# Patient Record
Sex: Female | Born: 1945 | Race: White | Hispanic: No | Smoking: Never smoker
Health system: Southern US, Community
[De-identification: ages and names within clinical notes are randomized; demographics above are authoritative.]

## PROBLEM LIST (undated history)

## (undated) DIAGNOSIS — E119 Type 2 diabetes mellitus without complications: Secondary | ICD-10-CM

## (undated) DIAGNOSIS — K8689 Other specified diseases of pancreas: Secondary | ICD-10-CM

## (undated) DIAGNOSIS — D649 Anemia, unspecified: Secondary | ICD-10-CM

## (undated) DIAGNOSIS — T783XXA Angioneurotic edema, initial encounter: Secondary | ICD-10-CM

## (undated) DIAGNOSIS — M1711 Unilateral primary osteoarthritis, right knee: Secondary | ICD-10-CM

## (undated) DIAGNOSIS — Z9889 Other specified postprocedural states: Secondary | ICD-10-CM

## (undated) DIAGNOSIS — R350 Frequency of micturition: Secondary | ICD-10-CM

## (undated) DIAGNOSIS — N393 Stress incontinence (female) (male): Secondary | ICD-10-CM

## (undated) DIAGNOSIS — K219 Gastro-esophageal reflux disease without esophagitis: Secondary | ICD-10-CM

## (undated) DIAGNOSIS — M1990 Unspecified osteoarthritis, unspecified site: Secondary | ICD-10-CM

## (undated) DIAGNOSIS — T7840XA Allergy, unspecified, initial encounter: Secondary | ICD-10-CM

## (undated) DIAGNOSIS — Z973 Presence of spectacles and contact lenses: Secondary | ICD-10-CM

## (undated) DIAGNOSIS — I499 Cardiac arrhythmia, unspecified: Secondary | ICD-10-CM

## (undated) DIAGNOSIS — R112 Nausea with vomiting, unspecified: Secondary | ICD-10-CM

## (undated) DIAGNOSIS — R3915 Urgency of urination: Secondary | ICD-10-CM

## (undated) DIAGNOSIS — I10 Essential (primary) hypertension: Secondary | ICD-10-CM

## (undated) HISTORY — PX: JOINT REPLACEMENT: SHX530

## (undated) HISTORY — DX: Allergy, unspecified, initial encounter: T78.40XA

## (undated) HISTORY — PX: COLONOSCOPY W/ BIOPSIES AND POLYPECTOMY: SHX1376

## (undated) HISTORY — DX: Angioneurotic edema, initial encounter: T78.3XXA

## (undated) HISTORY — PX: DILATION AND CURETTAGE OF UTERUS: SHX78

## (undated) HISTORY — DX: Gastro-esophageal reflux disease without esophagitis: K21.9

## (undated) HISTORY — DX: Essential (primary) hypertension: I10

## (undated) HISTORY — DX: Unspecified osteoarthritis, unspecified site: M19.90

## (undated) HISTORY — DX: Unilateral primary osteoarthritis, right knee: M17.11

---

## 2001-09-05 ENCOUNTER — Encounter: Payer: Self-pay | Admitting: Obstetrics and Gynecology

## 2001-09-05 ENCOUNTER — Encounter: Admission: RE | Admit: 2001-09-05 | Discharge: 2001-09-05 | Payer: Self-pay | Admitting: Obstetrics and Gynecology

## 2001-09-07 ENCOUNTER — Other Ambulatory Visit: Admission: RE | Admit: 2001-09-07 | Discharge: 2001-09-07 | Payer: Self-pay | Admitting: Obstetrics and Gynecology

## 2001-09-11 ENCOUNTER — Encounter: Admission: RE | Admit: 2001-09-11 | Discharge: 2001-09-11 | Payer: Self-pay | Admitting: Obstetrics and Gynecology

## 2001-09-11 ENCOUNTER — Encounter: Payer: Self-pay | Admitting: Obstetrics and Gynecology

## 2002-10-11 ENCOUNTER — Encounter: Payer: Self-pay | Admitting: Obstetrics and Gynecology

## 2002-10-11 ENCOUNTER — Encounter: Admission: RE | Admit: 2002-10-11 | Discharge: 2002-10-11 | Payer: Self-pay | Admitting: Obstetrics and Gynecology

## 2002-10-24 ENCOUNTER — Other Ambulatory Visit: Admission: RE | Admit: 2002-10-24 | Discharge: 2002-10-24 | Payer: Self-pay | Admitting: Obstetrics and Gynecology

## 2004-11-04 ENCOUNTER — Other Ambulatory Visit: Admission: RE | Admit: 2004-11-04 | Discharge: 2004-11-04 | Payer: Self-pay | Admitting: Obstetrics and Gynecology

## 2005-02-01 ENCOUNTER — Encounter: Admission: RE | Admit: 2005-02-01 | Discharge: 2005-02-01 | Payer: Self-pay | Admitting: Obstetrics and Gynecology

## 2005-10-05 ENCOUNTER — Encounter: Admission: RE | Admit: 2005-10-05 | Discharge: 2005-10-05 | Payer: Self-pay | Admitting: Emergency Medicine

## 2007-10-02 ENCOUNTER — Encounter: Admission: RE | Admit: 2007-10-02 | Discharge: 2007-10-02 | Payer: Self-pay | Admitting: Obstetrics and Gynecology

## 2008-03-22 HISTORY — PX: TOTAL KNEE ARTHROPLASTY: SHX125

## 2008-08-14 ENCOUNTER — Emergency Department (HOSPITAL_COMMUNITY): Admission: EM | Admit: 2008-08-14 | Discharge: 2008-08-15 | Payer: Self-pay | Admitting: Emergency Medicine

## 2008-10-07 ENCOUNTER — Inpatient Hospital Stay (HOSPITAL_COMMUNITY): Admission: RE | Admit: 2008-10-07 | Discharge: 2008-10-09 | Payer: Self-pay | Admitting: Orthopedic Surgery

## 2010-04-11 ENCOUNTER — Encounter: Payer: Self-pay | Admitting: Obstetrics and Gynecology

## 2010-06-28 LAB — ABO/RH: ABO/RH(D): A NEG

## 2010-06-28 LAB — URINE CULTURE
Colony Count: NO GROWTH
Culture: NO GROWTH

## 2010-06-28 LAB — TYPE AND SCREEN

## 2010-06-28 LAB — URINALYSIS, ROUTINE W REFLEX MICROSCOPIC
Glucose, UA: NEGATIVE mg/dL
Hgb urine dipstick: NEGATIVE
Ketones, ur: NEGATIVE mg/dL
Protein, ur: NEGATIVE mg/dL
pH: 7.5 (ref 5.0–8.0)

## 2010-06-28 LAB — CBC
HCT: 30.4 % — ABNORMAL LOW (ref 36.0–46.0)
HCT: 40.7 % (ref 36.0–46.0)
Hemoglobin: 10.4 g/dL — ABNORMAL LOW (ref 12.0–15.0)
Hemoglobin: 10.5 g/dL — ABNORMAL LOW (ref 12.0–15.0)
Hemoglobin: 14 g/dL (ref 12.0–15.0)
MCHC: 35.3 g/dL (ref 30.0–36.0)
MCV: 85.8 fL (ref 78.0–100.0)
RBC: 3.45 MIL/uL — ABNORMAL LOW (ref 3.87–5.11)
RBC: 4.69 MIL/uL (ref 3.87–5.11)
RDW: 13.5 % (ref 11.5–15.5)
WBC: 10 10*3/uL (ref 4.0–10.5)
WBC: 12.2 10*3/uL — ABNORMAL HIGH (ref 4.0–10.5)

## 2010-06-28 LAB — BASIC METABOLIC PANEL
CO2: 30 mEq/L (ref 19–32)
Calcium: 8.3 mg/dL — ABNORMAL LOW (ref 8.4–10.5)
Chloride: 103 mEq/L (ref 96–112)
GFR calc Af Amer: >60 mL/min (ref >60)
GFR calc Af Amer: >60 mL/min (ref >60)
GFR calc non Af Amer: >60 mL/min (ref >60)
Potassium: 4.7 mEq/L (ref 3.5–5.1)
Sodium: 139 mEq/L (ref 135–145)
Sodium: 139 mEq/L (ref 135–145)

## 2010-06-28 LAB — DIFFERENTIAL
Basophils Absolute: 0.1 10*3/uL (ref 0.0–0.1)
Basophils Relative: 1 % (ref 0–1)
Eosinophils Absolute: 0.1 10*3/uL (ref 0.0–0.7)
Monocytes Relative: 6 % (ref 3–12)
Neutro Abs: 6.5 10*3/uL (ref 1.7–7.7)
Neutrophils Relative %: 74 % (ref 43–77)

## 2010-06-28 LAB — COMPREHENSIVE METABOLIC PANEL
ALT: 17 U/L (ref 0–35)
Alkaline Phosphatase: 88 U/L (ref 39–117)
BUN: 10 mg/dL (ref 6–23)
CO2: 29 mEq/L (ref 19–32)
GFR calc non Af Amer: >60 mL/min (ref >60)
Glucose, Bld: 102 mg/dL — ABNORMAL HIGH (ref 70–99)
Potassium: 4.4 mEq/L (ref 3.5–5.1)
Total Bilirubin: 0.6 mg/dL (ref 0.3–1.2)
Total Protein: 6.6 g/dL (ref 6.0–8.3)

## 2010-06-28 LAB — PROTIME-INR
INR: 2 — ABNORMAL HIGH (ref 0.00–1.49)
Prothrombin Time: 13.1 seconds (ref 11.6–15.2)

## 2010-06-28 LAB — HEMOGLOBIN A1C
Hgb A1c MFr Bld: 5.9 % (ref 4.6–6.1)
Mean Plasma Glucose: 123 mg/dL

## 2010-06-30 LAB — DIFFERENTIAL
Basophils Absolute: 0 10*3/uL (ref 0.0–0.1)
Basophils Relative: 0 % (ref 0–1)
Lymphocytes Relative: 14 % (ref 12–46)
Monocytes Absolute: 0.8 10*3/uL (ref 0.1–1.0)
Neutro Abs: 8.5 10*3/uL — ABNORMAL HIGH (ref 1.7–7.7)
Neutrophils Relative %: 78 % — ABNORMAL HIGH (ref 43–77)

## 2010-06-30 LAB — BASIC METABOLIC PANEL
CO2: 27 mEq/L (ref 19–32)
Calcium: 9.2 mg/dL (ref 8.4–10.5)
Creatinine, Ser: 0.83 mg/dL (ref 0.4–1.2)
GFR calc Af Amer: >60 mL/min (ref >60)
GFR calc non Af Amer: >60 mL/min (ref >60)
Glucose, Bld: 109 mg/dL — ABNORMAL HIGH (ref 70–99)
Sodium: 138 mEq/L (ref 135–145)

## 2010-06-30 LAB — POCT CARDIAC MARKERS
CKMB, poc: <1 ng/mL — ABNORMAL LOW (ref 1.0–8.0)
Myoglobin, poc: 39.2 ng/mL (ref 12–200)

## 2010-06-30 LAB — CBC
Hemoglobin: 12.8 g/dL (ref 12.0–15.0)
MCHC: 35.3 g/dL (ref 30.0–36.0)
RBC: 4.23 MIL/uL (ref 3.87–5.11)
RDW: 13.1 % (ref 11.5–15.5)

## 2010-08-04 NOTE — Op Note (Signed)
NAMEHASANA, ALCORTA            ACCOUNT NO.:  [id number]   MEDICAL RECORD NO.:  [id number]          PATIENT TYPE:  INP   LOCATION:  5041                         FACILITY:  [facility]   PHYSICIAN:  Elana Alm. Thurston Hole, M.D. DATE OF BIRTH:  11-09-1945   DATE OF PROCEDURE:  10/07/2008  DATE OF DISCHARGE:                               OPERATIVE REPORT   PREOPERATIVE DIAGNOSIS:  Left knee degenerative joint disease.   POSTOPERATIVE DIAGNOSIS:  Left knee degenerative joint disease.   PROCEDURE:  Left total knee replacement using DePuy cemented total knee  system with #2.5 cemented femur, #2.5 cemented tibia with 12.5-mm  polyethylene RP tibial spacer, and 35-mm polyethylene cemented patella.   SURGEON:  Elana Alm. Thurston Hole, MD   ASSISTANT:  Julien Girt, PA   ANESTHESIA:  General.   OPERATIVE TIME:  One hour and 30 minutes.   COMPLICATIONS:  None.   DESCRIPTION OF PROCEDURE:  Ms. Deblasi is brought to the operating room  on October 07, 2008, after a femoral nerve block was placed in the holding  by Anesthesia.  She was placed on the operative table in supine  position.  She received Ancef 2 g IV preoperatively for prophylaxis.  After being placed under general anesthesia, she had a Foley catheter  placed under sterile conditions.  Her left knee was examined.  Range of  motion from -5 to 125 degrees.  Moderate varus deformity in knee.  Stable ligamentous exam with normal patellar tracking.  Left leg was  prepped using sterile DuraPrep and draped using sterile technique.  Time-  out technique was called and utilized judiciously.  At this point then,  the leg was exsanguinated and tourniquet elevated to 365 mm.  Initially,  through a 15-cm longitudinal incision based over the patella, initial  exposure was made.  The underlying subcutaneous tissues were incised  along with skin incision.  A median arthrotomy was performed revealing  excessive amount of normal-appearing joint fluid.  The  articular  surfaces were inspected.  She was found to have grade 4 changes  medially, grade 3 and 4 changes laterally, and grade 3 and 4 changes in  the patellofemoral joint.  Osteophytes were removed from the femoral  condyles and tibial plateau.  The medial and lateral meniscal remnants  were removed as well as the anterior cruciate ligament.  Intramedullary  drill was then drilled up the femoral canal for placement of distal  femoral cutting jig which was placed in the appropriate manner of  rotation.  A distal 11-mm cut was made.  The distal femur was incised.  The #2.5 was found be the appropriate size.  The #2.5 cutting jig was  placed on the distal femur in the appropriate manner of external  rotation and then these cuts were made.  The proximal tibia was then  exposed.  The tibial spines were removed with an oscillating saw.  Intramedullary drill was drilled down the tibial canal for placement of  the proximal tibial cutting jig which was placed in the appropriate  manner of rotation, and a proximal 6-mm cut was made based off the  medial  or lower side.  Spacer blocks were then placed in flexion and  extension.  A 2.5-mm blocks gave excellent stability, excellent  balancing, and excellent correction of her flexion and varus  deformities.  At this point, the #2.5 tibial base plate trial was placed  on the cut tibial surface with an excellent fit and a keel cut was made.  The PCL box cutter was then placed on the distal femur and these cuts  were made.  At this point, the #2.5 femoral trial was placed, and with  the #2.5 tibial base plate trial and a 12.5-mm polyethylene RP tibial  spacer, knee was reduced and taken through range of motion from 0-125  degrees with excellent stability and excellent correction of her flexion  and varus deformities and normal patellar tracking.  An 8-mm resurfacing  cut was then made on the patella and 3 locking holes placed for a 35-mm  patellar  trial.  The patellar trial was placed, and again patellofemoral  tracking was evaluated and found to be normal.  At this point, it was  felt that all the trial components were excellent size, fit, and  stability.  They were then removed.  The knee was then jet lavaged and  irrigated with 3 L of saline.  The proximal tibia was then exposed.  The  #2.5 tibial baseplate with cemented back and was hammered into position  with an excellent fit with excess cement being removed from around the  edges.  The #2.5 femoral component was cemented back and was hammered  into position also with an excellent fit with excess cement being  removed from around the edges.  The 12.5-mm polyethylene RP tibial  spacer was placed on the tibial baseplate and the knee reduced, taken  through a range of motion from 0-125 degrees with excellent stability  and excellent correction of her flexion and varus deformities.  The 35-  mm polyethylene cemented back to the patella, it was then placed in its  position and held there with a clamp.  After the cement hardened again,  patellofemoral tracking was evaluated and found to be normal.  At this  point, it was felt that all the components were of excellent size, fit,  and stability.  The wound was further irrigated with saline.  The  tourniquet was then released.  Hemostasis obtained with cautery.  The  arthrotomy was then closed with #1 Ethibond suture over 2 medium Hemovac  drains.  Subcutaneous tissues closed with 0 and 2-0 Vicryl.  Subcuticular layer closed with 4-0 Monocryl.  Sterile dressings and long-  leg splint applied.  The patient then awakened, extubated, and taken to  recovery room in stable condition.  Needle and sponge count was correct  x2 at the end of the case.  Neurovascular status normal postoperatively  as well.      Robert A. Thurston Hole, M.D.  Electronically Signed     RAW/MEDQ  D:  10/07/2008  T:  10/08/2008  Job:  [id number]

## 2010-09-28 ENCOUNTER — Other Ambulatory Visit: Payer: Self-pay | Admitting: Family Medicine

## 2010-09-28 DIAGNOSIS — Z1231 Encounter for screening mammogram for malignant neoplasm of breast: Secondary | ICD-10-CM

## 2010-10-06 ENCOUNTER — Ambulatory Visit
Admission: RE | Admit: 2010-10-06 | Discharge: 2010-10-06 | Disposition: A | Discharge status: Home / Self Care | Payer: BC Managed Care – PPO | Source: Ambulatory Visit | Attending: Family Medicine | Admitting: Family Medicine

## 2010-10-06 DIAGNOSIS — Z1231 Encounter for screening mammogram for malignant neoplasm of breast: Secondary | ICD-10-CM

## 2010-11-26 ENCOUNTER — Ambulatory Visit (INDEPENDENT_AMBULATORY_CARE_PROVIDER_SITE_OTHER): Payer: Medicare Other | Admitting: Family Medicine

## 2010-11-26 ENCOUNTER — Encounter: Payer: Self-pay | Admitting: Family Medicine

## 2010-11-26 DIAGNOSIS — Z13220 Encounter for screening for lipoid disorders: Secondary | ICD-10-CM

## 2010-11-26 DIAGNOSIS — Z0000 Encounter for general adult medical examination without abnormal findings: Secondary | ICD-10-CM | POA: Insufficient documentation

## 2010-11-26 DIAGNOSIS — E785 Hyperlipidemia, unspecified: Secondary | ICD-10-CM | POA: Insufficient documentation

## 2010-11-26 DIAGNOSIS — R030 Elevated blood-pressure reading, without diagnosis of hypertension: Secondary | ICD-10-CM

## 2010-11-26 DIAGNOSIS — Z1211 Encounter for screening for malignant neoplasm of colon: Secondary | ICD-10-CM | POA: Insufficient documentation

## 2010-11-26 DIAGNOSIS — I10 Essential (primary) hypertension: Secondary | ICD-10-CM | POA: Insufficient documentation

## 2010-11-26 DIAGNOSIS — F329 Major depressive disorder, single episode, unspecified: Secondary | ICD-10-CM | POA: Insufficient documentation

## 2010-11-26 LAB — BASIC METABOLIC PANEL
CO2: 28 mEq/L (ref 19–32)
Chloride: 104 mEq/L (ref 96–112)
Glucose, Bld: 148 mg/dL — ABNORMAL HIGH (ref 70–99)
Potassium: 4.6 mEq/L (ref 3.5–5.1)
Sodium: 141 mEq/L (ref 135–145)

## 2010-11-26 LAB — CBC WITH DIFFERENTIAL/PLATELET
Basophils Absolute: 0 10*3/uL (ref 0.0–0.1)
Eosinophils Relative: 2 % (ref 0.0–5.0)
HCT: 38.9 % (ref 36.0–46.0)
Hemoglobin: 13.1 g/dL (ref 12.0–15.0)
Lymphocytes Relative: 16.8 % (ref 12.0–46.0)
Monocytes Relative: 6.1 % (ref 3.0–12.0)
Platelets: 261 10*3/uL (ref 150.0–400.0)
RDW: 13.6 % (ref 11.5–14.6)
WBC: 7.8 10*3/uL (ref 4.5–10.5)

## 2010-11-26 LAB — HEPATIC FUNCTION PANEL
ALT: 14 U/L (ref 0–35)
AST: 18 U/L (ref 0–37)
Albumin: 4.1 g/dL (ref 3.5–5.2)
Alkaline Phosphatase: 85 U/L (ref 39–117)
Bilirubin, Direct: 0.1 mg/dL (ref 0.0–0.3)
Total Protein: 7.5 g/dL (ref 6.0–8.3)

## 2010-11-26 LAB — LIPID PANEL
Total CHOL/HDL Ratio: 7
Triglycerides: 290 mg/dL — ABNORMAL HIGH (ref 0.0–149.0)

## 2010-11-26 LAB — LDL CHOLESTEROL, DIRECT: Direct LDL: 182.4 mg/dL

## 2010-11-26 NOTE — Patient Instructions (Signed)
We'll let you know about your lab results Someone will call you with your GI appt Your exam looks great!  Keep up the good work! Call Dr Pennie Rushing and touch base w/ the GYN screening Call with any questions or concerns Keep an eye on the depression- if you need me, call me! HAPPY EARLY BIRTHDAY!!!!

## 2010-11-26 NOTE — Progress Notes (Signed)
  Subjective:    Patient ID: Tracy Williams, female    DOB: 04-23-1945, 65 y.o.   MRN: [id number]  HPI New to establish- no previous PCP.  Has GYN- [facility]  Here today for Welcome to Medicare CPE.  Risk Factors: Elevated BP- pt reports this has been an issue since knee surgery in 2010.  Was told this was due to pain.  At the time was started on 12.5 mg HCTZ and was unable to tolerate it due to vomiting. Physical Activity: difficulty exercising due to knee problems, recently completed paperwork for [employer]- has pool now available Fall risk: low risk Depression: admits to current sxs due to husband's illnesses and her need to provide care. poor sleep, tearful.  Having financial difficulties.  Would prefer not to start meds. Hearing: normal to whispered voice at 6 ft ADL's: independent Cognitive: normal linear thought process, memory and attention intact Home Safety: safe at home, lives w/ husband.  Son lives in [place] Height, Weight, BMI, Visual Acuity: see vitals, vision corrected to 20/20 w/ glasses Counseling: has never had colonoscopy, UTD on mammogram, overdue for pap.  Due for DEXA.  Has healthcare POA and living will. Labs Ordered: See A&P Care Plan: See A&P    Review of Systems Patient reports no vision/ hearing changes, adenopathy,fever, weight change,  persistant/recurrent hoarseness , swallowing issues, chest pain, palpitations, edema, persistant/recurrent cough, hemoptysis, dyspnea (rest/exertional/paroxysmal nocturnal), gastrointestinal bleeding (melena, rectal bleeding), abdominal pain, significant heartburn, bowel changes, GU symptoms (dysuria, hematuria, incontinence), Gyn symptoms (abnormal  bleeding, pain),  syncope, focal weakness, memory loss, numbness & tingling, skin/hair/nail changes, abnormal bruising or bleeding.     Objective:   Physical Exam  General Appearance:    Alert, cooperative, no distress, appears stated age  Head:    Normocephalic,  without obvious abnormality, atraumatic  Eyes:    PERRL, conjunctiva/corneas clear, EOM's intact, fundi    benign, both eyes  Ears:    Normal TM's and external ear canals, both ears  Nose:   Nares normal, septum midline, mucosa normal, no drainage    or sinus tenderness  Throat:   Lips, mucosa, and tongue normal; teeth and gums normal  Neck:   Supple, symmetrical, trachea midline, no adenopathy;    Thyroid: no enlargement/tenderness/nodules  Back:     Symmetric, no curvature, ROM normal, no CVA tenderness  Lungs:     Clear to auscultation bilaterally, respirations unlabored  Chest Wall:    No tenderness or deformity   Heart:    Regular rate and rhythm, S1 and S2 normal, no murmur, rub   or gallop  Breast Exam:    Deferred to GYN  Abdomen:     Soft, non-tender, bowel sounds active all four quadrants,    no masses, no organomegaly  Genitalia:    Deferred to gyn  Rectal:    Extremities:   Extremities normal, atraumatic, no cyanosis or edema  Pulses:   2+ and symmetric all extremities  Skin:   Skin color, texture, turgor normal, no rashes or lesions  Lymph nodes:   Cervical, supraclavicular, and axillary nodes normal  Neurologic:   CNII-XII intact, normal strength, sensation and reflexes    throughout          Assessment & Plan:

## 2010-11-27 ENCOUNTER — Telehealth: Payer: Self-pay

## 2010-11-27 LAB — HEMOGLOBIN A1C: Hgb A1c MFr Bld: 6.9 % — ABNORMAL HIGH (ref 4.6–6.5)

## 2010-11-27 MED ORDER — ATORVASTATIN CALCIUM 40 MG PO TABS: 40.0000 mg | ORAL_TABLET | Freq: Every day | ORAL | Status: DC

## 2010-11-27 MED ORDER — METFORMIN HCL 500 MG PO TABS: 500.0000 mg | ORAL_TABLET | Freq: Two times a day (BID) | ORAL | Status: DC

## 2010-11-27 NOTE — Telephone Encounter (Signed)
Pt notified and verbalized understanding. Scripts sent to pharmacy

## 2010-11-27 NOTE — Telephone Encounter (Signed)
Message copied by Beverely Low on Fri Nov 27, 2010 11:15 AM ------      Message from: Sheliah Hatch      Created: Fri Nov 27, 2010 11:06 AM       Pt is officially diabetic based on these labs.  She needs to start Metformin 500mg  bid and pay close attention to her carb intake (sugars, starches).      Her total cholesterol, LDL, and triglycerides are all elevated, too.  Based on this she needs to start Lipitor 40mg  nightly and recheck LFTs 6-8 weeks after starting meds (should start the cholesterol meds 2 weeks after starting Metformin so she is not starting 2 meds at the same time)

## 2010-11-29 LAB — VITAMIN D 1,25 DIHYDROXY
Vitamin D 1, 25 (OH)2 Total: 38 pg/mL (ref 18–72)
Vitamin D2 1, 25 (OH)2: <8 pg/mL
Vitamin D3 1, 25 (OH)2: 38 pg/mL

## 2010-11-30 ENCOUNTER — Telehealth: Payer: Self-pay

## 2010-11-30 NOTE — Telephone Encounter (Signed)
Message copied by Beverely Low on Mon Nov 30, 2010  9:40 AM ------      Message from: Sheliah Hatch      Created: Sun Nov 29, 2010  8:06 PM       Normal Vit D level

## 2010-11-30 NOTE — Telephone Encounter (Signed)
Pt aware.

## 2010-12-06 NOTE — Assessment & Plan Note (Signed)
Refer to GI for routine colonoscopy

## 2010-12-06 NOTE — Assessment & Plan Note (Signed)
Check labs.  Start meds prn. 

## 2010-12-06 NOTE — Assessment & Plan Note (Addendum)
Pt's PE WNL.  EKG performed as baseline- WNL.  Will defer to GYN on pap, mammo, and DEXA.  Referred to GI for colonoscopy.  Pt has POA and living will.  Check labs.  Anticipatory guidance provided.

## 2010-12-06 NOTE — Assessment & Plan Note (Signed)
Pt admits to depressive sxs due to her husband's illness.  Not interested in starting meds at this time.  Recommended counseling as an outlet for her emotional stress.  Pt to consider this.  Will follow closely.

## 2010-12-06 NOTE — Assessment & Plan Note (Signed)
Based on hx it sounds as if pt was hypertensive in the past and started on meds.  Has been off meds for 'years' after not tolerating HCTZ.  BP is mildly elevated at this time- will need close f/u to determine if meds are again required.  Pt expressed understanding and is in agreement w/ plan.

## 2010-12-09 ENCOUNTER — Telehealth: Payer: Self-pay | Admitting: Family Medicine

## 2010-12-09 NOTE — Telephone Encounter (Signed)
Message copied by Duaine Dredge on Wed Dec 09, 2010 10:17 AM ------      Message from: Sheliah Hatch      Created: Fri Nov 27, 2010  8:01 AM      Regarding: driving evaluation       Please provide pt and wife w/ # of driving evaluation center- DMV is requiring pt to have this            Thanks!      KT      ----- Message -----         From: Candie Echevaria, CMA         Sent: 11/26/2010   4:25 PM           To: Neena Rhymes, MD            Pt states that she needs number for driving evaluation.

## 2010-12-09 NOTE — Telephone Encounter (Signed)
Per flag sent, Tracy Williams, is the patient.  I have spoken with her, and provided her with contact information to [facility], ran by [employer], phone [phone].  Patient info was also faxed to attn Harriett Sine at [facility], they will be contacting patient.

## 2010-12-15 ENCOUNTER — Telehealth: Payer: Self-pay | Admitting: Family Medicine

## 2010-12-15 MED ORDER — METFORMIN HCL ER 500 MG PO TB24: 500.0000 mg | ORAL_TABLET | Freq: Every day | ORAL | Status: DC

## 2010-12-15 NOTE — Telephone Encounter (Signed)
Patient started taking metformin recently - she said it makes her nauseous - [facility]

## 2010-12-15 NOTE — Telephone Encounter (Signed)
We could switch her to extended release Metformin which would last 24 hrs and she could take this at night but this is more expensive than the twice daily med.  This typically improves after 2 weeks of taking med.  If she can give it 2 weeks we can discuss alternatives if it doesn't improve.

## 2010-12-15 NOTE — Telephone Encounter (Signed)
Discussed with patient and I advised that nausea can be a reaction to medications she stated that she does not like feeling sick during the day and wanted to know if she can she either take them both at nigh or f she just not take the medication and just watch diet and exercise. I advised he she will to get her diabetes under control and then she can discuss with Dr.abori about coming of if meds but right now she needs the Metformin. Patient voiced understanding and wanted to know  If she can either get something for the nausea or take to pills at bedtime. Please advise     KP

## 2010-12-15 NOTE — Telephone Encounter (Signed)
Discussed with patient and she agreed to try the 500 er-- Rx faxed to [facility]    KP

## 2011-03-19 ENCOUNTER — Encounter: Payer: Self-pay | Admitting: Family Medicine

## 2011-03-19 ENCOUNTER — Ambulatory Visit (INDEPENDENT_AMBULATORY_CARE_PROVIDER_SITE_OTHER): Payer: Medicare Other | Admitting: Family Medicine

## 2011-03-19 VITALS — BP 118/70 | HR 90 | Temp 99.3°F | Ht 64.5 in | Wt 172.4 lb

## 2011-03-19 DIAGNOSIS — J40 Bronchitis, not specified as acute or chronic: Secondary | ICD-10-CM | POA: Insufficient documentation

## 2011-03-19 MED ORDER — BENZONATATE 200 MG PO CAPS: 200.0000 mg | ORAL_CAPSULE | Freq: Three times a day (TID) | ORAL | Status: AC | PRN

## 2011-03-19 MED ORDER — GUAIFENESIN-CODEINE 100-10 MG/5ML PO SYRP: 5.0000 mL | ORAL_SOLUTION | Freq: Three times a day (TID) | ORAL | Status: AC | PRN

## 2011-03-19 MED ORDER — AZITHROMYCIN 250 MG PO TABS: 250.0000 mg | ORAL_TABLET | Freq: Every day | ORAL | Status: AC

## 2011-03-19 NOTE — Patient Instructions (Signed)
This is a bronchitis Start the Azithromycin as directed Use the cough meds as needed Add Mucinex to thin your congestion Drink plenty of fluids! REST!!! Hang in there!! Happy New Year!!!

## 2011-03-19 NOTE — Progress Notes (Signed)
  Subjective:    Patient ID: Stephens Shire, female    DOB: [birth year], 65 y.o.   MRN: [id number]  HPI URI- sxs started Sunday, 'Wednesday I wanted to die'.  Started w/ bad HA, cough is productive of yellow sputum.  + fevers, chills, body aches.  Denies facial pain, ear pain.  + nasal congestion.  + sick contacts- family all w/ similar URI, they all tested (-) for flu.  + laryngitis.   Review of Systems For ROS see HPI     Objective:   Physical Exam  Vitals reviewed. Constitutional: She appears well-developed and well-nourished. No distress.  HENT:  Head: Normocephalic and atraumatic.       TMs normal bilaterally Mild nasal congestion Throat w/out erythema, edema, or exudate  Eyes: Conjunctivae and EOM are normal. Pupils are equal, round, and reactive to light.  Neck: Normal range of motion. Neck supple.  Cardiovascular: Normal rate, regular rhythm, normal heart sounds and intact distal pulses.   No murmur heard. Pulmonary/Chest: Effort normal and breath sounds normal. No respiratory distress. She has no wheezes.       + hacking cough  Lymphadenopathy:    She has no cervical adenopathy.          Assessment & Plan:

## 2011-03-21 NOTE — Assessment & Plan Note (Signed)
Given severity of sxs will start abx.  Cough meds prn.  Since family members w/ similar illness tested negative for flu, will not start Tamiflu.  Reviewed supportive care and red flags that should prompt return.  Pt expressed understanding and is in agreement w/ plan.

## 2011-06-28 LAB — HM COLONOSCOPY: HM Colonoscopy: NORMAL

## 2011-10-06 ENCOUNTER — Other Ambulatory Visit: Payer: Self-pay | Admitting: Family Medicine

## 2011-10-06 ENCOUNTER — Telehealth: Payer: Self-pay | Admitting: Family Medicine

## 2011-10-06 DIAGNOSIS — Z1231 Encounter for screening mammogram for malignant neoplasm of breast: Secondary | ICD-10-CM

## 2011-10-06 DIAGNOSIS — Z780 Asymptomatic menopausal state: Secondary | ICD-10-CM

## 2011-10-06 NOTE — Telephone Encounter (Signed)
Patient is requesting a bine density test, can you review and advise Pt. Cb# [phone]

## 2011-10-07 NOTE — Telephone Encounter (Signed)
Please advise 

## 2011-10-07 NOTE — Telephone Encounter (Signed)
Typically GYN refers for this.  If GYN has not done this w/in last 2 yrs, agree that pt needs one ordered and will be happy to do so.  Often these are done at same time as mammo.

## 2011-10-08 NOTE — Telephone Encounter (Signed)
Called pt to advise, pt notes that she set up her own mammogram and that [facility] (breast center) advised per her apt is next week for the mammo that she will have to have the bone density on a separate day, pt understood and an order was placed in pt chart per verbal order from MD Tabori to place in chart, pt understood that someone will call from this office with apt for bone density

## 2011-10-14 ENCOUNTER — Ambulatory Visit: Payer: Medicare Other

## 2011-10-15 ENCOUNTER — Other Ambulatory Visit: Payer: Medicare Other

## 2011-10-26 ENCOUNTER — Ambulatory Visit
Admission: RE | Admit: 2011-10-26 | Discharge: 2011-10-26 | Disposition: A | Discharge status: Home / Self Care | Payer: Medicare Other | Source: Ambulatory Visit | Attending: Family Medicine | Admitting: Family Medicine

## 2011-10-26 DIAGNOSIS — Z780 Asymptomatic menopausal state: Secondary | ICD-10-CM

## 2011-10-26 DIAGNOSIS — Z13820 Encounter for screening for osteoporosis: Secondary | ICD-10-CM | POA: Diagnosis not present

## 2011-10-26 DIAGNOSIS — Z1231 Encounter for screening mammogram for malignant neoplasm of breast: Secondary | ICD-10-CM

## 2011-11-02 ENCOUNTER — Telehealth: Payer: Self-pay | Admitting: *Deleted

## 2011-11-02 MED ORDER — ALENDRONATE SODIUM 70 MG PO TABS: 70.0000 mg | ORAL_TABLET | ORAL | Status: DC

## 2011-11-02 NOTE — Telephone Encounter (Signed)
Spoke to pt to advise results/instructions. Pt understood for dexa scan notations as follows:  Pt w/ osteoporosis on recent DEXA. Should start Fosamax 70mg  weekly in addition to 1200 Ca + 800 Vit D daily. If she has questions or concerns about this, please schedule appt.      Sent new RX to [facility] per pt request, pt offered apt to discuss, pt declined, will also start OTC.

## 2011-11-09 ENCOUNTER — Telehealth: Payer: Self-pay | Admitting: *Deleted

## 2011-11-09 NOTE — Telephone Encounter (Signed)
Called Tracy Williams to advise recent results for bone density scan noting that she has Osteoporosis, MD Beverely Low noted Tracy Williams already taking Fosamax and needs to add Calcium 1200u and VitD 800u daily, which can be accomplished by taking 2 OTC Caltrate daily, left vm for Tracy Williams to call office

## 2011-11-16 NOTE — Telephone Encounter (Signed)
Pt aware and will start fosamax soon with the caltrate

## 2011-11-19 ENCOUNTER — Encounter: Payer: Self-pay | Admitting: Family Medicine

## 2011-12-30 ENCOUNTER — Telehealth: Payer: Self-pay | Admitting: Family Medicine

## 2011-12-30 MED ORDER — ATORVASTATIN CALCIUM 40 MG PO TABS: 40.0000 mg | ORAL_TABLET | Freq: Every day | ORAL | Status: DC

## 2011-12-30 NOTE — Telephone Encounter (Signed)
Refill: Atorvastatin calcium 40mg  tab #90. Take 1 tablet by mouth once daily. Last fill 8.16.13

## 2011-12-30 NOTE — Telephone Encounter (Signed)
rx sent to pharmacy by e-script Letter has been mailed to pt address noted in the chart to advise they are overdue for cpe/ov/labs and the pt needs to contact office to set up appt  Pt needs CPE with fasting labs

## 2012-05-01 DIAGNOSIS — M1710 Unilateral primary osteoarthritis, unspecified knee: Secondary | ICD-10-CM | POA: Diagnosis not present

## 2012-05-04 ENCOUNTER — Other Ambulatory Visit: Payer: Medicare Other

## 2012-05-26 ENCOUNTER — Encounter (HOSPITAL_COMMUNITY)
Admission: RE | Disposition: A | Discharge status: Home Health | Payer: Self-pay | Source: Ambulatory Visit | Attending: Orthopedic Surgery

## 2012-05-26 SURGERY — ARTHROPLASTY, KNEE, TOTAL
Anesthesia: General | Laterality: Right

## 2012-06-09 ENCOUNTER — Encounter: Payer: Self-pay | Admitting: Family Medicine

## 2012-06-09 ENCOUNTER — Ambulatory Visit (INDEPENDENT_AMBULATORY_CARE_PROVIDER_SITE_OTHER): Payer: Medicare Other | Admitting: Family Medicine

## 2012-06-09 VITALS — BP 142/80 | HR 90 | Temp 98.4°F | Ht 65.25 in | Wt 177.4 lb

## 2012-06-09 DIAGNOSIS — Z13220 Encounter for screening for lipoid disorders: Secondary | ICD-10-CM

## 2012-06-09 DIAGNOSIS — R030 Elevated blood-pressure reading, without diagnosis of hypertension: Secondary | ICD-10-CM | POA: Diagnosis not present

## 2012-06-09 DIAGNOSIS — M810 Age-related osteoporosis without current pathological fracture: Secondary | ICD-10-CM | POA: Insufficient documentation

## 2012-06-09 DIAGNOSIS — Z794 Long term (current) use of insulin: Secondary | ICD-10-CM | POA: Insufficient documentation

## 2012-06-09 DIAGNOSIS — E118 Type 2 diabetes mellitus with unspecified complications: Secondary | ICD-10-CM | POA: Insufficient documentation

## 2012-06-09 DIAGNOSIS — IMO0001 Reserved for inherently not codable concepts without codable children: Secondary | ICD-10-CM

## 2012-06-09 DIAGNOSIS — Z0000 Encounter for general adult medical examination without abnormal findings: Secondary | ICD-10-CM | POA: Diagnosis not present

## 2012-06-09 LAB — LIPID PANEL
Cholesterol: 254 mg/dL — ABNORMAL HIGH (ref 0–200)
HDL: 35.6 mg/dL — ABNORMAL LOW (ref >39.00)
Total CHOL/HDL Ratio: 7
VLDL: 32.4 mg/dL (ref 0.0–40.0)

## 2012-06-09 LAB — CBC WITH DIFFERENTIAL/PLATELET
Basophils Absolute: 0 10*3/uL (ref 0.0–0.1)
Eosinophils Absolute: 0.1 10*3/uL (ref 0.0–0.7)
Lymphocytes Relative: 20.2 % (ref 12.0–46.0)
MCHC: 34 g/dL (ref 30.0–36.0)
MCV: 85.4 fl (ref 78.0–100.0)
Monocytes Absolute: 0.5 10*3/uL (ref 0.1–1.0)
Neutrophils Relative %: 71.2 % (ref 43.0–77.0)
Platelets: 262 10*3/uL (ref 150.0–400.0)
RDW: 13.8 % (ref 11.5–14.6)

## 2012-06-09 LAB — BASIC METABOLIC PANEL
BUN: 22 mg/dL (ref 6–23)
Chloride: 101 mEq/L (ref 96–112)
Creatinine, Ser: 0.9 mg/dL (ref 0.4–1.2)
GFR: 69.13 mL/min (ref >60.00)
Potassium: 4 mEq/L (ref 3.5–5.1)

## 2012-06-09 LAB — HEPATIC FUNCTION PANEL
ALT: 13 U/L (ref 0–35)
AST: 13 U/L (ref 0–37)
Bilirubin, Direct: 0.1 mg/dL (ref 0.0–0.3)
Total Bilirubin: 0.7 mg/dL (ref 0.3–1.2)

## 2012-06-09 LAB — TSH: TSH: 0.79 u[IU]/mL (ref 0.35–5.50)

## 2012-06-09 NOTE — Patient Instructions (Addendum)
We'll notify you of your lab results and come up w/ a medication plan to start after surgery You look great!  Keep up the good work! We'll call you with your eye exam Call with any questions or concerns GOOD LUCK!!!

## 2012-06-09 NOTE — Progress Notes (Signed)
  Subjective:    Patient ID: Tracy Williams, female    DOB: 1945/11/12, 67 y.o.   MRN: [id number]  HPI Here today for CPE.  Risk Factors: DM- chronic problem, stopped metformin due to med side effects.  Overdue on eye exam.  Will occasionally have 'floaters'.  No CP, SOB, HAs, N/V/D, denies symptomatic lows. Hyperlipidemia- chronic problem, stopped taking Lipitor b/c she 'always forgot to take it'. Osteoporosis- dx'd on DEXA in 2013, was prescribed Fosamax but never started this. Physical Activity: no current exercise, has upcoming knee surgery Fall Risk: low Depression: chronic problem but pt feels sxs are well controlled Hearing: normal to conversational tones and whispered voice at 6 ft ADL's: independent Cognitive: normal linear thought process, memory and attention intact Home Safety: safe at home Height, Weight, BMI, Visual Acuity: see vitals, vision corrected to 20/20 w/ glasses Counseling: UTD on mammo, DEXA, UTD on colonoscopy (Dr Ewing Schlein) Labs Ordered: See A&P Care Plan: See A&P    Review of Systems Patient reports no hearing changes, adenopathy, fever, weight change,  persistant/recurrent hoarseness, swallowing issues, chest pain, palpitations, edema, persistant/recurrent cough, hemoptysis, dyspnea (rest/exertional/paroxysmal nocturnal), gastrointestinal bleeding (melena, rectal bleeding), abdominal pain, significant heartburn, bowel changes, GU symptoms (dysuria, hematuria, incontinence), Gyn symptoms (abnormal  bleeding, pain),  syncope, focal weakness, memory loss, numbness & tingling, skin/hair/nail changes, abnormal bruising or bleeding, anxiety, or depression.     Objective:   Physical Exam General Appearance:    Alert, cooperative, no distress, appears stated age  Head:    Normocephalic, without obvious abnormality, atraumatic  Eyes:    PERRL, conjunctiva/corneas clear, EOM's intact, fundi    benign, both eyes  Ears:    Normal TM's and external ear canals, both ears   Nose:   Nares normal, septum midline, mucosa normal, no drainage    or sinus tenderness  Throat:   Lips, mucosa, and tongue normal; teeth and gums normal  Neck:   Supple, symmetrical, trachea midline, no adenopathy;    Thyroid: no enlargement/tenderness/nodules  Back:     Symmetric, no curvature, ROM normal, no CVA tenderness  Lungs:     Clear to auscultation bilaterally, respirations unlabored  Chest Wall:    No tenderness or deformity   Heart:    Regular rate and rhythm, S1 and S2 normal, no murmur, rub   or gallop  Breast Exam:    Deferred to mammo  Abdomen:     Soft, non-tender, bowel sounds active all four quadrants,    no masses, no organomegaly  Genitalia:    Deferred   Rectal:    Extremities:   Extremities normal, atraumatic, no cyanosis or edema  Pulses:   2+ and symmetric all extremities  Skin:   Skin color, texture, turgor normal, no rashes or lesions  Lymph nodes:   Cervical, supraclavicular, and axillary nodes normal  Neurologic:   CNII-XII intact, normal strength, sensation and reflexes    throughout          Assessment & Plan:

## 2012-06-12 ENCOUNTER — Encounter (HOSPITAL_COMMUNITY): Payer: Self-pay | Admitting: Respiratory Therapy

## 2012-06-13 ENCOUNTER — Telehealth: Payer: Self-pay | Admitting: *Deleted

## 2012-06-13 LAB — VITAMIN D 1,25 DIHYDROXY: Vitamin D2 1, 25 (OH)2: <8 pg/mL

## 2012-06-13 MED ORDER — ATORVASTATIN CALCIUM 20 MG PO TABS: 20.0000 mg | ORAL_TABLET | Freq: Every day | ORAL | Status: DC

## 2012-06-13 NOTE — Telephone Encounter (Signed)
i'm assuming she means she had side effects or didn't tolerate this medication.  We can give her samples of Januvia to take daily to see if this works for her.  Can provide 3 months of samples if available (if not, what we have and will give more as we get it)

## 2012-06-13 NOTE — Telephone Encounter (Signed)
Message copied by Verdie Shire on Tue Jun 13, 2012  4:31 PM ------      Message from: Sheliah Hatch      Created: Sat Jun 10, 2012  5:26 PM       A1C is 7- needs to restart Metformin 500mg  BID after her surgery      Total cholesterol and LDL are also higher than they should be.  Based on this, needs to start Lipitor 20mg  nightly      Remainder of labs look good ------

## 2012-06-13 NOTE — Telephone Encounter (Signed)
Pt stated that the Metformin did not work for her.  Please advise.//AB/CMA    Rx for the Lipitor 20mg  was sent to the pharmacy by e-script.//AB/CMA

## 2012-06-14 ENCOUNTER — Encounter: Payer: Self-pay | Admitting: *Deleted

## 2012-06-14 ENCOUNTER — Encounter: Payer: Self-pay | Admitting: Physician Assistant

## 2012-06-14 ENCOUNTER — Other Ambulatory Visit: Payer: Self-pay | Admitting: Physician Assistant

## 2012-06-14 DIAGNOSIS — M1710 Unilateral primary osteoarthritis, unspecified knee: Secondary | ICD-10-CM | POA: Diagnosis not present

## 2012-06-14 NOTE — Telephone Encounter (Signed)
Spoke with the pt and she stated that she was not able to tolerate the Metformin.  Informed her that Dr. Beverely Low would like for her to try Januvia which we can give her samples of to see if this works for her.  Pt stated that she is getting ready to have surgery and she would like to wait until she get that behind her before starting a new med.  She said she mentioned this to Dr. Beverely Low and she said it was okay.  Informed the pt that when she's ready to get started on the med to please give me a call and I will get the samples ready for her.  Pt agreed.//AB/CMA

## 2012-06-14 NOTE — H&P (Signed)
TOTAL KNEE ADMISSION H&P  Patient is being admitted for right total knee arthroplasty.  Subjective:  Chief Complaint:right knee pain.  HPI: Tracy Williams, 66 y.o. female, has a history of pain and functional disability in the right knee due to arthritis and has failed non-surgical conservative treatments for greater than 12 weeks to includeNSAID's and/or analgesics, corticosteriod injections, flexibility and strengthening excercises, supervised PT with diminished ADL's post treatment, use of assistive devices, weight reduction as appropriate and activity modification.  Onset of symptoms was gradual, starting 10 years ago with gradually worsening course since that time. The patient noted no past surgery on the right knee(s).  Patient currently rates pain in the right knee(s) at 10 out of 10 with activity. Patient has night pain, worsening of pain with activity and weight bearing, pain that interferes with activities of daily living, crepitus and joint swelling.  Patient has evidence of subchondral sclerosis, periarticular osteophytes and joint space narrowing by imaging studies.  There is no active infection.  Patient Active Problem List   Diagnosis Date Noted  . 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 Type II or unspecified type diabetes mellitus without mention of complication, uncontrolled 06/09/2012  . Bronchitis 03/19/2011  . General medical examination 11/26/2010  . Elevated BP 11/26/2010  . Depression 11/26/2010  . Colon cancer screening 11/26/2010  . Other and unspecified hyperlipidemia 11/26/2010   Past Medical History  Diagnosis Date  . Arthritis   . GERD (gastroesophageal reflux disease)   . Allergy   . Hypertension   . Right knee DJD     Past Surgical History  Procedure Laterality Date  . Total knee arthroplasty  2010  . Cesarean section  1975     (Not in a hospital admission) No Known Allergies  History  Substance Use Topics  . Smoking status: Never Smoker    . Smokeless tobacco: Not on file  . Alcohol Use: No    Family History  Problem Relation Age of Onset  . Diabetes Mellitus II Father   . Hypertension Father   . Cancer - Other Mother     Breast     Review of Systems  Constitutional: Negative.   HENT: Negative.   Eyes: Negative.   Respiratory: Negative.   Cardiovascular: Negative.   Gastrointestinal: Negative.   Genitourinary: Negative.   Musculoskeletal: Positive for joint pain.  Skin: Negative.   Neurological: Negative.   Endo/Heme/Allergies: Negative.   Psychiatric/Behavioral: Negative.     Objective:  Physical Exam  Constitutional: She is oriented to person, place, and time. She appears well-developed and well-nourished.  HENT:  Head: Normocephalic and atraumatic.  Eyes: Conjunctivae and EOM are normal. Pupils are equal, round, and reactive to light.  Having floaters....seeing eye doctor on the 4th of April  Neck: Normal range of motion. Neck supple.  Cardiovascular: Normal rate and regular rhythm.   Respiratory: Effort normal and breath sounds normal.  GI: Soft. Bowel sounds are normal.  Genitourinary:  Not pertinent to current symptomatology therefore not examined.  Musculoskeletal:  Examination of her right knee reveals 1+ synovitis 1+ crepitation, pain medially mild varus deformity range of motion from -5 to 125 degrees knee is stable with normal patella tracking. Exam of her left knee reveals well healed total knee incision minimal swelling minimal pain range of motion 0-110 degrees knee is stable with normal patella tracking. Vascular exam: pulses 2+ and symmetric.   Neurological: She is alert and oriented to person, place, and time.  Skin: Skin is warm and   dry.  Psychiatric: She has a normal mood and affect. Her behavior is normal.    Vital signs in last 24 hours: Last recorded: 03/26 1500   BP: 166/74 Pulse: 80  Temp: 98.4 F (36.9 C)    Height: 5' 5" (1.651 m) SpO2: 96  Weight: 80.74 kg (178 lb)      Labs:   Estimated body mass index is 29.62 kg/(m^2) as calculated from the following:   Height as of this encounter: 5' 5" (1.651 m).   Weight as of this encounter: 80.74 kg (178 lb).   Imaging Review Plain radiographs demonstrate severe degenerative joint disease of the right knee(s). The overall alignment ismild valgus. The bone quality appears to be good for age and reported activity level.  Assessment/Plan:  End stage arthritis, right knee   The patient history, physical examination, clinical judgment of the provider and imaging studies are consistent with end stage degenerative joint disease of the right knee(s) and total knee arthroplasty is deemed medically necessary. The treatment options including medical management, injection therapy arthroscopy and arthroplasty were discussed at length. The risks and benefits of total knee arthroplasty were presented and reviewed. The risks due to aseptic loosening, infection, stiffness, patella tracking problems, thromboembolic complications and other imponderables were discussed. The patient acknowledged the explanation, agreed to proceed with the plan and consent was signed. Patient is being admitted for inpatient treatment for surgery, pain control, PT, OT, prophylactic antibiotics, VTE prophylaxis, progressive ambulation and ADL's and discharge planning. The patient is planning to be discharged home with home health services  Tracy Huisman A. Zakara Parkey, PA-C Physician Assistant [facility] [phone]  06/14/2012, 3:30 PM   

## 2012-06-15 ENCOUNTER — Telehealth: Payer: Self-pay | Admitting: *Deleted

## 2012-06-15 NOTE — Telephone Encounter (Signed)
Pt came in today and she requested to pick-up samples of the Januvia 100mg  1 daily that Dr. Beverely Low requested she start on.  Pt was given 2 mos of samples.//AB/CMA

## 2012-06-18 NOTE — Assessment & Plan Note (Signed)
Chronic problem.  Pt has not followed up as directed nor taken appropriate meds to control disease.  Overdue on eye exam.  Encouraged pt to schedule.  Pt denies symptomatic lows.  Check labs- start meds prn.

## 2012-06-18 NOTE — Assessment & Plan Note (Signed)
New.  Encouraged her to start Fosamax.  Check Vit D level

## 2012-06-18 NOTE — Assessment & Plan Note (Signed)
Chronic problem.  Stopped taking Lipitor b/c 'i forgot to take it'.  Stressed importance of lipid control- particularly in setting of diabetes.  Check labs.  Restart appropriate meds based on level.  Pt expressed understanding and is in agreement w/ plan.

## 2012-06-18 NOTE — Assessment & Plan Note (Signed)
BP again elevated but not high enough to start meds.  Will consider adding low dose ACE/ARB after surgery for added renal protection in the setting of DM

## 2012-06-18 NOTE — Assessment & Plan Note (Signed)
Pt's PE WNL.  UTD on health maintenance.  Check labs.  Anticipatory guidance provided.  

## 2012-06-22 ENCOUNTER — Encounter (HOSPITAL_COMMUNITY)
Admission: RE | Admit: 2012-06-22 | Discharge: 2012-06-22 | Disposition: A | Discharge status: Home / Self Care | Payer: Medicare Other | Source: Ambulatory Visit | Attending: Orthopedic Surgery | Admitting: Orthopedic Surgery

## 2012-06-22 ENCOUNTER — Ambulatory Visit (HOSPITAL_COMMUNITY)
Admission: RE | Admit: 2012-06-22 | Discharge: 2012-06-22 | Disposition: A | Discharge status: Home / Self Care | Payer: Medicare Other | Source: Ambulatory Visit | Attending: Physician Assistant | Admitting: Physician Assistant

## 2012-06-22 ENCOUNTER — Encounter (HOSPITAL_COMMUNITY): Payer: Self-pay

## 2012-06-22 ENCOUNTER — Encounter (HOSPITAL_COMMUNITY): Payer: Self-pay | Admitting: Pharmacy Technician

## 2012-06-22 DIAGNOSIS — F329 Major depressive disorder, single episode, unspecified: Secondary | ICD-10-CM | POA: Diagnosis not present

## 2012-06-22 DIAGNOSIS — M810 Age-related osteoporosis without current pathological fracture: Secondary | ICD-10-CM | POA: Diagnosis not present

## 2012-06-22 DIAGNOSIS — Z01810 Encounter for preprocedural cardiovascular examination: Secondary | ICD-10-CM | POA: Diagnosis not present

## 2012-06-22 DIAGNOSIS — E119 Type 2 diabetes mellitus without complications: Secondary | ICD-10-CM | POA: Diagnosis not present

## 2012-06-22 DIAGNOSIS — IMO0002 Reserved for concepts with insufficient information to code with codable children: Secondary | ICD-10-CM | POA: Diagnosis not present

## 2012-06-22 DIAGNOSIS — M1710 Unilateral primary osteoarthritis, unspecified knee: Secondary | ICD-10-CM | POA: Diagnosis not present

## 2012-06-22 DIAGNOSIS — Z01818 Encounter for other preprocedural examination: Secondary | ICD-10-CM | POA: Diagnosis not present

## 2012-06-22 DIAGNOSIS — Z01812 Encounter for preprocedural laboratory examination: Secondary | ICD-10-CM | POA: Diagnosis not present

## 2012-06-22 DIAGNOSIS — I10 Essential (primary) hypertension: Secondary | ICD-10-CM | POA: Diagnosis present

## 2012-06-22 DIAGNOSIS — K219 Gastro-esophageal reflux disease without esophagitis: Secondary | ICD-10-CM | POA: Diagnosis present

## 2012-06-22 DIAGNOSIS — E785 Hyperlipidemia, unspecified: Secondary | ICD-10-CM | POA: Diagnosis not present

## 2012-06-22 DIAGNOSIS — H02839 Dermatochalasis of unspecified eye, unspecified eyelid: Secondary | ICD-10-CM | POA: Diagnosis not present

## 2012-06-22 DIAGNOSIS — F3289 Other specified depressive episodes: Secondary | ICD-10-CM | POA: Diagnosis not present

## 2012-06-22 DIAGNOSIS — G8918 Other acute postprocedural pain: Secondary | ICD-10-CM | POA: Diagnosis not present

## 2012-06-22 DIAGNOSIS — H2510 Age-related nuclear cataract, unspecified eye: Secondary | ICD-10-CM | POA: Diagnosis not present

## 2012-06-22 DIAGNOSIS — H43819 Vitreous degeneration, unspecified eye: Secondary | ICD-10-CM | POA: Diagnosis not present

## 2012-06-22 HISTORY — DX: Cardiac arrhythmia, unspecified: I49.9

## 2012-06-22 HISTORY — DX: Type 2 diabetes mellitus without complications: E11.9

## 2012-06-22 HISTORY — DX: Nausea with vomiting, unspecified: R11.2

## 2012-06-22 HISTORY — DX: Nausea with vomiting, unspecified: Z98.890

## 2012-06-22 LAB — CBC WITH DIFFERENTIAL/PLATELET
Basophils Absolute: 0 10*3/uL (ref 0.0–0.1)
HCT: 38.9 % (ref 36.0–46.0)
Lymphocytes Relative: 22 % (ref 12–46)
Lymphs Abs: 1.9 10*3/uL (ref 0.7–4.0)
Neutro Abs: 5.9 10*3/uL (ref 1.7–7.7)
Platelets: 253 10*3/uL (ref 150–400)
RBC: 4.6 MIL/uL (ref 3.87–5.11)
RDW: 13.3 % (ref 11.5–15.5)
WBC: 8.6 10*3/uL (ref 4.0–10.5)

## 2012-06-22 LAB — COMPREHENSIVE METABOLIC PANEL
ALT: 15 U/L (ref 0–35)
AST: 15 U/L (ref 0–37)
Alkaline Phosphatase: 95 U/L (ref 39–117)
CO2: 28 mEq/L (ref 19–32)
Chloride: 100 mEq/L (ref 96–112)
GFR calc Af Amer: >90 mL/min (ref >90)
GFR calc non Af Amer: 86 mL/min — ABNORMAL LOW (ref >90)
Glucose, Bld: 122 mg/dL — ABNORMAL HIGH (ref 70–99)
Sodium: 137 mEq/L (ref 135–145)
Total Bilirubin: 0.5 mg/dL (ref 0.3–1.2)

## 2012-06-22 LAB — SURGICAL PCR SCREEN
MRSA, PCR: NEGATIVE
Staphylococcus aureus: NEGATIVE

## 2012-06-22 LAB — APTT: aPTT: 29 seconds (ref 24–37)

## 2012-06-22 LAB — URINALYSIS, ROUTINE W REFLEX MICROSCOPIC
Glucose, UA: NEGATIVE mg/dL
Leukocytes, UA: NEGATIVE
Protein, ur: NEGATIVE mg/dL
Specific Gravity, Urine: 1.021 (ref 1.005–1.030)
pH: 7.5 (ref 5.0–8.0)

## 2012-06-22 LAB — ABO/RH: ABO/RH(D): A NEG

## 2012-06-22 LAB — TYPE AND SCREEN: ABO/RH(D): A NEG

## 2012-06-22 NOTE — Progress Notes (Signed)
Pt denies SOB and chest pain. Pt was seen by Dr. Katrinka Blazing at [facility] in 2010. Records were requested for echo.

## 2012-06-22 NOTE — Pre-Procedure Instructions (Signed)
Tracy Williams  06/22/2012   Your procedure is scheduled on: Monday, June 26, 2012  Report to [facility] at 5:30 AM.  Call this number if you have problems the morning of surgery: [phone]   Remember:   Do not eat food or drink liquids after midnight.   Take these medicines the morning of surgery with A SIP OF WATER: None scheduled   Do not wear jewelry, make-up or nail polish.  Do not wear lotions, powders, or perfumes. You may wear deodorant.  Do not shave 48 hours prior to surgery.  Do not bring valuables to the hospital.  Contacts, dentures or bridgework may not be worn into surgery.  Leave suitcase in the car. After surgery it may be brought to your room.  For patients admitted to the hospital, checkout time is 11:00 AM the day of  discharge.   Patients discharged the day of surgery will not be allowed to drive home.  Name and phone number of your driver:   Special Instructions: Shower using CHG 2 nights before surgery and the night before surgery.  If you shower the day of surgery use CHG.  Use special wash - you have one bottle of CHG for all showers.  You should use approximately 1/3 of the bottle for each shower.   Please read over the following fact sheets that you were given: Pain Booklet, Coughing and Deep Breathing and Surgical Site Infection Prevention

## 2012-06-23 DIAGNOSIS — H2510 Age-related nuclear cataract, unspecified eye: Secondary | ICD-10-CM | POA: Diagnosis not present

## 2012-06-23 DIAGNOSIS — H02839 Dermatochalasis of unspecified eye, unspecified eyelid: Secondary | ICD-10-CM | POA: Diagnosis not present

## 2012-06-23 DIAGNOSIS — E119 Type 2 diabetes mellitus without complications: Secondary | ICD-10-CM | POA: Diagnosis not present

## 2012-06-23 DIAGNOSIS — H43819 Vitreous degeneration, unspecified eye: Secondary | ICD-10-CM | POA: Diagnosis not present

## 2012-06-23 LAB — URINE CULTURE

## 2012-06-23 NOTE — Progress Notes (Signed)
Anesthesia chart review: Patient is a 67 year old female scheduled for right TKR by Dr. Thurston Hole on 06/26/12.  History includes DM2, nonsmoker, hypertension, GERD, postoperative nausea and vomiting, osteoarthritis, palpitations '10, left TKR '10.  PCP is Dr. Beverely Low.  EKG on 06/22/12 showed NSR, cannot rule out anterior infarct (age undetermined).  Overall, I think it is stable since 11/26/10.  She was evaluated by cardiologist Dr. Verdis Prime in 2010 for palpitations and had a relatively benign echo (see below).  Echo on 09/26/08 showed LVEF 60-65%. Aortic valve is sclerotic but opens well. Trace MR, trivial TR, trace PR.  Chest x-ray on 06/22/2012 showed no radiographic evidence of acute cardiopulmonary disease.  Preoperative labs noted. Shepperson, PA-C is aware of urine culture results and plans to have a UA resent when her foley is placed in the OR.  She will be evaluated by her assigned anesthesiologist on the day of surgery, but would anticipate she could proceed as planned.  Velna Ochs [facility]/Anesthesiology Phone [phone] 06/23/2012 4:48 PM

## 2012-06-25 MED ORDER — TRANEXAMIC ACID 100 MG/ML IV SOLN
1000.0000 mg | INTRAVENOUS | Status: AC
  Administered 2012-06-26: 1000 mg via INTRAVENOUS
  Filled 2012-06-25: qty 10

## 2012-06-25 MED ORDER — CEFAZOLIN SODIUM-DEXTROSE 2-3 GM-% IV SOLR
2.0000 g | INTRAVENOUS | Status: AC
  Administered 2012-06-26: 2 g via INTRAVENOUS

## 2012-06-26 ENCOUNTER — Encounter (HOSPITAL_COMMUNITY): Payer: Self-pay | Admitting: Anesthesiology

## 2012-06-26 ENCOUNTER — Inpatient Hospital Stay (HOSPITAL_COMMUNITY)
Admission: RE | Admit: 2012-06-26 | Discharge: 2012-06-27 | DRG: 470 | Disposition: A | Discharge status: Home Health | Payer: Medicare Other | Source: Ambulatory Visit | Attending: Orthopedic Surgery | Admitting: Orthopedic Surgery

## 2012-06-26 ENCOUNTER — Encounter (HOSPITAL_COMMUNITY)
Admission: RE | Disposition: A | Discharge status: Home Health | Payer: Self-pay | Source: Ambulatory Visit | Attending: Orthopedic Surgery

## 2012-06-26 ENCOUNTER — Encounter (HOSPITAL_COMMUNITY): Payer: Self-pay | Admitting: Vascular Surgery

## 2012-06-26 ENCOUNTER — Encounter (HOSPITAL_COMMUNITY): Payer: Self-pay | Admitting: *Deleted

## 2012-06-26 DIAGNOSIS — IMO0002 Reserved for concepts with insufficient information to code with codable children: Principal | ICD-10-CM | POA: Diagnosis present

## 2012-06-26 DIAGNOSIS — IMO0001 Reserved for inherently not codable concepts without codable children: Secondary | ICD-10-CM

## 2012-06-26 DIAGNOSIS — M810 Age-related osteoporosis without current pathological fracture: Secondary | ICD-10-CM | POA: Diagnosis not present

## 2012-06-26 DIAGNOSIS — F329 Major depressive disorder, single episode, unspecified: Secondary | ICD-10-CM | POA: Diagnosis present

## 2012-06-26 DIAGNOSIS — E118 Type 2 diabetes mellitus with unspecified complications: Secondary | ICD-10-CM | POA: Diagnosis present

## 2012-06-26 DIAGNOSIS — Z01810 Encounter for preprocedural cardiovascular examination: Secondary | ICD-10-CM

## 2012-06-26 DIAGNOSIS — G8918 Other acute postprocedural pain: Secondary | ICD-10-CM | POA: Diagnosis not present

## 2012-06-26 DIAGNOSIS — E119 Type 2 diabetes mellitus without complications: Secondary | ICD-10-CM | POA: Diagnosis present

## 2012-06-26 DIAGNOSIS — F32A Depression, unspecified: Secondary | ICD-10-CM | POA: Diagnosis present

## 2012-06-26 DIAGNOSIS — I10 Essential (primary) hypertension: Secondary | ICD-10-CM | POA: Diagnosis present

## 2012-06-26 DIAGNOSIS — E785 Hyperlipidemia, unspecified: Secondary | ICD-10-CM | POA: Diagnosis present

## 2012-06-26 DIAGNOSIS — Z01818 Encounter for other preprocedural examination: Secondary | ICD-10-CM

## 2012-06-26 DIAGNOSIS — K219 Gastro-esophageal reflux disease without esophagitis: Secondary | ICD-10-CM | POA: Diagnosis present

## 2012-06-26 DIAGNOSIS — Z01812 Encounter for preprocedural laboratory examination: Secondary | ICD-10-CM

## 2012-06-26 DIAGNOSIS — M1710 Unilateral primary osteoarthritis, unspecified knee: Secondary | ICD-10-CM | POA: Diagnosis not present

## 2012-06-26 DIAGNOSIS — M1711 Unilateral primary osteoarthritis, right knee: Secondary | ICD-10-CM

## 2012-06-26 DIAGNOSIS — F3289 Other specified depressive episodes: Secondary | ICD-10-CM | POA: Diagnosis present

## 2012-06-26 HISTORY — PX: TOTAL KNEE ARTHROPLASTY: SHX125

## 2012-06-26 LAB — CBC
Hemoglobin: 11.1 g/dL — ABNORMAL LOW (ref 12.0–15.0)
MCH: 29 pg (ref 26.0–34.0)
MCHC: 34.9 g/dL (ref 30.0–36.0)
MCV: 83 fL (ref 78.0–100.0)
RBC: 3.83 MIL/uL — ABNORMAL LOW (ref 3.87–5.11)

## 2012-06-26 LAB — URINALYSIS, ROUTINE W REFLEX MICROSCOPIC
Ketones, ur: NEGATIVE mg/dL
Leukocytes, UA: NEGATIVE
Nitrite: NEGATIVE
Urobilinogen, UA: 1 mg/dL (ref 0.0–1.0)
pH: 6 (ref 5.0–8.0)

## 2012-06-26 LAB — GLUCOSE, CAPILLARY: Glucose-Capillary: 188 mg/dL — ABNORMAL HIGH (ref 70–99)

## 2012-06-26 LAB — CREATININE, SERUM: Creatinine, Ser: 0.74 mg/dL (ref 0.50–1.10)

## 2012-06-26 SURGERY — ARTHROPLASTY, KNEE, TOTAL
Anesthesia: General | Site: Knee | Laterality: Right | Wound class: Clean

## 2012-06-26 MED ORDER — ACETAMINOPHEN 650 MG RE SUPP: 650.0000 mg | Freq: Four times a day (QID) | RECTAL | Status: DC | PRN

## 2012-06-26 MED ORDER — TRAMADOL HCL 50 MG PO TABS
50.0000 mg | ORAL_TABLET | Freq: Four times a day (QID) | ORAL | Status: DC
  Administered 2012-06-26 – 2012-06-27 (×5): 100 mg via ORAL
  Filled 2012-06-26 (×5): qty 2

## 2012-06-26 MED ORDER — METOCLOPRAMIDE HCL 5 MG/ML IJ SOLN
5.0000 mg | Freq: Three times a day (TID) | INTRAMUSCULAR | Status: DC | PRN
Start: 2012-06-26 — End: 2012-06-27

## 2012-06-26 MED ORDER — LACTATED RINGERS IV SOLN: INTRAVENOUS | Status: DC

## 2012-06-26 MED ORDER — CEFAZOLIN SODIUM-DEXTROSE 2-3 GM-% IV SOLR
INTRAVENOUS | Status: AC
  Filled 2012-06-26: qty 50

## 2012-06-26 MED ORDER — GLYCOPYRROLATE 0.2 MG/ML IJ SOLN
INTRAMUSCULAR | Status: DC | PRN
  Administered 2012-06-26: 0.4 mg via INTRAVENOUS

## 2012-06-26 MED ORDER — CEFUROXIME SODIUM 1.5 G IJ SOLR
INTRAMUSCULAR | Status: AC
  Filled 2012-06-26: qty 1.5

## 2012-06-26 MED ORDER — MIDAZOLAM HCL 5 MG/5ML IJ SOLN
INTRAMUSCULAR | Status: DC | PRN
  Administered 2012-06-26: 2 mg via INTRAVENOUS

## 2012-06-26 MED ORDER — DIPHENHYDRAMINE HCL 12.5 MG/5ML PO ELIX
12.5000 mg | ORAL_SOLUTION | ORAL | Status: DC | PRN
Start: 2012-06-26 — End: 2012-06-27

## 2012-06-26 MED ORDER — CHLORHEXIDINE GLUCONATE 4 % EX LIQD: 60.0000 mL | Freq: Once | CUTANEOUS | Status: DC

## 2012-06-26 MED ORDER — OXYCODONE HCL 5 MG PO TABS
ORAL_TABLET | ORAL | Status: AC
  Filled 2012-06-26: qty 1

## 2012-06-26 MED ORDER — MENTHOL 3 MG MT LOZG: 1.0000 | LOZENGE | OROMUCOSAL | Status: DC | PRN

## 2012-06-26 MED ORDER — ENOXAPARIN SODIUM 30 MG/0.3ML ~~LOC~~ SOLN
30.0000 mg | Freq: Two times a day (BID) | SUBCUTANEOUS | Status: DC
  Administered 2012-06-27: 30 mg via SUBCUTANEOUS
  Filled 2012-06-26 (×3): qty 0.3

## 2012-06-26 MED ORDER — PHENOL 1.4 % MT LIQD: 1.0000 | OROMUCOSAL | Status: DC | PRN

## 2012-06-26 MED ORDER — HYDROMORPHONE HCL PF 1 MG/ML IJ SOLN
INTRAMUSCULAR | Status: AC
  Filled 2012-06-26: qty 1

## 2012-06-26 MED ORDER — POTASSIUM CHLORIDE IN NACL 20-0.9 MEQ/L-% IV SOLN
INTRAVENOUS | Status: DC
  Administered 2012-06-26: 22:00:00 via INTRAVENOUS
  Filled 2012-06-26 (×4): qty 1000

## 2012-06-26 MED ORDER — ROCURONIUM BROMIDE 100 MG/10ML IV SOLN
INTRAVENOUS | Status: DC | PRN
  Administered 2012-06-26: 40 mg via INTRAVENOUS

## 2012-06-26 MED ORDER — CELECOXIB 200 MG PO CAPS
200.0000 mg | ORAL_CAPSULE | Freq: Two times a day (BID) | ORAL | Status: DC
  Administered 2012-06-26 – 2012-06-27 (×3): 200 mg via ORAL
  Filled 2012-06-26 (×4): qty 1

## 2012-06-26 MED ORDER — ONDANSETRON HCL 4 MG PO TABS: 4.0000 mg | ORAL_TABLET | Freq: Four times a day (QID) | ORAL | Status: DC | PRN

## 2012-06-26 MED ORDER — ACETAMINOPHEN 10 MG/ML IV SOLN
INTRAVENOUS | Status: DC | PRN
  Administered 2012-06-26: 1000 mg via INTRAVENOUS

## 2012-06-26 MED ORDER — PNEUMOCOCCAL VAC POLYVALENT 25 MCG/0.5ML IJ INJ
0.5000 mL | INJECTION | INTRAMUSCULAR | Status: AC
  Administered 2012-06-27: 0.5 mL via INTRAMUSCULAR
  Filled 2012-06-26: qty 0.5

## 2012-06-26 MED ORDER — BUPIVACAINE-EPINEPHRINE PF 0.5-1:200000 % IJ SOLN
INTRAMUSCULAR | Status: DC | PRN
  Administered 2012-06-26: 25 mL

## 2012-06-26 MED ORDER — DEXAMETHASONE SODIUM PHOSPHATE 4 MG/ML IJ SOLN
INTRAMUSCULAR | Status: DC | PRN
  Administered 2012-06-26: 4 mg via INTRAVENOUS

## 2012-06-26 MED ORDER — SCOPOLAMINE 1 MG/3DAYS TD PT72
1.0000 | MEDICATED_PATCH | TRANSDERMAL | Status: AC
  Filled 2012-06-26: qty 1

## 2012-06-26 MED ORDER — HYDROMORPHONE HCL PF 1 MG/ML IJ SOLN: 0.5000 mg | INTRAMUSCULAR | Status: DC | PRN

## 2012-06-26 MED ORDER — METOCLOPRAMIDE HCL 5 MG/ML IJ SOLN
INTRAMUSCULAR | Status: DC | PRN
  Administered 2012-06-26: 20 mg via INTRAVENOUS

## 2012-06-26 MED ORDER — OXYCODONE HCL 5 MG/5ML PO SOLN: 5.0000 mg | Freq: Once | ORAL | Status: AC | PRN

## 2012-06-26 MED ORDER — BUPIVACAINE-EPINEPHRINE 0.25% -1:200000 IJ SOLN
INTRAMUSCULAR | Status: AC
  Filled 2012-06-26: qty 1

## 2012-06-26 MED ORDER — ONDANSETRON HCL 4 MG/2ML IJ SOLN
INTRAMUSCULAR | Status: DC | PRN
  Administered 2012-06-26: 4 mg via INTRAVENOUS

## 2012-06-26 MED ORDER — HYDROMORPHONE HCL PF 1 MG/ML IJ SOLN
INTRAMUSCULAR | Status: AC
  Filled 2012-06-26: qty 2

## 2012-06-26 MED ORDER — PROPOFOL 10 MG/ML IV BOLUS
INTRAVENOUS | Status: DC | PRN
  Administered 2012-06-26: 200 mg via INTRAVENOUS

## 2012-06-26 MED ORDER — METOCLOPRAMIDE HCL 10 MG PO TABS: 5.0000 mg | ORAL_TABLET | Freq: Three times a day (TID) | ORAL | Status: DC | PRN

## 2012-06-26 MED ORDER — LIDOCAINE HCL (CARDIAC) 20 MG/ML IV SOLN
INTRAVENOUS | Status: DC | PRN
  Administered 2012-06-26: 40 mg via INTRAVENOUS

## 2012-06-26 MED ORDER — ATORVASTATIN CALCIUM 20 MG PO TABS
20.0000 mg | ORAL_TABLET | Freq: Every day | ORAL | Status: DC
  Administered 2012-06-26: 20 mg via ORAL
  Filled 2012-06-26 (×2): qty 1

## 2012-06-26 MED ORDER — SCOPOLAMINE 1 MG/3DAYS TD PT72
MEDICATED_PATCH | TRANSDERMAL | Status: DC | PRN
  Administered 2012-06-26: 1 via TRANSDERMAL

## 2012-06-26 MED ORDER — DOCUSATE SODIUM 100 MG PO CAPS
100.0000 mg | ORAL_CAPSULE | Freq: Two times a day (BID) | ORAL | Status: DC
  Administered 2012-06-26 – 2012-06-27 (×3): 100 mg via ORAL
  Filled 2012-06-26 (×3): qty 1

## 2012-06-26 MED ORDER — HYDROMORPHONE HCL PF 1 MG/ML IJ SOLN
0.2500 mg | INTRAMUSCULAR | Status: DC | PRN
  Administered 2012-06-26 (×5): 0.5 mg via INTRAVENOUS

## 2012-06-26 MED ORDER — NEOSTIGMINE METHYLSULFATE 1 MG/ML IJ SOLN
INTRAMUSCULAR | Status: DC | PRN
  Administered 2012-06-26: 3 mg via INTRAVENOUS

## 2012-06-26 MED ORDER — ZOLPIDEM TARTRATE 5 MG PO TABS: 5.0000 mg | ORAL_TABLET | Freq: Every evening | ORAL | Status: DC | PRN

## 2012-06-26 MED ORDER — ACETAMINOPHEN 10 MG/ML IV SOLN
INTRAVENOUS | Status: AC
  Filled 2012-06-26: qty 100

## 2012-06-26 MED ORDER — BUPIVACAINE-EPINEPHRINE 0.25% -1:200000 IJ SOLN
INTRAMUSCULAR | Status: DC | PRN
  Administered 2012-06-26: 30 mL

## 2012-06-26 MED ORDER — ALUM & MAG HYDROXIDE-SIMETH 200-200-20 MG/5ML PO SUSP: 30.0000 mL | ORAL | Status: DC | PRN

## 2012-06-26 MED ORDER — METOCLOPRAMIDE HCL 5 MG/ML IJ SOLN
10.0000 mg | INTRAMUSCULAR | Status: DC
  Filled 2012-06-26: qty 2

## 2012-06-26 MED ORDER — ACETAMINOPHEN 325 MG PO TABS
650.0000 mg | ORAL_TABLET | Freq: Four times a day (QID) | ORAL | Status: DC | PRN
  Administered 2012-06-27: 650 mg via ORAL
  Filled 2012-06-26: qty 2

## 2012-06-26 MED ORDER — ONDANSETRON HCL 4 MG/2ML IJ SOLN: 4.0000 mg | Freq: Four times a day (QID) | INTRAMUSCULAR | Status: DC | PRN

## 2012-06-26 MED ORDER — LABETALOL HCL 5 MG/ML IV SOLN
INTRAVENOUS | Status: DC | PRN
  Administered 2012-06-26: 5 mg via INTRAVENOUS

## 2012-06-26 MED ORDER — CEFUROXIME SODIUM 1.5 G IJ SOLR
INTRAMUSCULAR | Status: DC | PRN
  Administered 2012-06-26: 1.5 g

## 2012-06-26 MED ORDER — ACETAMINOPHEN 10 MG/ML IV SOLN
1000.0000 mg | Freq: Four times a day (QID) | INTRAVENOUS | Status: AC
  Administered 2012-06-26 – 2012-06-27 (×4): 1000 mg via INTRAVENOUS
  Filled 2012-06-26 (×4): qty 100

## 2012-06-26 MED ORDER — SODIUM CHLORIDE 0.9 % IR SOLN
Status: DC | PRN
  Administered 2012-06-26: 3000 mL
  Administered 2012-06-26: 1000 mL

## 2012-06-26 MED ORDER — DEXAMETHASONE 6 MG PO TABS
10.0000 mg | ORAL_TABLET | Freq: Three times a day (TID) | ORAL | Status: AC
  Administered 2012-06-26 – 2012-06-27 (×2): 10 mg via ORAL
  Filled 2012-06-26 (×3): qty 1

## 2012-06-26 MED ORDER — LACTATED RINGERS IV SOLN
INTRAVENOUS | Status: DC | PRN
  Administered 2012-06-26 (×2): via INTRAVENOUS

## 2012-06-26 MED ORDER — METOCLOPRAMIDE HCL 5 MG/ML IJ SOLN: 10.0000 mg | Freq: Once | INTRAMUSCULAR | Status: DC | PRN

## 2012-06-26 MED ORDER — DEXAMETHASONE SODIUM PHOSPHATE 10 MG/ML IJ SOLN
10.0000 mg | Freq: Three times a day (TID) | INTRAMUSCULAR | Status: AC
  Administered 2012-06-26: 10 mg via INTRAVENOUS
  Filled 2012-06-26 (×3): qty 1

## 2012-06-26 MED ORDER — OXYCODONE HCL 5 MG PO TABS
5.0000 mg | ORAL_TABLET | Freq: Once | ORAL | Status: AC | PRN
  Administered 2012-06-26: 5 mg via ORAL

## 2012-06-26 MED ORDER — POVIDONE-IODINE 7.5 % EX SOLN: Freq: Once | CUTANEOUS | Status: DC

## 2012-06-26 MED ORDER — BUPIVACAINE 0.25 % ON-Q PUMP SINGLE CATH 300ML
300.0000 mL | INJECTION | Status: DC
Start: 2012-06-26 — End: 2012-06-26
  Filled 2012-06-26: qty 300

## 2012-06-26 MED ORDER — FENTANYL CITRATE 0.05 MG/ML IJ SOLN
INTRAMUSCULAR | Status: DC | PRN
  Administered 2012-06-26 (×7): 50 ug via INTRAVENOUS

## 2012-06-26 SURGICAL SUPPLY — 67 items
BANDAGE ESMARK 6X9 LF (GAUZE/BANDAGES/DRESSINGS) ×1 IMPLANT
BLADE SAGITTAL 25.0X1.19X90 (BLADE) ×2 IMPLANT
BLADE SAW SGTL 11.0X1.19X90.0M (BLADE) IMPLANT
BLADE SAW SGTL 13.0X1.19X90.0M (BLADE) ×2 IMPLANT
BLADE SURG 10 STRL SS (BLADE) ×4 IMPLANT
BNDG CMPR 9X6 STRL LF SNTH (GAUZE/BANDAGES/DRESSINGS) ×1
BNDG CMPR MED 15X6 ELC VLCR LF (GAUZE/BANDAGES/DRESSINGS) ×1
BNDG ELASTIC 6X15 VLCR STRL LF (GAUZE/BANDAGES/DRESSINGS) ×2 IMPLANT
BNDG ESMARK 6X9 LF (GAUZE/BANDAGES/DRESSINGS) ×2
BOWL SMART MIX CTS (DISPOSABLE) ×2 IMPLANT
CEMENT HV SMART SET (Cement) ×4 IMPLANT
CLOTH BEACON ORANGE TIMEOUT ST (SAFETY) ×2 IMPLANT
CLSR STERI-STRIP ANTIMIC 1/2X4 (GAUZE/BANDAGES/DRESSINGS) ×1 IMPLANT
COVER SURGICAL LIGHT HANDLE (MISCELLANEOUS) ×2 IMPLANT
CUFF TOURNIQUET SINGLE 34IN LL (TOURNIQUET CUFF) ×2 IMPLANT
CUFF TOURNIQUET SINGLE 44IN (TOURNIQUET CUFF) IMPLANT
DRAPE EXTREMITY T 121X128X90 (DRAPE) ×2 IMPLANT
DRAPE INCISE IOBAN 66X45 STRL (DRAPES) ×2 IMPLANT
DRAPE PROXIMA HALF (DRAPES) ×2 IMPLANT
DRAPE U-SHAPE 47X51 STRL (DRAPES) ×2 IMPLANT
DRSG ADAPTIC 3X8 NADH LF (GAUZE/BANDAGES/DRESSINGS) ×2 IMPLANT
DRSG PAD ABDOMINAL 8X10 ST (GAUZE/BANDAGES/DRESSINGS) ×4 IMPLANT
DURAPREP 26ML APPLICATOR (WOUND CARE) ×2 IMPLANT
ELECT CAUTERY BLADE 6.4 (BLADE) ×2 IMPLANT
ELECT REM PT RETURN 9FT ADLT (ELECTROSURGICAL) ×2
ELECTRODE REM PT RTRN 9FT ADLT (ELECTROSURGICAL) ×1 IMPLANT
EVACUATOR 1/8 PVC DRAIN (DRAIN) ×1 IMPLANT
FACESHIELD LNG OPTICON STERILE (SAFETY) ×2 IMPLANT
GLOVE BIO SURGEON STRL SZ7 (GLOVE) ×4 IMPLANT
GLOVE BIOGEL PI IND STRL 7.0 (GLOVE) ×1 IMPLANT
GLOVE BIOGEL PI IND STRL 7.5 (GLOVE) ×1 IMPLANT
GLOVE BIOGEL PI INDICATOR 7.0 (GLOVE) ×1
GLOVE BIOGEL PI INDICATOR 7.5 (GLOVE) ×2
GLOVE SS BIOGEL STRL SZ 7.5 (GLOVE) ×1 IMPLANT
GLOVE SUPERSENSE BIOGEL SZ 7.5 (GLOVE) ×2
GOWN PREVENTION PLUS XLARGE (GOWN DISPOSABLE) ×4 IMPLANT
GOWN STRL NON-REIN LRG LVL3 (GOWN DISPOSABLE) ×4 IMPLANT
HANDPIECE INTERPULSE COAX TIP (DISPOSABLE) ×2
HOOD PEEL AWAY FACE SHEILD DIS (HOOD) ×4 IMPLANT
IMMOBILIZER KNEE 22 UNIV (SOFTGOODS) ×1 IMPLANT
INSERT CUSHION PRONEVIEW LG (MISCELLANEOUS) ×2 IMPLANT
KIT BASIN OR (CUSTOM PROCEDURE TRAY) ×2 IMPLANT
KIT ROOM TURNOVER OR (KITS) ×2 IMPLANT
MANIFOLD NEPTUNE II (INSTRUMENTS) ×2 IMPLANT
NS IRRIG 1000ML POUR BTL (IV SOLUTION) ×2 IMPLANT
PACK TOTAL JOINT (CUSTOM PROCEDURE TRAY) ×2 IMPLANT
PAD ARMBOARD 7.5X6 YLW CONV (MISCELLANEOUS) ×2 IMPLANT
PAD CAST 4YDX4 CTTN HI CHSV (CAST SUPPLIES) ×1 IMPLANT
PADDING CAST COTTON 4X4 STRL (CAST SUPPLIES) ×2
PADDING CAST COTTON 6X4 STRL (CAST SUPPLIES) ×2 IMPLANT
POSITIONER HEAD PRONE TRACH (MISCELLANEOUS) ×2 IMPLANT
RUBBERBAND STERILE (MISCELLANEOUS) ×2 IMPLANT
SET HNDPC FAN SPRY TIP SCT (DISPOSABLE) ×1 IMPLANT
SPONGE GAUZE 4X4 12PLY (GAUZE/BANDAGES/DRESSINGS) ×2 IMPLANT
STRIP CLOSURE SKIN 1/2X4 (GAUZE/BANDAGES/DRESSINGS) ×1 IMPLANT
SUCTION FRAZIER TIP 10 FR DISP (SUCTIONS) ×2 IMPLANT
SUT ETHIBOND NAB CT1 #1 30IN (SUTURE) ×4 IMPLANT
SUT MNCRL AB 3-0 PS2 18 (SUTURE) ×2 IMPLANT
SUT VIC AB 0 CT1 27 (SUTURE) ×4
SUT VIC AB 0 CT1 27XBRD ANBCTR (SUTURE) ×2 IMPLANT
SUT VIC AB 2-0 CT1 27 (SUTURE) ×4
SUT VIC AB 2-0 CT1 TAPERPNT 27 (SUTURE) ×2 IMPLANT
SYR 30ML SLIP (SYRINGE) ×2 IMPLANT
TOWEL OR 17X24 6PK STRL BLUE (TOWEL DISPOSABLE) ×2 IMPLANT
TOWEL OR 17X26 10 PK STRL BLUE (TOWEL DISPOSABLE) ×2 IMPLANT
TRAY FOLEY CATH 14FR (SET/KITS/TRAYS/PACK) ×2 IMPLANT
WATER STERILE IRR 1000ML POUR (IV SOLUTION) ×4 IMPLANT

## 2012-06-26 NOTE — Progress Notes (Signed)
Spoke with Dr. Justin Mend,  Mrs Partridge has no relief from pain after 2 mg dilaudid.  She remains weepy and rates pain at 10/10.  Dr. Justin Mend said to continue with dilaudid as long as the respirations are adequate.

## 2012-06-26 NOTE — Transfer of Care (Signed)
Immediate Anesthesia Transfer of Care Note  Patient: Tracy Williams  Procedure(s) Performed: Procedure(s): TOTAL KNEE ARTHROPLASTY- right (Right)  Patient Location: PACU  Anesthesia Type:GA combined with regional for post-op pain  Level of Consciousness: awake, alert , oriented and patient cooperative  Airway & Oxygen Therapy: Patient Spontanous Breathing and Patient connected to nasal cannula oxygen  Post-op Assessment: Report given to PACU RN, Post -op Vital signs reviewed and stable and Patient moving all extremities X 4  Post vital signs: Reviewed and stable  Complications: No apparent anesthesia complications

## 2012-06-26 NOTE — Anesthesia Postprocedure Evaluation (Signed)
Anesthesia Post Note  Patient: Tracy Williams  Procedure(s) Performed: Procedure(s) (LRB): TOTAL KNEE ARTHROPLASTY- right (Right)  Anesthesia type: general  Patient location: PACU  Post pain: Pain level controlled  Post assessment: Patient's Cardiovascular Status Stable  Last Vitals:  Filed Vitals:   06/26/12 2012  BP: 141/71  Pulse: 88  Temp: 36.5 C  Resp: 16    Post vital signs: Reviewed and stable  Level of consciousness: sedated  Complications: No apparent anesthesia complications

## 2012-06-26 NOTE — Anesthesia Postprocedure Evaluation (Signed)
Anesthesia Post Note  Patient: Tracy Williams  Procedure(s) Performed: Procedure(s) (LRB): TOTAL KNEE ARTHROPLASTY- right (Right)  Anesthesia type: general  Patient location: PACU  Post pain: Pain level controlled  Post assessment: Patient's Cardiovascular Status Stable  Last Vitals:  Filed Vitals:   06/26/12 1015  BP: 175/64  Pulse: 51  Temp:   Resp: 11    Post vital signs: Reviewed and stable  Level of consciousness: sedated  Complications: No apparent anesthesia complications

## 2012-06-26 NOTE — Progress Notes (Signed)
Orthopedic Tech Progress Note Patient Details:  Tracy Williams 11/30/1945 [id number] CPM applied to Right LE with appropriate settings. OHF applied to bed.  CPM Right Knee CPM Right Knee: On Right Knee Flexion (Degrees): 60 Right Knee Extension (Degrees): 0   Asia R Thompson 06/26/2012, 10:21 AM

## 2012-06-26 NOTE — Interval H&P Note (Signed)
History and Physical Interval Note:  06/26/2012 7:03 AM  Tracy Williams  has presented today for surgery, with the diagnosis of DJD RIGHT KNEE  The various methods of treatment have been discussed with the patient and family. After consideration of risks, benefits and other options for treatment, the patient has consented to  Procedure(s): TOTAL KNEE ARTHROPLASTY (Right) as a surgical intervention .  The patient's history has been reviewed, patient examined, no change in status, stable for surgery.  I have reviewed the patient's chart and labs.  Questions were answered to the patient's satisfaction.     Salvatore Marvel A

## 2012-06-26 NOTE — Anesthesia Procedure Notes (Addendum)
Anesthesia Regional Block:  Femoral nerve block  Pre-Anesthetic Checklist: ,, timeout performed, Correct Patient, Correct Site, Correct Laterality, Correct Procedure, Correct Position, site marked, Risks and benefits discussed,  Surgical consent,  Pre-op evaluation,  At surgeon's request and post-op pain management  Laterality: Right  Prep: chloraprep       Needles:   Needle Type: Other     Needle Length: 9cm  Needle Gauge: 21    Additional Needles:  Procedures: ultrasound guided (picture in chart) Femoral nerve block Narrative:  Start time: 06/26/2012 6:55 AM End time: 06/26/2012 7:01 AM Injection made incrementally with aspirations every 5 mL.  Performed by: Personally  Anesthesiologist: C. Frederick MD  Additional Notes: Ultrasound guidance used to: id relevant anatomy, confirm needle position, local anesthetic spread, avoidance of vascular puncture. Picture saved. No complications. Block performed personally by Janetta Hora. Gelene Mink, MD    Femoral nerve block Procedure Name: Intubation Date/Time: 06/26/2012 7:21 AM Performed by: Rogelia Boga Pre-anesthesia Checklist: Patient identified, Emergency Drugs available, Suction available, Patient being monitored and Timeout performed Patient Re-evaluated:Patient Re-evaluated prior to inductionOxygen Delivery Method: Circle system utilized Preoxygenation: Pre-oxygenation with 100% oxygen Intubation Type: IV induction Ventilation: Mask ventilation without difficulty Laryngoscope Size: Mac and 4 Grade View: Grade I Tube type: Oral Tube size: 7.5 mm Number of attempts: 1 Airway Equipment and Method: Stylet Placement Confirmation: ETT inserted through vocal cords under direct vision,  positive ETCO2 and breath sounds checked- equal and bilateral Secured at: 21 cm Tube secured with: Tape Dental Injury: Teeth and Oropharynx as per pre-operative assessment

## 2012-06-26 NOTE — Anesthesia Preprocedure Evaluation (Addendum)
Anesthesia Evaluation  Patient identified by MRN, date of birth, ID band Patient awake    Reviewed: Allergy & Precautions, H&P , NPO status , Patient's Chart, lab work & pertinent test results, reviewed documented beta blocker date and time   History of Anesthesia Complications (+) PONV  Airway Mallampati: II TM Distance: >3 FB Neck ROM: full    Dental  (+) Dental Advisory Given   Pulmonary neg pulmonary ROS,  breath sounds clear to auscultation        Cardiovascular hypertension, On Medications + dysrhythmias Rhythm:regular     Neuro/Psych PSYCHIATRIC DISORDERS Depression negative neurological ROS     GI/Hepatic Neg liver ROS, GERD-  Medicated and Controlled,  Endo/Other  diabetes, Oral Hypoglycemic Agents  Renal/GU negative Renal ROS  negative genitourinary   Musculoskeletal   Abdominal   Peds  Hematology negative hematology ROS (+)   Anesthesia Other Findings See surgeon's H&P   Reproductive/Obstetrics negative OB ROS                          Anesthesia Physical Anesthesia Plan  ASA: II  Anesthesia Plan: General   Post-op Pain Management:    Induction: Intravenous  Airway Management Planned: Oral ETT  Additional Equipment:   Intra-op Plan:   Post-operative Plan: Extubation in OR  Informed Consent: I have reviewed the patients History and Physical, chart, labs and discussed the procedure including the risks, benefits and alternatives for the proposed anesthesia with the patient or authorized representative who has indicated his/her understanding and acceptance.   Dental Advisory Given  Plan Discussed with: CRNA, Surgeon and Anesthesiologist  Anesthesia Plan Comments:        Anesthesia Quick Evaluation

## 2012-06-26 NOTE — H&P (View-Only) (Signed)
TOTAL KNEE ADMISSION H&P  Patient is being admitted for right total knee arthroplasty.  Subjective:  Chief Complaint:right knee pain.  HPI: Tracy Williams, 67 y.o. female, has a history of pain and functional disability in the right knee due to arthritis and has failed non-surgical conservative treatments for greater than 12 weeks to includeNSAID's and/or analgesics, corticosteriod injections, flexibility and strengthening excercises, supervised PT with diminished ADL's post treatment, use of assistive devices, weight reduction as appropriate and activity modification.  Onset of symptoms was gradual, starting 10 years ago with gradually worsening course since that time. The patient noted no past surgery on the right knee(s).  Patient currently rates pain in the right knee(s) at 10 out of 10 with activity. Patient has night pain, worsening of pain with activity and weight bearing, pain that interferes with activities of daily living, crepitus and joint swelling.  Patient has evidence of subchondral sclerosis, periarticular osteophytes and joint space narrowing by imaging studies.  There is no active infection.  Patient Active Problem List   Diagnosis Date Noted  . 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 Type II or unspecified type diabetes mellitus without mention of complication, uncontrolled 06/09/2012  . Bronchitis 03/19/2011  . General medical examination 11/26/2010  . Elevated BP 11/26/2010  . Depression 11/26/2010  . Colon cancer screening 11/26/2010  . Other and unspecified hyperlipidemia 11/26/2010   Past Medical History  Diagnosis Date  . Arthritis   . GERD (gastroesophageal reflux disease)   . Allergy   . Hypertension   . Right knee DJD     Past Surgical History  Procedure Laterality Date  . Total knee arthroplasty  2010  . Cesarean section  1975     (Not in a hospital admission) No Known Allergies  History  Substance Use Topics  . Smoking status: Never Smoker    . Smokeless tobacco: Not on file  . Alcohol Use: No    Family History  Problem Relation Age of Onset  . Diabetes Mellitus II Father   . Hypertension Father   . Cancer - Other Mother     Breast     Review of Systems  Constitutional: Negative.   HENT: Negative.   Eyes: Negative.   Respiratory: Negative.   Cardiovascular: Negative.   Gastrointestinal: Negative.   Genitourinary: Negative.   Musculoskeletal: Positive for joint pain.  Skin: Negative.   Neurological: Negative.   Endo/Heme/Allergies: Negative.   Psychiatric/Behavioral: Negative.     Objective:  Physical Exam  Constitutional: She is oriented to person, place, and time. She appears well-developed and well-nourished.  HENT:  Head: Normocephalic and atraumatic.  Eyes: Conjunctivae and EOM are normal. Pupils are equal, round, and reactive to light.  Having floaters....seeing eye doctor on the 4th of April  Neck: Normal range of motion. Neck supple.  Cardiovascular: Normal rate and regular rhythm.   Respiratory: Effort normal and breath sounds normal.  GI: Soft. Bowel sounds are normal.  Genitourinary:  Not pertinent to current symptomatology therefore not examined.  Musculoskeletal:  Examination of her right knee reveals 1+ synovitis 1+ crepitation, pain medially mild varus deformity range of motion from -5 to 125 degrees knee is stable with normal patella tracking. Exam of her left knee reveals well healed total knee incision minimal swelling minimal pain range of motion 0-110 degrees knee is stable with normal patella tracking. Vascular exam: pulses 2+ and symmetric.   Neurological: She is alert and oriented to person, place, and time.  Skin: Skin is warm and  dry.  Psychiatric: She has a normal mood and affect. Her behavior is normal.    Vital signs in last 24 hours: Last recorded: 03/26 1500   BP: 166/74 Pulse: 80  Temp: 98.4 F (36.9 C)    Height: 5\' 5"  (1.651 m) SpO2: 96  Weight: 80.74 kg (178 lb)      Labs:   Estimated body mass index is 29.62 kg/(m^2) as calculated from the following:   Height as of this encounter: 5\' 5"  (1.651 m).   Weight as of this encounter: 80.74 kg (178 lb).   Imaging Review Plain radiographs demonstrate severe degenerative joint disease of the right knee(s). The overall alignment ismild valgus. The bone quality appears to be good for age and reported activity level.  Assessment/Plan:  End stage arthritis, right knee   The patient history, physical examination, clinical judgment of the provider and imaging studies are consistent with end stage degenerative joint disease of the right knee(s) and total knee arthroplasty is deemed medically necessary. The treatment options including medical management, injection therapy arthroscopy and arthroplasty were discussed at length. The risks and benefits of total knee arthroplasty were presented and reviewed. The risks due to aseptic loosening, infection, stiffness, patella tracking problems, thromboembolic complications and other imponderables were discussed. The patient acknowledged the explanation, agreed to proceed with the plan and consent was signed. Patient is being admitted for inpatient treatment for surgery, pain control, PT, OT, prophylactic antibiotics, VTE prophylaxis, progressive ambulation and ADL's and discharge planning. The patient is planning to be discharged home with home health services  Reinhardt Licausi A. Gwinda Passe Physician Assistant [facility] [phone]  06/14/2012, 3:30 PM

## 2012-06-26 NOTE — Evaluation (Signed)
Physical Therapy Evaluation Patient Details Name: Tracy Williams MRN: [id number] DOB: 07-15-1945 Today's Date: 06/26/2012 Time: 4098-1191 PT Time Calculation (min): 20 min  PT Assessment / Plan / Recommendation Clinical Impression  Pt is a 67 y.o. female s/p R TKA POD#0. Pt limited in therapy today secondary to nausea/dizziness and pain. Pt tolerated sitting EOB ~4 min and required min A for bed mobility. Pt states she will have husband with her 24/7 upon acute D/C to assist her. Pt wishes to D/C home with HHPT. PT will recommend HHPT and 24/7 care upon acute D/C at this time and f/u pending rehab progress.    PT Assessment  Patient needs continued PT services    Follow Up Recommendations  Home health PT;Supervision/Assistance - 24 hour    Does the patient have the potential to tolerate intense rehabilitation      Barriers to Discharge        Equipment Recommendations  None recommended by PT    Recommendations for Other Services OT consult   Frequency 7X/week    Precautions / Restrictions Precautions Precautions: Fall;Knee Precaution Booklet Issued: Yes (comment) Required Braces or Orthoses: Knee Immobilizer - Right Knee Immobilizer - Right: Discontinue once straight leg raise with < 10 degree lag Restrictions Weight Bearing Restrictions: Yes RLE Weight Bearing: Weight bearing as tolerated   Pertinent Vitals/Pain 7/10 after treatment; pt repositioned, RN notified. C/o nausea and dizziness with movement.       Mobility  Bed Mobility Bed Mobility: Supine to Sit;Sitting - Scoot to [place] of Bed;Sit to Supine;Scooting to [facility] Supine to Sit: 4: Min assist;With rails;HOB elevated Sitting - Scoot to Edge of Bed: 4: Min assist;With rail Sit to Supine: 4: Min assist;With rail Scooting to [facility]: 5: Supervision Details for Bed Mobility Assistance: Pt required min A to advance R LE to EOB. Pt c/o dizziness at EOB, tolerated sitting ~4 min then wanted to return to supine postion. Pt  required cues for hand placement and encouragement to participate; pt limited by pain and dizziness. Transfers Transfers: Not assessed Ambulation/Gait Ambulation/Gait Assistance: Not tested (comment) Stairs: No Wheelchair Mobility Wheelchair Mobility: No    Exercises Total Joint Exercises Ankle Circles/Pumps: AROM;Both;10 reps;Supine   PT Diagnosis: Difficulty walking;Acute pain  PT Problem List: Decreased strength;Decreased range of motion;Decreased activity tolerance;Decreased balance;Decreased mobility;Decreased knowledge of use of DME;Decreased safety awareness;Decreased knowledge of precautions;Pain PT Treatment Interventions: Stair training;Gait training;DME instruction;Functional mobility training;Therapeutic activities;Therapeutic exercise;Balance training;Neuromuscular re-education;Patient/family education   PT Goals Acute Rehab PT Goals PT Goal Formulation: With patient Time For Goal Achievement: 07/03/12 Potential to Achieve Goals: Good Pt will go Supine/Side to Sit: with modified independence PT Goal: Supine/Side to Sit - Progress: Goal set today Pt will go Sit to Supine/Side: with modified independence PT Goal: Sit to Supine/Side - Progress: Goal set today Pt will go Sit to Stand: with modified independence PT Goal: Sit to Stand - Progress: Goal set today Pt will go Stand to Sit: with modified independence PT Goal: Stand to Sit - Progress: Goal set today Pt will Ambulate: >150 feet;with modified independence;with rolling walker PT Goal: Ambulate - Progress: Goal set today Pt will Go Up / Down Stairs: 3-5 stairs;with supervision;with rail(s) PT Goal: Up/Down Stairs - Progress: Goal set today Pt will Perform Home Exercise Program: Independently PT Goal: Perform Home Exercise Program - Progress: Goal set today  Visit Information  Last PT Received On: 06/26/12    Subjective Data  Subjective: nobody would want to be me today. i feel terrible  Patient Stated Goal: to go  home with husband   Prior Functioning  Home Living Lives With: Spouse Available Help at Discharge: Family;Available 24 hours/day Type of Home: House Home Access: Stairs to enter [employer] of Steps: 5 Entrance Stairs-Rails: Right Home Layout: One level Bathroom Shower/Tub: Engineer, manufacturing systems: Standard Bathroom Accessibility: Yes How Accessible: Accessible via walker Home Adaptive Equipment: Bedside commode/3-in-1;Shower chair with back;Walker - rolling Additional Comments: pt states she has all adaptive equipment she needs Prior Function Level of Independence: Independent Able to Take Stairs?: Yes Driving: Yes Vocation: Retired Musician: No difficulties Dominant Hand: Right    Cognition  Cognition Overall Cognitive Status: Appears within functional limits for tasks assessed/performed Arousal/Alertness: Awake/alert Orientation Level: Appears intact for tasks assessed Behavior During Session: [facility] for tasks performed    Extremity/Trunk Assessment Right Lower Extremity Assessment RLE ROM/Strength/Tone: Deficits;Unable to fully assess;Due to pain;Due to precautions RLE ROM/Strength/Tone Deficits: decreased secondary to surgery and pain RLE Sensation: WFL - Light Touch (on metatarsals ) Left Lower Extremity Assessment LLE ROM/Strength/Tone: Within functional levels LLE Sensation: WFL - Light Touch   Balance Balance Balance Assessed: Yes Static Sitting Balance Static Sitting - Balance Support: Left upper extremity supported;Feet unsupported Static Sitting - Level of Assistance: 5: Stand by assistance  End of Session PT - End of Session Equipment Utilized During Treatment: Right knee immobilizer Activity Tolerance: Patient limited by fatigue;Patient limited by pain;Other (comment) (limited by dizziness and nausea ) Patient left: in bed;with call bell/phone within reach Nurse Communication: Mobility status CPM Right Knee CPM Right  Knee: Off Right Knee Flexion (Degrees): 60 Right Knee Extension (Degrees): 0  GP     Tracy Williams [place], [place] [phone] 06/26/2012, 3:35 PM

## 2012-06-26 NOTE — Preoperative (Signed)
Beta Blockers   Reason not to administer Beta Blockers:Not Applicable 

## 2012-06-26 NOTE — Plan of Care (Signed)
Problem: Consults Goal: Diagnosis- Total Joint Replacement Primary Total Knee     

## 2012-06-26 NOTE — Progress Notes (Signed)
UR COMPLETED  

## 2012-06-27 ENCOUNTER — Encounter (HOSPITAL_COMMUNITY): Payer: Self-pay | Admitting: General Practice

## 2012-06-27 DIAGNOSIS — M1711 Unilateral primary osteoarthritis, right knee: Secondary | ICD-10-CM | POA: Diagnosis present

## 2012-06-27 LAB — CBC
HCT: 31 % — ABNORMAL LOW (ref 36.0–46.0)
MCH: 28.6 pg (ref 26.0–34.0)
MCV: 83.8 fL (ref 78.0–100.0)
Platelets: 213 10*3/uL (ref 150–400)
RBC: 3.7 MIL/uL — ABNORMAL LOW (ref 3.87–5.11)
WBC: 16.4 10*3/uL — ABNORMAL HIGH (ref 4.0–10.5)

## 2012-06-27 LAB — BASIC METABOLIC PANEL
BUN: 15 mg/dL (ref 6–23)
CO2: 27 mEq/L (ref 19–32)
Calcium: 8.4 mg/dL (ref 8.4–10.5)
Chloride: 104 mEq/L (ref 96–112)
Creatinine, Ser: 0.73 mg/dL (ref 0.50–1.10)
Glucose, Bld: 208 mg/dL — ABNORMAL HIGH (ref 70–99)

## 2012-06-27 LAB — URINE CULTURE

## 2012-06-27 MED ORDER — TRAMADOL HCL 50 MG PO TABS: 50.0000 mg | ORAL_TABLET | Freq: Four times a day (QID) | ORAL | Status: DC | PRN

## 2012-06-27 MED ORDER — ENOXAPARIN SODIUM 30 MG/0.3ML ~~LOC~~ SOLN: 30.0000 mg | Freq: Two times a day (BID) | SUBCUTANEOUS | Status: DC

## 2012-06-27 MED ORDER — DSS 100 MG PO CAPS: ORAL_CAPSULE | ORAL | Status: DC

## 2012-06-27 MED ORDER — CELECOXIB 200 MG PO CAPS: ORAL_CAPSULE | ORAL | Status: DC

## 2012-06-27 MED ORDER — BISACODYL 5 MG PO TBEC: DELAYED_RELEASE_TABLET | ORAL | Status: DC

## 2012-06-27 NOTE — Progress Notes (Signed)
Physical Therapy Treatment Patient Details Name: Tracy Williams MRN: [id number] DOB: [birth year] Today's Date: 06/27/2012 Time: 4098-1191 PT Time Calculation (min): 26 min  PT Assessment / Plan / Recommendation Comments on Treatment Session  Pt progressing towards therapy goals. Moving well today. Plans to D/C home this afternoon per PA. Recommend HHPT upon acute D/C. Pt denies any DME needs. Will cont to f/u with pt to improve independence with mobility and overall strength in acute stay.     Follow Up Recommendations  Home health PT;Supervision/Assistance - 24 hour     Does the patient have the potential to tolerate intense rehabilitation     Barriers to Discharge        Equipment Recommendations  None recommended by PT    Recommendations for Other Services    Frequency 7X/week   Plan Discharge plan remains appropriate;Frequency remains appropriate    Precautions / Restrictions Precautions Precautions: Fall;Knee Precaution Booklet Issued: Yes (comment) Required Braces or Orthoses: Knee Immobilizer - Right Knee Immobilizer - Right: Discontinue once straight leg raise with < 10 degree lag Restrictions Weight Bearing Restrictions: Yes RLE Weight Bearing: Weight bearing as tolerated   Pertinent Vitals/Pain 3/10 at rest; pt given ice pack to reduce pain and repositioned in chair.     Mobility  Bed Mobility Bed Mobility: Not assessed Transfers Transfers: Sit to Stand;Stand to Sit Sit to Stand: 5: Supervision;With armrests;From chair/3-in-1 Stand to Sit: 5: Supervision;To chair/3-in-1;With armrests Details for Transfer Assistance: pt demo safe technique with transfers; required min cues for hand placement  Ambulation/Gait Ambulation/Gait Assistance: 5: Supervision Ambulation Distance (Feet): 200 Feet Assistive device: Rolling walker Ambulation/Gait Assistance Details: cues for gt sequencing and upright posture; pt began with step to gt and progressed to step through gt  with cues; demo safe technique with RW  Gait Pattern: Step-through pattern;Wide base of support Gait velocity: decreased Stairs: Yes Stairs Assistance: 5: Supervision Stairs Assistance Details (indicate cue type and reason): cues for  sequencing and hand placement <25% of time; good recall of gt sequencing from precious TKA Stair Management Technique: One rail Right;Forwards;Step to pattern Number of Stairs: 5 Wheelchair Mobility Wheelchair Mobility: No    Exercises Total Joint Exercises Ankle Circles/Pumps: AROM;Both;10 reps;Seated Quad Sets: AROM;Right;10 reps;Seated Heel Slides: AROM;Right;10 reps;Seated Long Arc Quad: AROM;10 reps;Right;Seated   PT Diagnosis:    PT Problem List:   PT Treatment Interventions:     PT Goals Acute Rehab PT Goals PT Goal Formulation: With patient Time For Goal Achievement: 07/03/12 Potential to Achieve Goals: Good PT Goal: Sit to Stand - Progress: Progressing toward goal PT Goal: Stand to Sit - Progress: Progressing toward goal PT Goal: Ambulate - Progress: Progressing toward goal PT Goal: Up/Down Stairs - Progress: Progressing toward goal PT Goal: Perform Home Exercise Program - Progress: Progressing toward goal  Visit Information  Last PT Received On: 06/27/12 Assistance Needed: +1    Subjective Data  Subjective: I am doing so much better.  Patient Stated Goal: to go home with husband   Cognition  Cognition Overall Cognitive Status: Appears within functional limits for tasks assessed/performed Arousal/Alertness: Awake/alert Orientation Level: Appears intact for tasks assessed Behavior During Session: [facility] for tasks performed    Balance  Balance Balance Assessed: No  End of Session PT - End of Session Equipment Utilized During Treatment: Gait belt;Other (comment) (D/C knee immobilizer per PA; pt demo adequate quad control) Activity Tolerance: Patient tolerated treatment well Patient left: in chair;with call bell/phone within  reach Nurse Communication: Mobility status  GP     Shelva Majestic [place], [place] [phone] 06/27/2012, 8:50 AM

## 2012-06-27 NOTE — Discharge Summary (Signed)
Patient ID: Tracy Williams MRN: [id number] DOB/AGE: 1945/05/10 67 y.o.  Admit date: 06/26/2012 Discharge date: 06/27/2012  Admission Diagnoses:  Principal Problem:   Right knee DJD Active Problems:   Depression   Other and unspecified hyperlipidemia   Osteoporosis, post-menopausal   Type II or unspecified type diabetes mellitus without mention of complication, uncontrolled   Discharge Diagnoses:  Same  Past Medical History  Diagnosis Date  . Arthritis   . GERD (gastroesophageal reflux disease)   . Allergy   . Hypertension   . Right knee DJD   . PONV (postoperative nausea and vomiting)   . Dysrhythmia     Hx: of palpitations in 2010 only  . Diabetes mellitus without complication     Surgeries: Procedure(s): TOTAL KNEE ARTHROPLASTY- right on 06/26/2012   Consultants:    Discharged Condition: Improved  Hospital Course: SHARYL PANCHAL is an 67 y.o. female who was admitted 06/26/2012 for operative treatment ofRight knee DJD. Patient has severe unremitting pain that affects sleep, daily activities, and work/hobbies. After pre-op clearance the patient was taken to the operating room on 06/26/2012 and underwent  Procedure(s): TOTAL KNEE ARTHROPLASTY- right.    Patient was given perioperative antibiotics: Anti-infectives   Start     Dose/Rate Route Frequency Ordered Stop   06/26/12 0747  cefUROXime (ZINACEF) injection  Status:  Discontinued       As needed 06/26/12 0748 06/26/12 0905   06/26/12 0712  ceFAZolin (ANCEF) 2-3 GM-% IVPB SOLR    Comments:  MUELLER, TOM: cabinet override      06/26/12 0712 06/26/12 1914   06/26/12 0600  ceFAZolin (ANCEF) IVPB 2 g/50 mL premix     2 g 100 mL/hr over 30 Minutes Intravenous On call to O.R. 06/25/12 1315 06/26/12 0723       Patient was given sequential compression devices, early ambulation, and chemoprophylaxis to prevent DVT.  Patient benefited maximally from hospital stay and there were no complications.    Recent vital  signs: Patient Vitals for the past 24 hrs:  BP Temp Temp src Pulse Resp SpO2  06/27/12 0511 142/64 mmHg 98.6 F (37 C) Oral 83 16 98 %  06/26/12 2012 141/71 mmHg 97.7 F (36.5 C) Oral 88 16 99 %  06/26/12 1500 137/55 mmHg 97.4 F (36.3 C) - 68 16 98 %  06/26/12 1200 - - - - 13 100 %  06/26/12 1150 135/52 mmHg 96.8 F (36 C) - 67 10 99 %  06/26/12 1130 158/56 mmHg 96.7 F (35.9 C) - 47 9 100 %  06/26/12 1115 143/53 mmHg - - 52 11 100 %  06/26/12 1100 144/57 mmHg - - 56 11 100 %  06/26/12 1045 137/57 mmHg - - 44 9 100 %  06/26/12 1030 141/67 mmHg - - 49 10 100 %  06/26/12 1015 175/64 mmHg - - 51 11 92 %  06/26/12 1000 174/73 mmHg - - 49 11 100 %  06/26/12 0945 166/73 mmHg - - 70 17 100 %  06/26/12 0930 157/81 mmHg - - 81 16 100 %  06/26/12 0915 - - - 79 18 100 %  06/26/12 0913 157/83 mmHg 96.8 F (36 C) - 84 11 99 %     Recent laboratory studies:  Recent Labs  06/26/12 1538 06/27/12 0555  WBC 14.3* 16.4*  HGB 11.1* 10.6*  HCT 31.8* 31.0*  PLT 194 213  NA  --  137  K  --  4.9  CL  --  104  CO2  --  27  BUN  --  15  CREATININE 0.74 0.73  GLUCOSE  --  208*  CALCIUM  --  8.4     Discharge Medications:     Medication List    TAKE these medications       acetaminophen 500 MG tablet  Commonly known as:  TYLENOL  Take 1,500 mg by mouth daily as needed for pain.     atorvastatin 20 MG tablet  Commonly known as:  LIPITOR  Take 1 tablet (20 mg total) by mouth at bedtime.     bisacodyl 5 MG EC tablet  Commonly known as:  DULCOLAX  Take 2 tablets every night with dinner until bowel movement.  LAXITIVE.  Restart if two days since last bowel movement     celecoxib 200 MG capsule  Commonly known as:  CELEBREX  1 tablet daily with for for pain and swelling     DSS 100 MG Caps  1 tab 2 times a day while on narcotics.  STOOL SOFTENER     enoxaparin 30 MG/0.3ML injection  Commonly known as:  LOVENOX  Inject 0.3 mLs (30 mg total) into the skin every 12 (twelve)  hours.     traMADol 50 MG tablet  Commonly known as:  ULTRAM  Take 1-2 tablets (50-100 mg total) by mouth every 6 (six) hours as needed for pain.        Diagnostic Studies: Dg Chest 2 View  06/22/2012  *RADIOLOGY REPORT*  Clinical Data: Degenerative joint disease in the right knee. Preoperative study for right knee surgery.  CHEST - 2 VIEW  Comparison: Chest x-ray 08/14/2008.  Findings: Lung volumes are normal.  No consolidative airspace disease.  No pleural effusions.  No pneumothorax.  No pulmonary nodule or mass noted.  Pulmonary vasculature and the cardiomediastinal silhouette are within normal limits.  IMPRESSION: 1. No radiographic evidence of acute cardiopulmonary disease.   Original Report Authenticated By: Trudie Reed, M.D.     Disposition:       Discharge Orders   Future Appointments Provider Department Dept Phone   10/09/2012 9:15 AM Sheliah Hatch, MD [facility]  [place] [phone]   Future Orders Complete By Expires     CPM  As directed     Comments:      Continuous passive motion machine (CPM):      Use the CPM from 0 to 90 for 6 hours per day.       You may break it up into 2 or 3 sessions per day.      Use CPM for 2 weeks or until you are told to stop.    Call MD / Call 911  As directed     Comments:      If you experience chest pain or shortness of breath, CALL 911 and be transported to the hospital emergency room.  If you develope a fever above 101 F, pus (white drainage) or increased drainage or redness at the wound, or calf pain, call your surgeon's office.    Change dressing  As directed     Comments:      Change the dressing daily with sterile 4 x 4 inch gauze dressing and apply TED hose.  You may clean the incision with alcohol prior to redressing.    Constipation Prevention  As directed     Comments:      Drink plenty of fluids.  Prune juice may be helpful.  You may use a stool softener, such  as Colace (over the counter) 100 mg  twice a day.  Use MiraLax (over the counter) for constipation as needed.    Diet - low sodium heart healthy  As directed     Discharge instructions  As directed     Comments:      Total Knee Replacement Care After Refer to this sheet in the next few weeks. These discharge instructions provide you with general information on caring for yourself after you leave the hospital. Your caregiver may also give you specific instructions. Your treatment has been planned according to the most current medical practices available, but unavoidable complications sometimes occur. If you have any problems or questions after discharge, please call your caregiver. Regaining a near full range of motion of your knee within the first 3 to 6 weeks after surgery is critical. HOME CARE INSTRUCTIONS  You may resume a normal diet and activities as directed.  Perform exercises as directed.  Place yellow foam block, yellow side up under heel at all times except when in CPM or when walking.  DO NOT modify, tear, cut, or change in any way. You will receive physical therapy daily  Take showers instead of baths until informed otherwise.  Change bandages (dressings)daily Do not take over-the-counter or prescription medicines for pain, discomfort, or fever. Eat a well-balanced diet.  Avoid lifting or driving until you are instructed otherwise.  Make an appointment to see your caregiver for stitches (suture) or staple removal as directed.  If you have been sent home with a continuous passive motion machine (CPM machine), 0-90 degrees 6 hrs a day   2 hrs a shift SEEK MEDICAL CARE IF: You have swelling of your calf or leg.  You develop shortness of breath or chest pain.  You have redness, swelling, or increasing pain in the wound.  There is pus or any unusual drainage coming from the surgical site.  You notice a bad smell coming from the surgical site or dressing.  The surgical site breaks open after sutures or staples have been  removed.  There is persistent bleeding from the suture or staple line.  You are getting worse or are not improving.  You have any other questions or concerns.  SEEK IMMEDIATE MEDICAL CARE IF:  You have a fever.  You develop a rash.  You have difficulty breathing.  You develop any reaction or side effects to medicines given.  Your knee motion is decreasing rather than improving.  MAKE SURE YOU:  Understand these instructions.  Will watch your condition.  Will get help right away if you are not doing well or get worse.    Do not put a pillow under the knee. Place it under the heel.  As directed     Comments:      Place gray foam block, yellow side up under heel at all times except when in CPM or when walking.  DO NOT modify, tear, cut, or change in any way the gray foam block.    Increase activity slowly as tolerated  As directed     TED hose  As directed     Comments:      Use stockings (TED hose) for 2 weeks on both leg(s).  You may remove them at night for sleeping.       Follow-up Information   Follow up with Nilda Simmer, MD On 07/10/2012. (appt time 2 pm)    Contact information:   [address] [place] [address]  [phone]        Signed: Pascal Lux 06/27/2012, 8:42 AM

## 2012-06-27 NOTE — Op Note (Signed)
MRN:     [id number] DOB/AGE:    June 08, 1945 / 67 y.o.       OPERATIVE REPORT    DATE OF PROCEDURE:  06/27/2012       PREOPERATIVE DIAGNOSIS:   DJD RIGHT KNEE      Estimated body mass index is 28.69 kg/(m^2) as calculated from the following:   Height as of 06/22/12: 5\' 5"  (1.651 m).   Weight as of 03/19/11: 78.2 kg (172 lb 6.4 oz).                                                        POSTOPERATIVE DIAGNOSIS:   DJD RIGHT KNEE                                                                      PROCEDURE:  Procedure(s): TOTAL KNEE ARTHROPLASTY- right Using Depuy Sigma RP implants #2.5 Femur, #3Tibia, 12.7mm sigma RP bearing, 32 Patella     SURGEON: Ferlin Fairhurst A    ASSISTANT:  Kirstin Shepperson PA-C   (Present and scrubbed throughout the case, critical for assistance with exposure, retraction, instrumentation, and closure.)         ANESTHESIA: GET with Femoral Nerve Block  DRAINS: foley, 2 medium hemovac in knee   TOURNIQUET TIME:   COMPLICATIONS:  None     SPECIMENS: None   INDICATIONS FOR PROCEDURE: The patient has  DJD RIGHT KNEE, varus deformities, XR shows bone on bone arthritis. Patient has failed all conservative measures including anti-inflammatory medicines, narcotics, attempts at  exercise and weight loss, cortisone injections and viscosupplementation.  Risks and benefits of surgery have been discussed, questions answered.   DESCRIPTION OF PROCEDURE: The patient identified by armband, received  right femoral nerve block and IV antibiotics, in the holding area at [facility]. Patient taken to the operating room, appropriate anesthetic  monitors were attached General endotracheal anesthesia induced with  the patient in supine position, Foley catheter was inserted. Tourniquet  applied high to the operative thigh. Lateral post and foot positioner  applied to the table, the lower extremity was then prepped and draped  in usual sterile fashion from the ankle to the  tourniquet. Time-out procedure was performed. The limb was wrapped with an Esmarch bandage and the tourniquet inflated to 365 mmHg. We began the operation by making the anterior midline incision starting at handbreadth above the patella going over the patella 1 cm medial to and  4 cm distal to the tibial tubercle. Small bleeders in the skin and the  subcutaneous tissue identified and cauterized. Transverse retinaculum was incised and reflected medially and a medial parapatellar arthrotomy was accomplished. the patella was everted and theprepatellar fat pad resected. The superficial medial collateral  ligament was then elevated from anterior to posterior along the proximal  flare of the tibia and anterior half of the menisci resected. The knee was hyperflexed exposing bone on bone arthritis. Peripheral and notch osteophytes as well as the cruciate ligaments were then resected. We continued to  work our way around posteriorly along the proximal tibia, and  externally  rotated the tibia subluxing it out from underneath the femur. A McHale  retractor was placed through the notch and a lateral Hohmann retractor  placed, and we then drilled through the proximal tibia in line with the  axis of the tibia followed by an intramedullary guide rod and 2-degree  posterior slope cutting guide. The tibial cutting guide was pinned into place  allowing resection of 4 mm of bone medially and about 6 mm of bone  laterally because of her varus deformity. Satisfied with the tibial resection, we then  entered the distal femur 2 mm anterior to the PCL origin with the  intramedullary guide rod and applied the distal femoral cutting guide  set at 11mm, with 5 degrees of valgus. This was pinned along the  epicondylar axis. At this point, the distal femoral cut was accomplished without difficulty. We then sized for a #2.5 femoral component and pinned the guide in 3 degrees of external rotation.The chamfer cutting guide was  pinned into place. The anterior, posterior, and chamfer cuts were accomplished without difficulty followed by  the Sigma RP box cutting guide and the box cut. We also removed posterior osteophytes from the posterior femoral condyles. At this  time, the knee was brought into full extension. We checked our  extension and flexion gaps and found them symmetric at 12.22mm.  The patella thickness measured at 21 mm. We set the cutting guide at 13 and removed the posterior 8 mm  of the patella sized for 32 button and drilled the lollipop. The knee  was then once again hyperflexed exposing the proximal tibia. We sized for a #3 tibial base plate, applied the smokestack and the conical reamer followed by the the Delta fin keel punch. We then hammered into place the Sigma RP trial femoral component, inserted a 12.5-mm trial bearing, trial patellar button, and took the knee through range of motion from 0-130 degrees. No thumb pressure was required for patellar  tracking. At this point, all trial components were removed, a double batch of DePuy HV cement with 1500 mg of Zinacef was mixed and applied to all bony metallic mating surfaces except for the posterior condyles of the femur itself. In order, we  hammered into place the tibial tray and removed excess cement, the femoral component and removed excess cement, a 10-mm Sigma RP bearing  was inserted, and the knee brought to full extension with compression.  The patellar button was clamped into place, and excess cement  removed. While the cement cured the wound was irrigated out with normal saline solution pulse lavage, and medium Hemovac drains were placed.. Ligament stability and patellar tracking were checked and found to be excellent. The tourniquet was then released and hemostasis was obtained with cautery. The parapatellar arthrotomy was closed with  #1 ethibond suture. The subcutaneous tissue with 0 and 2-0 undyed  Vicryl suture, and 4-0 Monocryl.. A dressing  of Xeroform,  4 x 4, dressing sponges, Webril, and Ace wrap applied. Needle and sponge count were correct times 2.The patient awakened, extubated, and taken to recovery room without difficulty. Vascular status was normal, pulses 2+ and symmetric.   Tracy Williams A 06/27/2012, 1:39 PM

## 2012-06-27 NOTE — Progress Notes (Signed)
Physical Therapy Treatment Patient Details Name: Tracy Williams MRN: [id number] DOB: 12-12-1945 Today's Date: 06/27/2012 Time: 9629-5284 PT Time Calculation (min): 15 min  PT Assessment / Plan / Recommendation Comments on Treatment Session  Pt moving very well today. Antcipates D/C this afternoon. Pt is safe from PT standpoint to D/C with family care and HHPT.     Follow Up Recommendations  Home health PT;Supervision/Assistance - 24 hour     Does the patient have the potential to tolerate intense rehabilitation     Barriers to Discharge        Equipment Recommendations  None recommended by PT    Recommendations for Other Services    Frequency 7X/week   Plan Discharge plan remains appropriate;Frequency remains appropriate    Precautions / Restrictions Precautions Precautions: Knee Precaution Booklet Issued: Yes (comment) Required Braces or Orthoses: Knee Immobilizer - Right Knee Immobilizer - Right: Discontinue once straight leg raise with < 10 degree lag Restrictions Weight Bearing Restrictions: Yes RLE Weight Bearing: Weight bearing as tolerated   Pertinent Vitals/Pain 5/10 pt repositioned in CPM; reports CPM was tolerable.     Mobility  Bed Mobility Bed Mobility: Sit to Supine;Sitting - Scoot to Edge of Bed;Supine to Sit Supine to Sit: 6: Modified independent (Device/Increase time);With rails Sitting - Scoot to Edge of Bed: 6: Modified independent (Device/Increase time) Sit to Supine: 6: Modified independent (Device/Increase time) Details for Bed Mobility Assistance: pt demo good technique with bed mobility  Transfers Transfers: Sit to Stand;Stand to Sit Sit to Stand: 5: Supervision;From bed Stand to Sit: 6: Modified independent (Device/Increase time);To bed Details for Transfer Assistance: pt demo safe technique with transfers; required cues for hand placement <10% of time Ambulation/Gait Ambulation/Gait Assistance: 5: Supervision Ambulation Distance (Feet):  400 Feet Assistive device: Rolling walker Ambulation/Gait Assistance Details: min cues for upright posture; able to converse well during amb; demo safe technique with RW Gait Pattern: Step-through pattern;Decreased stride length Gait velocity: decreased Stairs: No Stairs Assistance: 5: Supervision Stairs Assistance Details (indicate cue type and reason): cues for  sequencing and hand placement <25% of time; good recall of gt sequencing from precious TKA Stair Management Technique: One rail Right;Forwards;Step to pattern Number of Stairs: 5 Wheelchair Mobility Wheelchair Mobility: No    Exercises Total Joint Exercises Ankle Circles/Pumps: AROM;Both;10 reps;Seated Quad Sets: AROM;Right;10 reps;Seated Heel Slides: AROM;Right;10 reps;Seated Long Arc Quad: AROM;10 reps;Right;Seated   PT Diagnosis:    PT Problem List:   PT Treatment Interventions:     PT Goals Acute Rehab PT Goals PT Goal Formulation: With patient Time For Goal Achievement: 07/03/12 Potential to Achieve Goals: Good PT Goal: Supine/Side to Sit - Progress: Met PT Goal: Sit to Supine/Side - Progress: Met PT Goal: Sit to Stand - Progress: Progressing toward goal PT Goal: Stand to Sit - Progress: Progressing toward goal PT Goal: Ambulate - Progress: Progressing toward goal PT Goal: Up/Down Stairs - Progress: Progressing toward goal Pt will Perform Home Exercise Program: Independently PT Goal: Perform Home Exercise Program - Progress: Progressing toward goal  Visit Information  Last PT Received On: 06/27/12 Assistance Needed: +1    Subjective Data  Subjective: I am going home around 2. Id like to walk again.  Patient Stated Goal: to go home with husband   Cognition  Cognition Overall Cognitive Status: Appears within functional limits for tasks assessed/performed Arousal/Alertness: Awake/alert Orientation Level: Appears intact for tasks assessed Behavior During Session: [facility] for tasks performed    Balance   Balance Balance Assessed: No  End of Session PT - End of Session Equipment Utilized During Treatment: Gait belt Activity Tolerance: Patient tolerated treatment well Patient left: in chair;with call bell/phone within reach;in CPM;Other (comment) (CPM on 0 to 65degrees; pt tolerating range ) Nurse Communication: Mobility status CPM Right Knee CPM Right Knee: Off Right Knee Flexion (Degrees): 60 Right Knee Extension (Degrees): 0   GP     Shelva Majestic [place], [place] [phone] 06/27/2012, 11:15 AM

## 2012-06-27 NOTE — Plan of Care (Signed)
Problem: Consults Goal: Diagnosis- Total Joint Replacement Primary Total Knee Right     

## 2012-06-27 NOTE — Care Management Note (Signed)
CARE MANAGEMENT NOTE 06/27/2012  Patient:  Tracy Williams, Tracy Williams   Account Number:  [id number]  Date Initiated:  06/27/2012  Documentation initiated by:  Vance Peper  Subjective/Objective Assessment:   67 yr old female s/p right total knee arthroplasty.     Action/Plan:   CM spoke with patient concerning home health and DME needs at discharge. patient was preoperatively setup with [facility], no changes. She has rooling walker, 3in1 and CPM has been delivered. Patient has family support at discharge.   Anticipated DC Date:  06/27/2012   Anticipated DC Plan:  HOME W HOME HEALTH SERVICES      DC Planning Services  CM consult      [facility] Choice  HOME HEALTH   Choice offered to / List presented to:  C-1 Patient        HH arranged  HH-2 PT      [facility] agency  [facility]   Status of service:  Completed, signed off Medicare Important Message given?   (If response is "NO", the following Medicare IM given date fields will be blank) Date Medicare IM given:   Date Additional Medicare IM given:    Discharge Disposition:  HOME W HOME HEALTH SERVICES  Per UR Regulation:    If discussed at Long Length of Stay Meetings, dates discussed:    Comments:

## 2012-06-27 NOTE — Progress Notes (Signed)
Pt provided with discharge instructions and follow up information. She has been educated on use of Lovenox injection to prevent blood clots. She is set up with [facility] home [facility].

## 2012-06-27 NOTE — Progress Notes (Signed)
OT Cancellation Note  Patient Details Name: DELSY ETZKORN MRN: [id number] DOB: 07-Apr-1945   Cancelled Treatment:    Reason Eval/Treat Not Completed: Other (comment) (screen) Order received, reviewed chart and spoke with pt.  No acute OT needs identified. Will sign off.     Earlie Raveling OTR/L [phone]  06/27/2012, 11:37 AM

## 2012-06-28 ENCOUNTER — Encounter (HOSPITAL_COMMUNITY): Payer: Self-pay | Admitting: Orthopedic Surgery

## 2012-06-28 DIAGNOSIS — Z96659 Presence of unspecified artificial knee joint: Secondary | ICD-10-CM | POA: Diagnosis not present

## 2012-06-28 DIAGNOSIS — Z471 Aftercare following joint replacement surgery: Secondary | ICD-10-CM | POA: Diagnosis not present

## 2012-06-28 DIAGNOSIS — F329 Major depressive disorder, single episode, unspecified: Secondary | ICD-10-CM | POA: Diagnosis not present

## 2012-06-28 DIAGNOSIS — IMO0001 Reserved for inherently not codable concepts without codable children: Secondary | ICD-10-CM | POA: Diagnosis not present

## 2012-06-28 DIAGNOSIS — E119 Type 2 diabetes mellitus without complications: Secondary | ICD-10-CM | POA: Diagnosis not present

## 2012-06-29 DIAGNOSIS — Z96659 Presence of unspecified artificial knee joint: Secondary | ICD-10-CM | POA: Diagnosis not present

## 2012-06-29 DIAGNOSIS — F329 Major depressive disorder, single episode, unspecified: Secondary | ICD-10-CM | POA: Diagnosis not present

## 2012-06-29 DIAGNOSIS — IMO0001 Reserved for inherently not codable concepts without codable children: Secondary | ICD-10-CM | POA: Diagnosis not present

## 2012-06-29 DIAGNOSIS — Z471 Aftercare following joint replacement surgery: Secondary | ICD-10-CM | POA: Diagnosis not present

## 2012-06-29 DIAGNOSIS — E119 Type 2 diabetes mellitus without complications: Secondary | ICD-10-CM | POA: Diagnosis not present

## 2012-06-30 DIAGNOSIS — F329 Major depressive disorder, single episode, unspecified: Secondary | ICD-10-CM | POA: Diagnosis not present

## 2012-06-30 DIAGNOSIS — E119 Type 2 diabetes mellitus without complications: Secondary | ICD-10-CM | POA: Diagnosis not present

## 2012-06-30 DIAGNOSIS — Z471 Aftercare following joint replacement surgery: Secondary | ICD-10-CM | POA: Diagnosis not present

## 2012-06-30 DIAGNOSIS — IMO0001 Reserved for inherently not codable concepts without codable children: Secondary | ICD-10-CM | POA: Diagnosis not present

## 2012-06-30 DIAGNOSIS — Z96659 Presence of unspecified artificial knee joint: Secondary | ICD-10-CM | POA: Diagnosis not present

## 2012-07-03 DIAGNOSIS — Z471 Aftercare following joint replacement surgery: Secondary | ICD-10-CM | POA: Diagnosis not present

## 2012-07-03 DIAGNOSIS — E119 Type 2 diabetes mellitus without complications: Secondary | ICD-10-CM | POA: Diagnosis not present

## 2012-07-03 DIAGNOSIS — F329 Major depressive disorder, single episode, unspecified: Secondary | ICD-10-CM | POA: Diagnosis not present

## 2012-07-03 DIAGNOSIS — IMO0001 Reserved for inherently not codable concepts without codable children: Secondary | ICD-10-CM | POA: Diagnosis not present

## 2012-07-03 DIAGNOSIS — Z96659 Presence of unspecified artificial knee joint: Secondary | ICD-10-CM | POA: Diagnosis not present

## 2012-07-04 DIAGNOSIS — Z96659 Presence of unspecified artificial knee joint: Secondary | ICD-10-CM | POA: Diagnosis not present

## 2012-07-04 DIAGNOSIS — E119 Type 2 diabetes mellitus without complications: Secondary | ICD-10-CM | POA: Diagnosis not present

## 2012-07-04 DIAGNOSIS — F329 Major depressive disorder, single episode, unspecified: Secondary | ICD-10-CM | POA: Diagnosis not present

## 2012-07-04 DIAGNOSIS — Z471 Aftercare following joint replacement surgery: Secondary | ICD-10-CM | POA: Diagnosis not present

## 2012-07-04 DIAGNOSIS — IMO0001 Reserved for inherently not codable concepts without codable children: Secondary | ICD-10-CM | POA: Diagnosis not present

## 2012-07-05 DIAGNOSIS — IMO0001 Reserved for inherently not codable concepts without codable children: Secondary | ICD-10-CM | POA: Diagnosis not present

## 2012-07-05 DIAGNOSIS — Z471 Aftercare following joint replacement surgery: Secondary | ICD-10-CM | POA: Diagnosis not present

## 2012-07-05 DIAGNOSIS — F329 Major depressive disorder, single episode, unspecified: Secondary | ICD-10-CM | POA: Diagnosis not present

## 2012-07-05 DIAGNOSIS — Z96659 Presence of unspecified artificial knee joint: Secondary | ICD-10-CM | POA: Diagnosis not present

## 2012-07-05 DIAGNOSIS — E119 Type 2 diabetes mellitus without complications: Secondary | ICD-10-CM | POA: Diagnosis not present

## 2012-07-06 DIAGNOSIS — E119 Type 2 diabetes mellitus without complications: Secondary | ICD-10-CM | POA: Diagnosis not present

## 2012-07-06 DIAGNOSIS — Z96659 Presence of unspecified artificial knee joint: Secondary | ICD-10-CM | POA: Diagnosis not present

## 2012-07-06 DIAGNOSIS — Z471 Aftercare following joint replacement surgery: Secondary | ICD-10-CM | POA: Diagnosis not present

## 2012-07-06 DIAGNOSIS — IMO0001 Reserved for inherently not codable concepts without codable children: Secondary | ICD-10-CM | POA: Diagnosis not present

## 2012-07-06 DIAGNOSIS — F329 Major depressive disorder, single episode, unspecified: Secondary | ICD-10-CM | POA: Diagnosis not present

## 2012-07-07 DIAGNOSIS — Z471 Aftercare following joint replacement surgery: Secondary | ICD-10-CM | POA: Diagnosis not present

## 2012-07-07 DIAGNOSIS — IMO0001 Reserved for inherently not codable concepts without codable children: Secondary | ICD-10-CM | POA: Diagnosis not present

## 2012-07-07 DIAGNOSIS — Z96659 Presence of unspecified artificial knee joint: Secondary | ICD-10-CM | POA: Diagnosis not present

## 2012-07-07 DIAGNOSIS — E119 Type 2 diabetes mellitus without complications: Secondary | ICD-10-CM | POA: Diagnosis not present

## 2012-07-07 DIAGNOSIS — F329 Major depressive disorder, single episode, unspecified: Secondary | ICD-10-CM | POA: Diagnosis not present

## 2012-07-10 DIAGNOSIS — IMO0001 Reserved for inherently not codable concepts without codable children: Secondary | ICD-10-CM | POA: Diagnosis not present

## 2012-07-10 DIAGNOSIS — Z96659 Presence of unspecified artificial knee joint: Secondary | ICD-10-CM | POA: Diagnosis not present

## 2012-07-10 DIAGNOSIS — F329 Major depressive disorder, single episode, unspecified: Secondary | ICD-10-CM | POA: Diagnosis not present

## 2012-07-10 DIAGNOSIS — E119 Type 2 diabetes mellitus without complications: Secondary | ICD-10-CM | POA: Diagnosis not present

## 2012-07-10 DIAGNOSIS — Z471 Aftercare following joint replacement surgery: Secondary | ICD-10-CM | POA: Diagnosis not present

## 2012-07-11 DIAGNOSIS — F329 Major depressive disorder, single episode, unspecified: Secondary | ICD-10-CM | POA: Diagnosis not present

## 2012-07-11 DIAGNOSIS — Z96659 Presence of unspecified artificial knee joint: Secondary | ICD-10-CM | POA: Diagnosis not present

## 2012-07-11 DIAGNOSIS — IMO0001 Reserved for inherently not codable concepts without codable children: Secondary | ICD-10-CM | POA: Diagnosis not present

## 2012-07-11 DIAGNOSIS — E119 Type 2 diabetes mellitus without complications: Secondary | ICD-10-CM | POA: Diagnosis not present

## 2012-07-11 DIAGNOSIS — Z471 Aftercare following joint replacement surgery: Secondary | ICD-10-CM | POA: Diagnosis not present

## 2012-07-12 DIAGNOSIS — IMO0001 Reserved for inherently not codable concepts without codable children: Secondary | ICD-10-CM | POA: Diagnosis not present

## 2012-07-12 DIAGNOSIS — Z471 Aftercare following joint replacement surgery: Secondary | ICD-10-CM | POA: Diagnosis not present

## 2012-07-12 DIAGNOSIS — Z96659 Presence of unspecified artificial knee joint: Secondary | ICD-10-CM | POA: Diagnosis not present

## 2012-07-12 DIAGNOSIS — E119 Type 2 diabetes mellitus without complications: Secondary | ICD-10-CM | POA: Diagnosis not present

## 2012-07-12 DIAGNOSIS — F329 Major depressive disorder, single episode, unspecified: Secondary | ICD-10-CM | POA: Diagnosis not present

## 2012-07-13 DIAGNOSIS — IMO0001 Reserved for inherently not codable concepts without codable children: Secondary | ICD-10-CM | POA: Diagnosis not present

## 2012-07-13 DIAGNOSIS — Z96659 Presence of unspecified artificial knee joint: Secondary | ICD-10-CM | POA: Diagnosis not present

## 2012-07-13 DIAGNOSIS — F329 Major depressive disorder, single episode, unspecified: Secondary | ICD-10-CM | POA: Diagnosis not present

## 2012-07-13 DIAGNOSIS — E119 Type 2 diabetes mellitus without complications: Secondary | ICD-10-CM | POA: Diagnosis not present

## 2012-07-13 DIAGNOSIS — Z471 Aftercare following joint replacement surgery: Secondary | ICD-10-CM | POA: Diagnosis not present

## 2012-07-19 DIAGNOSIS — Z96659 Presence of unspecified artificial knee joint: Secondary | ICD-10-CM | POA: Diagnosis not present

## 2012-07-19 DIAGNOSIS — M25569 Pain in unspecified knee: Secondary | ICD-10-CM | POA: Diagnosis not present

## 2012-07-19 DIAGNOSIS — M1710 Unilateral primary osteoarthritis, unspecified knee: Secondary | ICD-10-CM | POA: Diagnosis not present

## 2012-07-21 DIAGNOSIS — Z471 Aftercare following joint replacement surgery: Secondary | ICD-10-CM | POA: Diagnosis not present

## 2012-07-21 DIAGNOSIS — M1710 Unilateral primary osteoarthritis, unspecified knee: Secondary | ICD-10-CM | POA: Diagnosis not present

## 2012-07-21 DIAGNOSIS — M25569 Pain in unspecified knee: Secondary | ICD-10-CM | POA: Diagnosis not present

## 2012-07-26 DIAGNOSIS — M1710 Unilateral primary osteoarthritis, unspecified knee: Secondary | ICD-10-CM | POA: Diagnosis not present

## 2012-07-26 DIAGNOSIS — M25569 Pain in unspecified knee: Secondary | ICD-10-CM | POA: Diagnosis not present

## 2012-07-26 DIAGNOSIS — Z96659 Presence of unspecified artificial knee joint: Secondary | ICD-10-CM | POA: Diagnosis not present

## 2012-07-28 DIAGNOSIS — M1710 Unilateral primary osteoarthritis, unspecified knee: Secondary | ICD-10-CM | POA: Diagnosis not present

## 2012-07-28 DIAGNOSIS — M25569 Pain in unspecified knee: Secondary | ICD-10-CM | POA: Diagnosis not present

## 2012-07-28 DIAGNOSIS — Z471 Aftercare following joint replacement surgery: Secondary | ICD-10-CM | POA: Diagnosis not present

## 2012-08-01 DIAGNOSIS — M1710 Unilateral primary osteoarthritis, unspecified knee: Secondary | ICD-10-CM | POA: Diagnosis not present

## 2012-08-01 DIAGNOSIS — Z96659 Presence of unspecified artificial knee joint: Secondary | ICD-10-CM | POA: Diagnosis not present

## 2012-08-01 DIAGNOSIS — M25569 Pain in unspecified knee: Secondary | ICD-10-CM | POA: Diagnosis not present

## 2012-08-03 DIAGNOSIS — M1710 Unilateral primary osteoarthritis, unspecified knee: Secondary | ICD-10-CM | POA: Diagnosis not present

## 2012-08-03 DIAGNOSIS — Z96659 Presence of unspecified artificial knee joint: Secondary | ICD-10-CM | POA: Diagnosis not present

## 2012-08-03 DIAGNOSIS — M25569 Pain in unspecified knee: Secondary | ICD-10-CM | POA: Diagnosis not present

## 2012-08-07 DIAGNOSIS — Z96659 Presence of unspecified artificial knee joint: Secondary | ICD-10-CM | POA: Diagnosis not present

## 2012-08-07 DIAGNOSIS — M1710 Unilateral primary osteoarthritis, unspecified knee: Secondary | ICD-10-CM | POA: Diagnosis not present

## 2012-08-07 DIAGNOSIS — M25569 Pain in unspecified knee: Secondary | ICD-10-CM | POA: Diagnosis not present

## 2012-08-10 DIAGNOSIS — M25569 Pain in unspecified knee: Secondary | ICD-10-CM | POA: Diagnosis not present

## 2012-08-10 DIAGNOSIS — Z96659 Presence of unspecified artificial knee joint: Secondary | ICD-10-CM | POA: Diagnosis not present

## 2012-08-10 DIAGNOSIS — M1710 Unilateral primary osteoarthritis, unspecified knee: Secondary | ICD-10-CM | POA: Diagnosis not present

## 2012-08-15 DIAGNOSIS — M1710 Unilateral primary osteoarthritis, unspecified knee: Secondary | ICD-10-CM | POA: Diagnosis not present

## 2012-08-15 DIAGNOSIS — M25569 Pain in unspecified knee: Secondary | ICD-10-CM | POA: Diagnosis not present

## 2012-08-15 DIAGNOSIS — Z96659 Presence of unspecified artificial knee joint: Secondary | ICD-10-CM | POA: Diagnosis not present

## 2012-08-17 DIAGNOSIS — Z96659 Presence of unspecified artificial knee joint: Secondary | ICD-10-CM | POA: Diagnosis not present

## 2012-08-17 DIAGNOSIS — M1710 Unilateral primary osteoarthritis, unspecified knee: Secondary | ICD-10-CM | POA: Diagnosis not present

## 2012-08-17 DIAGNOSIS — M25569 Pain in unspecified knee: Secondary | ICD-10-CM | POA: Diagnosis not present

## 2012-08-20 LAB — HM DIABETES EYE EXAM

## 2012-08-23 DIAGNOSIS — M25569 Pain in unspecified knee: Secondary | ICD-10-CM | POA: Diagnosis not present

## 2012-08-23 DIAGNOSIS — M1710 Unilateral primary osteoarthritis, unspecified knee: Secondary | ICD-10-CM | POA: Diagnosis not present

## 2012-10-09 ENCOUNTER — Ambulatory Visit: Payer: Medicare Other | Admitting: Family Medicine

## 2012-12-13 ENCOUNTER — Ambulatory Visit: Payer: Medicare Other | Admitting: Family Medicine

## 2013-01-29 ENCOUNTER — Telehealth: Payer: Self-pay | Admitting: *Deleted

## 2013-01-29 NOTE — Telephone Encounter (Signed)
Patient called and stated that she was given samples of Januvia. She states that the medication works well for her and would a like a prescription sent to the pharmacy. Is it okay to fill? Please advise. SW

## 2013-01-29 NOTE — Telephone Encounter (Signed)
Ok for Januvia samples but pt is overdue for visit- needs DM follow up

## 2013-01-30 NOTE — Telephone Encounter (Signed)
Samples was placed up front for pick up. Patient states that she has an upcoming appointment in December. SW

## 2013-01-31 ENCOUNTER — Other Ambulatory Visit: Payer: Self-pay | Admitting: Family Medicine

## 2013-02-01 NOTE — Telephone Encounter (Signed)
Med filled.  

## 2013-02-21 ENCOUNTER — Encounter: Payer: Self-pay | Admitting: Family Medicine

## 2013-02-21 ENCOUNTER — Ambulatory Visit (INDEPENDENT_AMBULATORY_CARE_PROVIDER_SITE_OTHER): Payer: Medicare Other | Admitting: Family Medicine

## 2013-02-21 VITALS — BP 140/82 | HR 80 | Temp 98.6°F | Resp 16 | Wt 176.2 lb

## 2013-02-21 DIAGNOSIS — I10 Essential (primary) hypertension: Secondary | ICD-10-CM | POA: Diagnosis not present

## 2013-02-21 DIAGNOSIS — E785 Hyperlipidemia, unspecified: Secondary | ICD-10-CM | POA: Diagnosis not present

## 2013-02-21 LAB — HEPATIC FUNCTION PANEL
Albumin: 3.9 g/dL (ref 3.5–5.2)
Alkaline Phosphatase: 81 U/L (ref 39–117)
Bilirubin, Direct: 0 mg/dL (ref 0.0–0.3)
Total Protein: 7 g/dL (ref 6.0–8.3)

## 2013-02-21 LAB — HEMOGLOBIN A1C: Hgb A1c MFr Bld: 6.8 % — ABNORMAL HIGH (ref 4.6–6.5)

## 2013-02-21 LAB — LIPID PANEL
Cholesterol: 177 mg/dL (ref 0–200)
HDL: 34 mg/dL — ABNORMAL LOW (ref >39.00)
VLDL: 33.6 mg/dL (ref 0.0–40.0)

## 2013-02-21 LAB — CBC WITH DIFFERENTIAL/PLATELET
Basophils Absolute: 0 10*3/uL (ref 0.0–0.1)
HCT: 36.2 % (ref 36.0–46.0)
Hemoglobin: 12.5 g/dL (ref 12.0–15.0)
Lymphs Abs: 1.2 10*3/uL (ref 0.7–4.0)
MCHC: 34.4 g/dL (ref 30.0–36.0)
MCV: 83.4 fl (ref 78.0–100.0)
Monocytes Absolute: 0.5 10*3/uL (ref 0.1–1.0)
Monocytes Relative: 6.1 % (ref 3.0–12.0)
Neutro Abs: 6.8 10*3/uL (ref 1.4–7.7)
Platelets: 254 10*3/uL (ref 150.0–400.0)
RDW: 13.8 % (ref 11.5–14.6)

## 2013-02-21 LAB — BASIC METABOLIC PANEL
CO2: 31 mEq/L (ref 19–32)
Calcium: 9.4 mg/dL (ref 8.4–10.5)
Creatinine, Ser: 0.8 mg/dL (ref 0.4–1.2)
GFR: 74.91 mL/min (ref >60.00)
Sodium: 139 mEq/L (ref 135–145)

## 2013-02-21 LAB — TSH: TSH: 1.26 u[IU]/mL (ref 0.35–5.50)

## 2013-02-21 MED ORDER — LISINOPRIL 10 MG PO TABS: 10.0000 mg | ORAL_TABLET | Freq: Every day | ORAL | Status: DC

## 2013-02-21 MED ORDER — MECLIZINE HCL 50 MG PO TABS: 50.0000 mg | ORAL_TABLET | Freq: Three times a day (TID) | ORAL | Status: DC | PRN

## 2013-02-21 NOTE — Assessment & Plan Note (Signed)
Chronic problem.  Tolerating statin w/out difficulty.  Check labs.  Adjust meds prn  

## 2013-02-21 NOTE — Patient Instructions (Signed)
Follow up in 3-4 months to recheck diabetes We'll notify you of your lab results and make any changes if needed Start Lisinopril daily for BP and kidney protection Take the Meclizine as needed for vertigo Call with any questions or concerns Hang in there! Happy Holidays!!

## 2013-02-21 NOTE — Assessment & Plan Note (Signed)
Pt has had elevated BP previously but not dx of HTN.  Will start pt on ACE for both BP control and renal protection.  Check labs.  Will follow closely.

## 2013-02-21 NOTE — Assessment & Plan Note (Signed)
Pt has not been compliant w/ f/u.  Taking meds as directed.  UTD on eye exam.  Needs to start ACE for renal protection.  Check labs.  Adjust meds prn

## 2013-02-21 NOTE — Progress Notes (Signed)
   Subjective:    Patient ID: Tracy Williams, female    DOB: [birth year], 67 y.o.   MRN: [id number]  HPI Pre visit review using our clinic review tool, if applicable. No additional management support is needed unless otherwise documented below in the visit note.  DM- chronic problem, not compliant.  Intermittently checking CBGs, 'the lowest it's been is the 140s'.  Was unable to tolerate Metformin.  On Januvia.  Denies symptomatic lows.  UTD on eye exam.  HTN- chronic problem, 'it's usually in the 140's'.  Not currently on meds.  No CP, SOB, HAs, visual changes, edema.  Hyperlipidemia- chronic problem, on Lipitor.  Denies abd pain, N/V.  Vertigo- chronic problem, worse w/ turning head.  No nausea.   Review of Systems For ROS see HPI     Objective:   Physical Exam  Vitals reviewed. Constitutional: She is oriented to person, place, and time. She appears well-developed and well-nourished. No distress.  HENT:  Head: Normocephalic and atraumatic.  Eyes: Conjunctivae and EOM are normal. Pupils are equal, round, and reactive to light.  Neck: Normal range of motion. Neck supple. No thyromegaly present.  Cardiovascular: Normal rate, regular rhythm, normal heart sounds and intact distal pulses.   No murmur heard. Pulmonary/Chest: Effort normal and breath sounds normal. No respiratory distress.  Abdominal: Soft. She exhibits no distension. There is no tenderness.  Musculoskeletal: She exhibits no edema.  Lymphadenopathy:    She has no cervical adenopathy.  Neurological: She is alert and oriented to person, place, and time.  Skin: Skin is warm and dry.  Psychiatric: She has a normal mood and affect. Her behavior is normal.          Assessment & Plan:

## 2013-02-22 ENCOUNTER — Encounter: Payer: Self-pay | Admitting: General Practice

## 2013-04-02 ENCOUNTER — Telehealth: Payer: Self-pay | Admitting: *Deleted

## 2013-04-02 NOTE — Telephone Encounter (Signed)
Patient called and state d that she was given [place] samples. Patient states that she is currently out. Patient would like to know if she could get more samples or if we don't have them get a Rx called into the [facility] on [address] Please advise. SW

## 2013-04-02 NOTE — Telephone Encounter (Signed)
Adrian for samples and please also send script to pharmacy so pt will not run out of meds, #30, 6 refills (Januvia 100mg )

## 2013-04-03 MED ORDER — SITAGLIPTIN PHOSPHATE 100 MG PO TABS: 100.0000 mg | ORAL_TABLET | Freq: Every day | ORAL | Status: DC

## 2013-04-03 NOTE — Telephone Encounter (Signed)
Med filled, samples given, pt notified.

## 2013-04-09 ENCOUNTER — Other Ambulatory Visit: Payer: Self-pay

## 2013-04-09 DIAGNOSIS — Z1231 Encounter for screening mammogram for malignant neoplasm of breast: Secondary | ICD-10-CM

## 2013-04-25 ENCOUNTER — Ambulatory Visit
Admission: RE | Admit: 2013-04-25 | Discharge: 2013-04-25 | Disposition: A | Discharge status: Home / Self Care | Payer: Medicare Other | Source: Ambulatory Visit

## 2013-04-25 DIAGNOSIS — Z1231 Encounter for screening mammogram for malignant neoplasm of breast: Secondary | ICD-10-CM | POA: Diagnosis not present

## 2013-04-29 LAB — HM MAMMOGRAPHY: HM MAMMO: NORMAL

## 2013-06-27 ENCOUNTER — Encounter: Payer: Self-pay | Admitting: General Practice

## 2013-06-27 ENCOUNTER — Ambulatory Visit (INDEPENDENT_AMBULATORY_CARE_PROVIDER_SITE_OTHER): Payer: Medicare Other | Admitting: Family Medicine

## 2013-06-27 ENCOUNTER — Encounter: Payer: Self-pay | Admitting: Family Medicine

## 2013-06-27 VITALS — BP 118/80 | HR 78 | Temp 98.0°F | Resp 16 | Wt 178.5 lb

## 2013-06-27 DIAGNOSIS — IMO0001 Reserved for inherently not codable concepts without codable children: Secondary | ICD-10-CM

## 2013-06-27 DIAGNOSIS — E1165 Type 2 diabetes mellitus with hyperglycemia: Principal | ICD-10-CM

## 2013-06-27 LAB — BASIC METABOLIC PANEL
BUN: 19 mg/dL (ref 6–23)
CO2: 27 meq/L (ref 19–32)
Calcium: 9.1 mg/dL (ref 8.4–10.5)
Chloride: 104 mEq/L (ref 96–112)
Creatinine, Ser: 0.8 mg/dL (ref 0.4–1.2)
GFR: 72.76 mL/min (ref >60.00)
Glucose, Bld: 157 mg/dL — ABNORMAL HIGH (ref 70–99)
POTASSIUM: 4.2 meq/L (ref 3.5–5.1)
SODIUM: 139 meq/L (ref 135–145)

## 2013-06-27 LAB — HEMOGLOBIN A1C: HEMOGLOBIN A1C: 7.1 % — AB (ref 4.6–6.5)

## 2013-06-27 NOTE — Progress Notes (Signed)
   Subjective:    Patient ID: Tracy Williams, female    DOB: 06-Sep-1945, 68 y.o.   MRN: [id number]  HPI DM- chronic problem, on Januvia.  ACE for renal protection.  Reports feeling well.  No symptomatic lows.  No CP, SOB, HAs, visual changes, edema.  Due for eye exam.  No numbness/tingling hands/feet.   Review of Systems For ROS see HPI     Objective:   Physical Exam  Vitals reviewed. Constitutional: She is oriented to person, place, and time. She appears well-developed and well-nourished. No distress.  HENT:  Head: Normocephalic and atraumatic.  Eyes: Conjunctivae and EOM are normal. Pupils are equal, round, and reactive to light.  Neck: Normal range of motion. Neck supple. No thyromegaly present.  Cardiovascular: Normal rate, regular rhythm, normal heart sounds and intact distal pulses.   No murmur heard. Pulmonary/Chest: Effort normal and breath sounds normal. No respiratory distress.  Abdominal: Soft. She exhibits no distension. There is no tenderness.  Musculoskeletal: She exhibits no edema.  Lymphadenopathy:    She has no cervical adenopathy.  Neurological: She is alert and oriented to person, place, and time.  Skin: Skin is warm and dry.  Psychiatric: She has a normal mood and affect. Her behavior is normal.          Assessment & Plan:

## 2013-06-27 NOTE — Progress Notes (Signed)
Pre visit review using our clinic review tool, if applicable. No additional management support is needed unless otherwise documented below in the visit note. 

## 2013-06-27 NOTE — Assessment & Plan Note (Signed)
Chronic problem, tolerating Januvia w/o difficulty.  Overdue for eye exam- encouraged her to schedule.  On ACE for renal protection and BP control.  Check labs.  Adjust meds prn

## 2013-06-27 NOTE — Patient Instructions (Signed)
Schedule your complete physical in 3-4 months We'll notify you of your lab results and make any changes if needed Call and schedule an eye exam at your convenience Call with any questions or concerns Happy Spring!!

## 2013-07-10 ENCOUNTER — Telehealth: Payer: Self-pay

## 2013-07-10 NOTE — Telephone Encounter (Signed)
Relevant patient education assigned to patient using Emmi. ° °

## 2013-07-13 ENCOUNTER — Other Ambulatory Visit: Payer: Self-pay | Admitting: Family Medicine

## 2013-07-13 NOTE — Telephone Encounter (Signed)
Med filled.  

## 2013-08-24 ENCOUNTER — Telehealth: Payer: Self-pay | Admitting: Family Medicine

## 2013-08-24 NOTE — Telephone Encounter (Signed)
Caller name:Tracy Williams Relation to pt: patient  Call back number: [phone] Pharmacy:  Reason for call:  Patient called and stated that Janumet is too high for her. Patient would like to try an alternative. Also patient states that she knows metformin makes her sick. Please advise

## 2013-08-24 NOTE — Telephone Encounter (Signed)
Is Onglyza on pt's formulary?  If not, Amaryl 2mg  daily

## 2013-08-24 NOTE — Telephone Encounter (Signed)
Called pt and lmovm to contact insurance formulary to find out if there is a comparable medication.

## 2013-09-27 ENCOUNTER — Other Ambulatory Visit: Payer: Self-pay | Admitting: Family Medicine

## 2013-09-27 NOTE — Telephone Encounter (Signed)
Med filled.  

## 2013-10-31 ENCOUNTER — Encounter: Payer: Self-pay | Admitting: Family Medicine

## 2013-10-31 ENCOUNTER — Other Ambulatory Visit: Payer: Self-pay | Admitting: General Practice

## 2013-10-31 ENCOUNTER — Ambulatory Visit (INDEPENDENT_AMBULATORY_CARE_PROVIDER_SITE_OTHER): Payer: Medicare Other | Admitting: Family Medicine

## 2013-10-31 VITALS — BP 112/78 | HR 80 | Temp 98.2°F | Resp 16 | Ht 65.5 in | Wt 181.2 lb

## 2013-10-31 DIAGNOSIS — E785 Hyperlipidemia, unspecified: Secondary | ICD-10-CM | POA: Diagnosis not present

## 2013-10-31 DIAGNOSIS — IMO0001 Reserved for inherently not codable concepts without codable children: Secondary | ICD-10-CM

## 2013-10-31 DIAGNOSIS — F32A Depression, unspecified: Secondary | ICD-10-CM

## 2013-10-31 DIAGNOSIS — E1165 Type 2 diabetes mellitus with hyperglycemia: Secondary | ICD-10-CM

## 2013-10-31 DIAGNOSIS — Z0000 Encounter for general adult medical examination without abnormal findings: Secondary | ICD-10-CM

## 2013-10-31 DIAGNOSIS — M810 Age-related osteoporosis without current pathological fracture: Secondary | ICD-10-CM

## 2013-10-31 DIAGNOSIS — F3289 Other specified depressive episodes: Secondary | ICD-10-CM

## 2013-10-31 DIAGNOSIS — F329 Major depressive disorder, single episode, unspecified: Secondary | ICD-10-CM | POA: Diagnosis not present

## 2013-10-31 LAB — LIPID PANEL
CHOLESTEROL: 180 mg/dL (ref 0–200)
HDL: 32.6 mg/dL — AB (ref >39.00)
LDL Cholesterol: 117 mg/dL — ABNORMAL HIGH (ref 0–99)
NonHDL: 147.4
TRIGLYCERIDES: 153 mg/dL — AB (ref 0.0–149.0)
Total CHOL/HDL Ratio: 6
VLDL: 30.6 mg/dL (ref 0.0–40.0)

## 2013-10-31 LAB — BASIC METABOLIC PANEL
BUN: 16 mg/dL (ref 6–23)
CALCIUM: 9 mg/dL (ref 8.4–10.5)
CO2: 28 meq/L (ref 19–32)
Chloride: 103 mEq/L (ref 96–112)
Creatinine, Ser: 0.9 mg/dL (ref 0.4–1.2)
GFR: 67.06 mL/min (ref >60.00)
GLUCOSE: 159 mg/dL — AB (ref 70–99)
Potassium: 4.4 mEq/L (ref 3.5–5.1)
Sodium: 137 mEq/L (ref 135–145)

## 2013-10-31 LAB — HEPATIC FUNCTION PANEL
ALT: 12 U/L (ref 0–35)
AST: 14 U/L (ref 0–37)
Albumin: 3.7 g/dL (ref 3.5–5.2)
Alkaline Phosphatase: 79 U/L (ref 39–117)
BILIRUBIN DIRECT: 0 mg/dL (ref 0.0–0.3)
TOTAL PROTEIN: 6.4 g/dL (ref 6.0–8.3)
Total Bilirubin: 0.8 mg/dL (ref 0.2–1.2)

## 2013-10-31 LAB — HEMOGLOBIN A1C: HEMOGLOBIN A1C: 7.4 % — AB (ref 4.6–6.5)

## 2013-10-31 LAB — TSH: TSH: 0.77 u[IU]/mL (ref 0.35–4.50)

## 2013-10-31 LAB — VITAMIN D 25 HYDROXY (VIT D DEFICIENCY, FRACTURES): VITD: 22.84 ng/mL — ABNORMAL LOW (ref 30.00–100.00)

## 2013-10-31 MED ORDER — VITAMIN D (ERGOCALCIFEROL) 1.25 MG (50000 UNIT) PO CAPS: 50000.0000 [IU] | ORAL_CAPSULE | ORAL | Status: DC

## 2013-10-31 NOTE — Patient Instructions (Signed)
Follow up in 3-4 months to recheck Diabetes We'll notify you of your lab results and make any changes if needed Start a daily 81mg  aspirin Add Coenzyme Q10 daily to improve the muscles aches We'll call you with your bone density appt Keep up the good work!  You look great! Enjoy the rest of your summer!!!

## 2013-10-31 NOTE — Progress Notes (Signed)
   Subjective:    Patient ID: Tracy Williams, female    DOB: Sep 06, 1945, 67 y.o.   MRN: [id number]  HPI Here today for CPE.  Risk Factors: DM- chronic problem, on Januvia.  On ACE for renal protection.  Due for eye exam.  Intermittently checking sugars, last CBG 150.  Denies symptomatic lows.  No numbness/tingling hands/feet.  No N/V/D Hyperlipidemia- chronic problem, on Lipitor.  Denies abd pain, N/V, mylagias Physical Activity: no regular exercise Fall Risk: low risk Depression: ongoing problem, pt denies current sxs Hearing: normal to conversational tones, decreased to whispered voice ADL's: independent Cognitive: normal linear thought process Home Safety: safe at home, lives w/ disabled husband Height, Weight, BMI, Visual Acuity: see vitals, vision corrected to 20/20 w/ glasses Counseling: UTD on colonoscopy, UTD on mammo.  Due for DEXA this month at [facility].  No longer having paps. Labs Ordered: See A&P Care Plan: See A&P    Review of Systems Patient reports no vision/ hearing changes, adenopathy,fever, weight change,  persistant/recurrent hoarseness , swallowing issues, chest pain, palpitations, edema, persistant/recurrent cough, hemoptysis, dyspnea (rest/exertional/paroxysmal nocturnal), gastrointestinal bleeding (melena, rectal bleeding), abdominal pain, significant heartburn, bowel changes, GU symptoms (dysuria, hematuria, incontinence), Gyn symptoms (abnormal  bleeding, pain),  syncope, focal weakness, memory loss, numbness & tingling, skin/hair/nail changes, abnormal bruising or bleeding, anxiety, or depression.     Objective:   Physical Exam General Appearance:    Alert, cooperative, no distress, appears stated age  Head:    Normocephalic, without obvious abnormality, atraumatic  Eyes:    PERRL, conjunctiva/corneas clear, EOM's intact, fundi    benign, both eyes  Ears:    Normal TM's and external ear canals, both ears  Nose:   Nares normal, septum midline,  mucosa normal, no drainage    or sinus tenderness  Throat:   Lips, mucosa, and tongue normal; teeth and gums normal  Neck:   Supple, symmetrical, trachea midline, no adenopathy;    Thyroid: no enlargement/tenderness/nodules  Back:     Symmetric, no curvature, ROM normal, no CVA tenderness  Lungs:     Clear to auscultation bilaterally, respirations unlabored  Chest Wall:    No tenderness or deformity   Heart:    Regular rate and rhythm, S1 and S2 normal, no murmur, rub   or gallop  Breast Exam:    Deferred to mammo  Abdomen:     Soft, non-tender, bowel sounds active all four quadrants,    no masses, no organomegaly  Genitalia:    Deferred at pt's request  Rectal:    Extremities:   Extremities normal, atraumatic, no cyanosis or edema  Pulses:   2+ and symmetric all extremities  Skin:   Skin color, texture, turgor normal, no rashes or lesions  Lymph nodes:   Cervical, supraclavicular, and axillary nodes normal  Neurologic:   CNII-XII intact, normal strength, sensation and reflexes    throughout          Assessment & Plan:

## 2013-10-31 NOTE — Progress Notes (Signed)
Pre visit review using our clinic review tool, if applicable. No additional management support is needed unless otherwise documented below in the visit note. 

## 2013-11-05 NOTE — Assessment & Plan Note (Signed)
Chronic problem.  Due for DEXA.  Check Vit D.  Replete prn.

## 2013-11-05 NOTE — Assessment & Plan Note (Signed)
Pt's PE WNL for age.  UTD on colonoscopy, mammo.  Due for DEXA this month.  No need for paps.  Written screening schedule updated and given to pt.  Check labs.  Anticipatory guidance provided.

## 2013-11-05 NOTE — Assessment & Plan Note (Signed)
Chronic problem.  Tolerating statin w/o difficulty.  Check labs.  Adjust meds prn  

## 2013-11-05 NOTE — Assessment & Plan Note (Signed)
Chronic problem.  Pt reports CBGs are well controlled.  Asymptomatic.  On ACE for renal protection.  Due for eye exam- pt plans to schedule.  Check labs.  Adjust meds prn

## 2013-11-05 NOTE — Assessment & Plan Note (Signed)
Chronic problem.  Pt reports she is currently asymptomatic b/c husband's health issues have temporarily stabilized.  Will continue to follow.

## 2013-11-19 ENCOUNTER — Telehealth: Payer: Self-pay

## 2013-11-19 NOTE — Telephone Encounter (Signed)
Enrollment form complete[place]  [place] [id number].  Copy of form made and labeled.  Placed up front for scanning.

## 2013-11-27 ENCOUNTER — Telehealth: Payer: Self-pay | Admitting: Family Medicine

## 2013-11-27 NOTE — Telephone Encounter (Signed)
It looks like forms were mailed off on 8/31; pt did not want this.  Has more items to send with form.   Pt called back to give #  [phone]

## 2013-11-27 NOTE — Telephone Encounter (Signed)
Pt states she dropped off paperwork 8/24, regarding pt assistance program, pt would like to know if it is available for pick.

## 2013-11-29 NOTE — Telephone Encounter (Signed)
Spoke with patient who states that sending off paperwork was fine.  She stated that she only wanted to make sure her part was filled out correctly.  No further needs voiced.

## 2013-11-29 NOTE — Telephone Encounter (Signed)
Called and left a message for call back  

## 2014-01-18 ENCOUNTER — Other Ambulatory Visit: Payer: Self-pay | Admitting: General Practice

## 2014-01-18 MED ORDER — LISINOPRIL 10 MG PO TABS: ORAL_TABLET | ORAL | Status: DC

## 2014-01-31 ENCOUNTER — Other Ambulatory Visit: Payer: Self-pay | Admitting: General Practice

## 2014-01-31 MED ORDER — ATORVASTATIN CALCIUM 20 MG PO TABS
ORAL_TABLET | ORAL | Status: DC
Start: 2014-01-31 — End: 2014-10-03

## 2014-03-06 ENCOUNTER — Ambulatory Visit: Payer: Medicare Other | Admitting: Family Medicine

## 2014-03-26 ENCOUNTER — Other Ambulatory Visit: Payer: Self-pay

## 2014-03-26 DIAGNOSIS — Z1231 Encounter for screening mammogram for malignant neoplasm of breast: Secondary | ICD-10-CM

## 2014-03-27 ENCOUNTER — Ambulatory Visit: Payer: Medicare Other | Admitting: Family Medicine

## 2014-04-04 DIAGNOSIS — E119 Type 2 diabetes mellitus without complications: Secondary | ICD-10-CM | POA: Diagnosis not present

## 2014-04-04 DIAGNOSIS — H2513 Age-related nuclear cataract, bilateral: Secondary | ICD-10-CM | POA: Diagnosis not present

## 2014-04-04 DIAGNOSIS — H524 Presbyopia: Secondary | ICD-10-CM | POA: Diagnosis not present

## 2014-04-04 DIAGNOSIS — H5203 Hypermetropia, bilateral: Secondary | ICD-10-CM | POA: Diagnosis not present

## 2014-04-04 LAB — HM DIABETES EYE EXAM

## 2014-04-16 ENCOUNTER — Encounter: Payer: Self-pay | Admitting: General Practice

## 2014-05-01 ENCOUNTER — Ambulatory Visit
Admission: RE | Admit: 2014-05-01 | Discharge: 2014-05-01 | Disposition: A | Discharge status: Home / Self Care | Payer: Medicare Other | Source: Ambulatory Visit | Attending: Family Medicine | Admitting: Family Medicine

## 2014-05-01 ENCOUNTER — Ambulatory Visit
Admission: RE | Admit: 2014-05-01 | Discharge: 2014-05-01 | Disposition: A | Discharge status: Home / Self Care | Payer: Medicare Other | Source: Ambulatory Visit

## 2014-05-01 DIAGNOSIS — M810 Age-related osteoporosis without current pathological fracture: Secondary | ICD-10-CM

## 2014-05-01 DIAGNOSIS — Z1231 Encounter for screening mammogram for malignant neoplasm of breast: Secondary | ICD-10-CM | POA: Diagnosis not present

## 2014-05-01 DIAGNOSIS — M899 Disorder of bone, unspecified: Secondary | ICD-10-CM | POA: Diagnosis not present

## 2014-05-08 ENCOUNTER — Encounter: Payer: Self-pay | Admitting: Family Medicine

## 2014-05-08 ENCOUNTER — Ambulatory Visit (INDEPENDENT_AMBULATORY_CARE_PROVIDER_SITE_OTHER): Payer: Medicare Other | Admitting: Family Medicine

## 2014-05-08 VITALS — BP 116/80 | HR 78 | Temp 98.0°F | Resp 16 | Wt 179.0 lb

## 2014-05-08 DIAGNOSIS — Z23 Encounter for immunization: Secondary | ICD-10-CM

## 2014-05-08 DIAGNOSIS — E119 Type 2 diabetes mellitus without complications: Secondary | ICD-10-CM | POA: Diagnosis not present

## 2014-05-08 DIAGNOSIS — E785 Hyperlipidemia, unspecified: Secondary | ICD-10-CM | POA: Diagnosis not present

## 2014-05-08 DIAGNOSIS — E1169 Type 2 diabetes mellitus with other specified complication: Secondary | ICD-10-CM | POA: Diagnosis not present

## 2014-05-08 DIAGNOSIS — I10 Essential (primary) hypertension: Secondary | ICD-10-CM

## 2014-05-08 LAB — LIPID PANEL
Cholesterol: 196 mg/dL (ref 0–200)
HDL: 34.6 mg/dL — ABNORMAL LOW (ref >39.00)
LDL Cholesterol: 127 mg/dL — ABNORMAL HIGH (ref 0–99)
NonHDL: 161.4
Total CHOL/HDL Ratio: 6
Triglycerides: 174 mg/dL — ABNORMAL HIGH (ref 0.0–149.0)
VLDL: 34.8 mg/dL (ref 0.0–40.0)

## 2014-05-08 LAB — CBC WITH DIFFERENTIAL/PLATELET
BASOS PCT: 0.4 % (ref 0.0–3.0)
Basophils Absolute: 0 10*3/uL (ref 0.0–0.1)
Eosinophils Absolute: 0.2 10*3/uL (ref 0.0–0.7)
Eosinophils Relative: 2.9 % (ref 0.0–5.0)
HEMATOCRIT: 35.8 % — AB (ref 36.0–46.0)
HEMOGLOBIN: 12.2 g/dL (ref 12.0–15.0)
LYMPHS ABS: 1.2 10*3/uL (ref 0.7–4.0)
Lymphocytes Relative: 14.4 % (ref 12.0–46.0)
MCHC: 34.1 g/dL (ref 30.0–36.0)
MCV: 84 fl (ref 78.0–100.0)
Monocytes Absolute: 0.6 10*3/uL (ref 0.1–1.0)
Monocytes Relative: 6.8 % (ref 3.0–12.0)
Neutro Abs: 6.4 10*3/uL (ref 1.4–7.7)
Neutrophils Relative %: 75.5 % (ref 43.0–77.0)
Platelets: 252 10*3/uL (ref 150.0–400.0)
RBC: 4.26 Mil/uL (ref 3.87–5.11)
RDW: 13.5 % (ref 11.5–15.5)
WBC: 8.5 10*3/uL (ref 4.0–10.5)

## 2014-05-08 LAB — BASIC METABOLIC PANEL
BUN: 18 mg/dL (ref 6–23)
CO2: 27 mEq/L (ref 19–32)
Calcium: 9.3 mg/dL (ref 8.4–10.5)
Chloride: 104 mEq/L (ref 96–112)
Creatinine, Ser: 0.88 mg/dL (ref 0.40–1.20)
GFR: 67.83 mL/min (ref >60.00)
Glucose, Bld: 172 mg/dL — ABNORMAL HIGH (ref 70–99)
POTASSIUM: 4.3 meq/L (ref 3.5–5.1)
Sodium: 138 mEq/L (ref 135–145)

## 2014-05-08 LAB — HEPATIC FUNCTION PANEL
ALK PHOS: 96 U/L (ref 39–117)
ALT: 11 U/L (ref 0–35)
AST: 11 U/L (ref 0–37)
Albumin: 3.9 g/dL (ref 3.5–5.2)
Bilirubin, Direct: 0.1 mg/dL (ref 0.0–0.3)
TOTAL PROTEIN: 6.9 g/dL (ref 6.0–8.3)
Total Bilirubin: 0.7 mg/dL (ref 0.2–1.2)

## 2014-05-08 LAB — TSH: TSH: 1.86 u[IU]/mL (ref 0.35–4.50)

## 2014-05-08 LAB — HEMOGLOBIN A1C: Hgb A1c MFr Bld: 7.7 % — ABNORMAL HIGH (ref 4.6–6.5)

## 2014-05-08 NOTE — Patient Instructions (Signed)
Follow up in 3-4 months to recheck diabetes We'll notify you of your lab results and make any changes if needed Keep up the good work!  You look great!!! Call with any questions or concerns [place]!! (fingers crossed!)

## 2014-05-08 NOTE — Progress Notes (Signed)
   Subjective:    Patient ID: Tracy Williams, female    DOB: 1946/03/14, 69 y.o.   MRN: [id number]  HPI DM- chronic problem, on Januvia.  On ACE for renal protection.  Not checking CBGs at home.  Denies symptomatic lows.  UTD on eye exam.  No numbness/tingling of hands/feet.  HTN- chronic problem, on Lisinopril.  No CP, SOB, HAs, visual changes, edema.  Hyperlipidemia- chronic problem, on Lipitor.  No abd pain, N/V/D, myalgias.  Review of Systems For ROS see HPI   Reviewed meds, allergies, problem list, and PMH in chart     Objective:   Physical Exam  Constitutional: She is oriented to person, place, and time. She appears well-developed and well-nourished. No distress.  HENT:  Head: Normocephalic and atraumatic.  Eyes: Conjunctivae and EOM are normal. Pupils are equal, round, and reactive to light.  Neck: Normal range of motion. Neck supple. No thyromegaly present.  Cardiovascular: Normal rate, regular rhythm, normal heart sounds and intact distal pulses.   No murmur heard. Pulmonary/Chest: Effort normal and breath sounds normal. No respiratory distress.  Abdominal: Soft. She exhibits no distension. There is no tenderness.  Musculoskeletal: She exhibits no edema.  Lymphadenopathy:    She has no cervical adenopathy.  Neurological: She is alert and oriented to person, place, and time.  Skin: Skin is warm and dry.  Psychiatric: She has a normal mood and affect. Her behavior is normal.  Vitals reviewed.         Assessment & Plan:

## 2014-05-08 NOTE — Assessment & Plan Note (Signed)
Chronic problem.  Well controlled on ACE.  Asymptomatic.  Check labs.  No anticipated med changes.

## 2014-05-08 NOTE — Assessment & Plan Note (Signed)
Chronic problem.  Tolerating statin w/o difficulty.  Check labs.  Adjust meds prn  

## 2014-05-08 NOTE — Assessment & Plan Note (Signed)
Chronic problem.  UTD on eye exam.  On ACE for renal protection.  Currently asymptomatic.  Check labs.  Adjust meds prn

## 2014-05-08 NOTE — Progress Notes (Signed)
Pre visit review using our clinic review tool, if applicable. No additional management support is needed unless otherwise documented below in the visit note. 

## 2014-06-10 ENCOUNTER — Other Ambulatory Visit: Payer: Self-pay | Admitting: Family Medicine

## 2014-06-10 NOTE — Telephone Encounter (Signed)
Med filled.  

## 2014-06-13 ENCOUNTER — Telehealth: Payer: Self-pay | Admitting: *Deleted

## 2014-06-13 NOTE — Telephone Encounter (Signed)
Pt dropped off pt assistance form for Januvia. Form filled out and forwarded to Dr. Birdie Riddle. JG//CMA

## 2014-06-19 NOTE — Telephone Encounter (Signed)
Completed/signed forms received from Dr. Birdie Riddle. Mailed forms in the attached envelope.  Copy sent for scanning. JG//CMA

## 2014-09-06 ENCOUNTER — Ambulatory Visit: Payer: Medicare Other | Admitting: Family Medicine

## 2014-09-16 ENCOUNTER — Other Ambulatory Visit: Payer: Self-pay

## 2014-09-16 ENCOUNTER — Ambulatory Visit: Payer: Medicare Other | Admitting: Family Medicine

## 2014-10-03 ENCOUNTER — Ambulatory Visit (INDEPENDENT_AMBULATORY_CARE_PROVIDER_SITE_OTHER): Payer: Medicare Other | Admitting: Family Medicine

## 2014-10-03 ENCOUNTER — Encounter: Payer: Self-pay | Admitting: Family Medicine

## 2014-10-03 VITALS — BP 116/80 | HR 79 | Temp 98.4°F | Resp 16 | Ht 66.0 in | Wt 175.2 lb

## 2014-10-03 DIAGNOSIS — M545 Low back pain, unspecified: Secondary | ICD-10-CM | POA: Insufficient documentation

## 2014-10-03 DIAGNOSIS — E119 Type 2 diabetes mellitus without complications: Secondary | ICD-10-CM

## 2014-10-03 LAB — POCT URINALYSIS DIPSTICK
Bilirubin, UA: NEGATIVE
Blood, UA: NEGATIVE
Glucose, UA: NEGATIVE
KETONES UA: NEGATIVE
LEUKOCYTES UA: NEGATIVE
NITRITE UA: NEGATIVE
PH UA: 5.5
PROTEIN UA: NEGATIVE
Urobilinogen, UA: 2

## 2014-10-03 LAB — BASIC METABOLIC PANEL
BUN: 24 mg/dL — AB (ref 6–23)
CO2: 27 meq/L (ref 19–32)
Calcium: 9.3 mg/dL (ref 8.4–10.5)
Chloride: 106 mEq/L (ref 96–112)
Creatinine, Ser: 1.02 mg/dL (ref 0.40–1.20)
GFR: 57.14 mL/min — ABNORMAL LOW (ref >60.00)
GLUCOSE: 163 mg/dL — AB (ref 70–99)
POTASSIUM: 4.5 meq/L (ref 3.5–5.1)
Sodium: 139 mEq/L (ref 135–145)

## 2014-10-03 LAB — HEMOGLOBIN A1C: HEMOGLOBIN A1C: 7.7 % — AB (ref 4.6–6.5)

## 2014-10-03 MED ORDER — ATORVASTATIN CALCIUM 20 MG PO TABS
20.0000 mg | ORAL_TABLET | Freq: Every day | ORAL | Status: DC
Start: 2014-10-03 — End: 2015-04-20

## 2014-10-03 MED ORDER — LISINOPRIL 10 MG PO TABS: ORAL_TABLET | ORAL | Status: DC

## 2014-10-03 NOTE — Progress Notes (Signed)
Pre visit review using our clinic review tool, if applicable. No additional management support is needed unless otherwise documented below in the visit note. 

## 2014-10-03 NOTE — Progress Notes (Signed)
   Subjective:    Patient ID: Tracy Williams, female    DOB: 04/01/1945, 69 y.o.   MRN: [id number]  HPI DM- chronic problem, on Januvia.  UTD on eye and foot exam.  On ACE for renal protection.  Pt has lost 4-5 lbs since last visit.  No CP, SOB, HAs, visual changes, edema, symptomatic lows.  No numbness/tingling of hands/feet.  Bilateral back/flank pain- sxs started 2 weeks ago when pt was bent over cleaning lawn mower blades.  Resolved.  2 days ago pain returned when doing yard work and [employer].  Pt has hx of similar intermittent pain dating back to college.   Review of Systems For ROS see HPI     Objective:   Physical Exam  Constitutional: She is oriented to person, place, and time. She appears well-developed and well-nourished. No distress.  HENT:  Head: Normocephalic and atraumatic.  Eyes: Conjunctivae and EOM are normal. Pupils are equal, round, and reactive to light.  Neck: Normal range of motion. Neck supple. No thyromegaly present.  Cardiovascular: Normal rate, regular rhythm, normal heart sounds and intact distal pulses.   No murmur heard. Pulmonary/Chest: Effort normal and breath sounds normal. No respiratory distress.  Abdominal: Soft. She exhibits no distension. There is no tenderness.  Musculoskeletal: She exhibits tenderness. She exhibits no edema (TTP over bilateral lumbar paraspinal muscles).  Lymphadenopathy:    She has no cervical adenopathy.  Neurological: She is alert and oriented to person, place, and time.  (-) SLR bilaterally  Skin: Skin is warm and dry.  Psychiatric: She has a normal mood and affect. Her behavior is normal.  Vitals reviewed.         Assessment & Plan:

## 2014-10-03 NOTE — Patient Instructions (Signed)
Schedule your complete physical in 3-4 months We'll notify you of your lab results and make any changes if needed Keep up the good work!  You look great! The back pain appears to be muscular- tylenol or ibuprofen as needed Call with any questions or concerns Have a great summer!!!!

## 2014-10-03 NOTE — Assessment & Plan Note (Signed)
New.  Pt's pain appears to be muscular.  Mild TTP over lumbar paraspinal muscles.  No CVA tenderness.  Check UA to r/o infxn.  Reviewed supportive care.

## 2014-10-03 NOTE — Assessment & Plan Note (Signed)
Chronic problem.  Doing well on Januvia.  UTD on eye exam, foot exam.  On ACE for renal protection.  Check labs.  Adjust meds prn

## 2015-02-20 ENCOUNTER — Other Ambulatory Visit: Payer: Self-pay | Admitting: Family Medicine

## 2015-02-20 NOTE — Telephone Encounter (Signed)
Medication filled to pharmacy as requested.   

## 2015-02-26 ENCOUNTER — Encounter: Payer: Medicare Other | Admitting: Family Medicine

## 2015-04-14 ENCOUNTER — Other Ambulatory Visit: Payer: Self-pay

## 2015-04-14 ENCOUNTER — Encounter: Payer: Self-pay | Admitting: Family Medicine

## 2015-04-14 ENCOUNTER — Ambulatory Visit (INDEPENDENT_AMBULATORY_CARE_PROVIDER_SITE_OTHER): Payer: Medicare Other | Admitting: Family Medicine

## 2015-04-14 VITALS — BP 127/69 | HR 88 | Temp 98.7°F | Ht 66.0 in | Wt 175.4 lb

## 2015-04-14 DIAGNOSIS — M810 Age-related osteoporosis without current pathological fracture: Secondary | ICD-10-CM

## 2015-04-14 DIAGNOSIS — Z1231 Encounter for screening mammogram for malignant neoplasm of breast: Secondary | ICD-10-CM

## 2015-04-14 DIAGNOSIS — E119 Type 2 diabetes mellitus without complications: Secondary | ICD-10-CM | POA: Diagnosis not present

## 2015-04-14 DIAGNOSIS — E785 Hyperlipidemia, unspecified: Secondary | ICD-10-CM | POA: Diagnosis not present

## 2015-04-14 DIAGNOSIS — I10 Essential (primary) hypertension: Secondary | ICD-10-CM | POA: Diagnosis not present

## 2015-04-14 DIAGNOSIS — Z0000 Encounter for general adult medical examination without abnormal findings: Secondary | ICD-10-CM

## 2015-04-14 NOTE — Assessment & Plan Note (Signed)
Chronic problem.  Hx of good control.  Has eye exam scheduled.  Foot exam done today.  On ACE for renal protection.  Asymptomatic.  Check labs.  Adjust meds prn

## 2015-04-14 NOTE — Progress Notes (Signed)
   Subjective:    Patient ID: Tracy Williams, female    DOB: 07/22/1945, 70 y.o.   MRN: [id number]  HPI Here today for CPE.  Risk Factors: HTN- chronic problem, on Lisinopril w/ good control.  Denies CP, SOB, HAs, visual changes, edema. Hyperlipidemia- chronic problem, on Lipitor w/o difficulty.  Denies abd pain, N/V, myalgias. DM- chronic problem, on Januvia w/o difficulty.  Not checking sugars regulars.  Due for foot exam, eye exam (has scheduled for 2/4).  Denies symptomatic lows, numbness/tingling of hands/feet. Physical Activity: physically active taking care of critically ill husband Fall Risk: 1 fall, not at elevated risk Depression: denies current sxs Hearing: normal to conversational tones and whispered voice ADL's: independent Cognitive: normal linear thought process, memory and attention intact Home Safety: safe at home, lives w/ husband Height, Weight, BMI, Visual Acuity: see vitals, vision corrected to 20/20 w/ glasses Counseling: UTD on colonoscopy ([facility]- due 2018), has mammo scheduled, no need for paps.  Declines flu shot.  UTD on DEXA Care team reviewed and updated w/ pt Labs Ordered: See A&P Care Plan: See A&P    Review of Systems Patient reports no vision/ hearing changes, adenopathy,fever, weight change,  persistant/recurrent hoarseness , swallowing issues, chest pain, palpitations, edema, persistant/recurrent cough, hemoptysis, dyspnea (rest/exertional/paroxysmal nocturnal), gastrointestinal bleeding (melena, rectal bleeding), abdominal pain, significant heartburn, bowel changes, GU symptoms (dysuria, hematuria, incontinence), Gyn symptoms (abnormal  bleeding, pain),  syncope, focal weakness, memory loss, numbness & tingling, skin/hair/nail changes, abnormal bruising or bleeding, anxiety, or depression.     Objective:   Physical Exam General Appearance:    Alert, cooperative, no distress, appears stated age  Head:    Normocephalic, without obvious abnormality,  atraumatic  Eyes:    PERRL, conjunctiva/corneas clear, EOM's intact, fundi    benign, both eyes  Ears:    Normal TM's and external ear canals, both ears  Nose:   Nares normal, septum midline, mucosa normal, no drainage    or sinus tenderness  Throat:   Lips, mucosa, and tongue normal; teeth and gums normal  Neck:   Supple, symmetrical, trachea midline, no adenopathy;    Thyroid: no enlargement/tenderness/nodules  Back:     Symmetric, no curvature, ROM normal, no CVA tenderness  Lungs:     Clear to auscultation bilaterally, respirations unlabored  Chest Wall:    No tenderness or deformity   Heart:    Regular rate and rhythm, S1 and S2 normal, no murmur, rub   or gallop  Breast Exam:    Deferred to mammo  Abdomen:     Soft, non-tender, bowel sounds active all four quadrants,    no masses, no organomegaly  Genitalia:    Deferred  Rectal:    Extremities:   Extremities normal, atraumatic, no cyanosis or edema  Pulses:   2+ and symmetric all extremities  Skin:   Skin color, texture, turgor normal, no rashes or lesions  Lymph nodes:   Cervical, supraclavicular, and axillary nodes normal  Neurologic:   CNII-XII intact, normal strength, sensation and reflexes    throughout          Assessment & Plan:

## 2015-04-14 NOTE — Patient Instructions (Signed)
Follow up in 3-4 months to recheck diabetes We'll notify you of your lab results and make any changes if needed Keep up the good work on healthy diet and exercise- you look great!!! You are up to date on colonoscopy until next year- yay! You already have your mammo and eye exam scheduled- great job! Call with any questions or concerns If you want to join [place] at the new [place] office, any scheduled appointments will automatically transfer and we will see you at [address] [place] [address] [place], [place], [place] [address] ([place]!) [place] in there!!!

## 2015-04-14 NOTE — Progress Notes (Signed)
Pre visit review using our clinic review tool, if applicable. No additional management support is needed unless otherwise documented below in the visit note. 

## 2015-04-14 NOTE — Assessment & Plan Note (Signed)
Chronic problem.  Adequate control.  Asymptomatic.  Check labs.  No anticipated med changes 

## 2015-04-14 NOTE — Assessment & Plan Note (Signed)
UTD on DEXA.  Check Vit D.  Replete prn.

## 2015-04-14 NOTE — Assessment & Plan Note (Signed)
Chronic problem.  Tolerating statin w/o difficulty.  Check labs.  Adjust meds prn  

## 2015-04-14 NOTE — Assessment & Plan Note (Signed)
Pt's PE WNL.  UTD on colonoscopy (due next year).  Has mammo scheduled.  No need for paps.  UTD on DEXA- due next year.  Declines flu shot.  UTD on pneumonia vaccine.  Check labs.  Anticipatory guidance provided.

## 2015-04-15 LAB — CBC WITH DIFFERENTIAL/PLATELET
BASOS PCT: 0.4 % (ref 0.0–3.0)
Basophils Absolute: 0 10*3/uL (ref 0.0–0.1)
EOS PCT: 2.4 % (ref 0.0–5.0)
Eosinophils Absolute: 0.2 10*3/uL (ref 0.0–0.7)
HEMATOCRIT: 37.2 % (ref 36.0–46.0)
Hemoglobin: 12.2 g/dL (ref 12.0–15.0)
Lymphocytes Relative: 16.7 % (ref 12.0–46.0)
Lymphs Abs: 1.6 10*3/uL (ref 0.7–4.0)
MCHC: 32.7 g/dL (ref 30.0–36.0)
MCV: 85.6 fl (ref 78.0–100.0)
Monocytes Absolute: 0.6 10*3/uL (ref 0.1–1.0)
Monocytes Relative: 5.9 % (ref 3.0–12.0)
Neutro Abs: 6.9 10*3/uL (ref 1.4–7.7)
Neutrophils Relative %: 74.6 % (ref 43.0–77.0)
Platelets: 268 10*3/uL (ref 150.0–400.0)
RBC: 4.34 Mil/uL (ref 3.87–5.11)
RDW: 13.1 % (ref 11.5–15.5)
WBC: 9.3 10*3/uL (ref 4.0–10.5)

## 2015-04-15 LAB — HEPATIC FUNCTION PANEL
ALT: 11 U/L (ref 0–35)
AST: 11 U/L (ref 0–37)
Albumin: 4.1 g/dL (ref 3.5–5.2)
Alkaline Phosphatase: 97 U/L (ref 39–117)
BILIRUBIN TOTAL: 0.9 mg/dL (ref 0.2–1.2)
Bilirubin, Direct: 0.2 mg/dL (ref 0.0–0.3)
Total Protein: 6.9 g/dL (ref 6.0–8.3)

## 2015-04-15 LAB — BASIC METABOLIC PANEL
BUN: 19 mg/dL (ref 6–23)
CHLORIDE: 103 meq/L (ref 96–112)
CO2: 29 mEq/L (ref 19–32)
CREATININE: 0.9 mg/dL (ref 0.40–1.20)
Calcium: 9.1 mg/dL (ref 8.4–10.5)
GFR: 65.91 mL/min (ref >60.00)
Glucose, Bld: 129 mg/dL — ABNORMAL HIGH (ref 70–99)
POTASSIUM: 4.4 meq/L (ref 3.5–5.1)
Sodium: 140 mEq/L (ref 135–145)

## 2015-04-15 LAB — VITAMIN D 25 HYDROXY (VIT D DEFICIENCY, FRACTURES): VITD: 10.21 ng/mL — ABNORMAL LOW (ref 30.00–100.00)

## 2015-04-15 LAB — LIPID PANEL
CHOL/HDL RATIO: 5
Cholesterol: 180 mg/dL (ref 0–200)
HDL: 37.6 mg/dL — ABNORMAL LOW (ref >39.00)
LDL CALC: 116 mg/dL — AB (ref 0–99)
NONHDL: 142.85
TRIGLYCERIDES: 136 mg/dL (ref 0.0–149.0)
VLDL: 27.2 mg/dL (ref 0.0–40.0)

## 2015-04-15 LAB — HEMOGLOBIN A1C: Hgb A1c MFr Bld: 7.7 % — ABNORMAL HIGH (ref 4.6–6.5)

## 2015-04-15 LAB — TSH: TSH: 0.64 u[IU]/mL (ref 0.35–4.50)

## 2015-04-17 ENCOUNTER — Other Ambulatory Visit: Payer: Self-pay | Admitting: General Practice

## 2015-04-17 MED ORDER — VITAMIN D (ERGOCALCIFEROL) 1.25 MG (50000 UNIT) PO CAPS
50000.0000 [IU] | ORAL_CAPSULE | ORAL | Status: DC
Start: 2015-04-17 — End: 2015-08-13

## 2015-04-20 ENCOUNTER — Other Ambulatory Visit: Payer: Self-pay | Admitting: Family Medicine

## 2015-04-22 ENCOUNTER — Ambulatory Visit: Payer: Medicare Other | Admitting: Family Medicine

## 2015-05-02 DIAGNOSIS — E119 Type 2 diabetes mellitus without complications: Secondary | ICD-10-CM | POA: Diagnosis not present

## 2015-05-02 DIAGNOSIS — H524 Presbyopia: Secondary | ICD-10-CM | POA: Diagnosis not present

## 2015-05-02 DIAGNOSIS — H2513 Age-related nuclear cataract, bilateral: Secondary | ICD-10-CM | POA: Diagnosis not present

## 2015-05-02 DIAGNOSIS — H43813 Vitreous degeneration, bilateral: Secondary | ICD-10-CM | POA: Diagnosis not present

## 2015-05-02 LAB — HM DIABETES EYE EXAM

## 2015-05-06 ENCOUNTER — Encounter: Payer: Self-pay | Admitting: General Practice

## 2015-05-16 ENCOUNTER — Ambulatory Visit: Payer: Medicare Other

## 2015-07-14 ENCOUNTER — Ambulatory Visit: Payer: Medicare Other | Admitting: Family Medicine

## 2015-07-14 ENCOUNTER — Ambulatory Visit
Admission: RE | Admit: 2015-07-14 | Discharge: 2015-07-14 | Disposition: A | Discharge status: Home / Self Care | Payer: Medicare Other | Source: Ambulatory Visit

## 2015-07-14 DIAGNOSIS — Z1231 Encounter for screening mammogram for malignant neoplasm of breast: Secondary | ICD-10-CM | POA: Diagnosis not present

## 2015-07-23 ENCOUNTER — Telehealth: Payer: Self-pay | Admitting: General Practice

## 2015-07-23 NOTE — Telephone Encounter (Signed)
Med denied, pt needs to continue OTC vitamin D 2000iu daily.  

## 2015-08-13 ENCOUNTER — Encounter: Payer: Self-pay | Admitting: Family Medicine

## 2015-08-13 ENCOUNTER — Ambulatory Visit (INDEPENDENT_AMBULATORY_CARE_PROVIDER_SITE_OTHER): Payer: Medicare Other | Admitting: Family Medicine

## 2015-08-13 VITALS — BP 121/68 | HR 67 | Temp 98.0°F | Resp 16 | Ht 66.0 in | Wt 171.4 lb

## 2015-08-13 DIAGNOSIS — E119 Type 2 diabetes mellitus without complications: Secondary | ICD-10-CM

## 2015-08-13 NOTE — Progress Notes (Signed)
   Subjective:    Patient ID: Eileen Stanford, female    DOB: 06/29/1945, 70 y.o.   MRN: [id number]  HPI DM- chronic problem, previously on Metformin but was intolerant.  Is working on weight loss w/ healthy diet and regular exercise- down another 4 lbs.  Pt was on Januvia but stopped this after last visit.  UTD on eye exam, foot exam.  On ACE for renal protection.  Pt reports feeling well- admits to fatigue w/ husband's recent hospitalization.  Denies symptomatic lows.  No CP, SOB, HAs, visual changes, edema, numbness/tingling of hands/feet.   Review of Systems For ROS see HPI     Objective:   Physical Exam  Constitutional: She is oriented to person, place, and time. She appears well-developed and well-nourished. No distress.  HENT:  Head: Normocephalic and atraumatic.  Eyes: Conjunctivae and EOM are normal. Pupils are equal, round, and reactive to light.  Neck: Normal range of motion. Neck supple. No thyromegaly present.  Cardiovascular: Normal rate, regular rhythm, normal heart sounds and intact distal pulses.   No murmur heard. Pulmonary/Chest: Effort normal and breath sounds normal. No respiratory distress.  Abdominal: Soft. She exhibits no distension. There is no tenderness.  Musculoskeletal: She exhibits no edema.  Lymphadenopathy:    She has no cervical adenopathy.  Neurological: She is alert and oriented to person, place, and time.  Skin: Skin is warm and dry.  Psychiatric: She has a normal mood and affect. Her behavior is normal.  Vitals reviewed.         Assessment & Plan:

## 2015-08-13 NOTE — Patient Instructions (Signed)
Follow up in 3-4 months to recheck diabetes and cholesterol We'll notify you of your lab results and make any changes if needed Keep up the good work!  You look great! Call with any questions or concerns Thanks for sticking with [place]!!! [place]!!!

## 2015-08-13 NOTE — Assessment & Plan Note (Signed)
Chronic problem.  After last visit, pt stopped Januvia b/c 'i didn't feel any different'.  Not checking CBGs at home.  UTD on eye exam, foot exam.  On ACE for renal protection.  Asymptomatic.  Check labs.  Restart meds prn.  Pt expressed understanding and is in agreement w/ plan.

## 2015-08-13 NOTE — Progress Notes (Signed)
Pre visit review using our clinic review tool, if applicable. No additional management support is needed unless otherwise documented below in the visit note. 

## 2015-08-14 LAB — BASIC METABOLIC PANEL
BUN: 16 mg/dL (ref 6–23)
CO2: 30 mEq/L (ref 19–32)
Calcium: 9.7 mg/dL (ref 8.4–10.5)
Chloride: 101 mEq/L (ref 96–112)
Creatinine, Ser: 0.9 mg/dL (ref 0.40–1.20)
GFR: 65.85 mL/min (ref >60.00)
GLUCOSE: 263 mg/dL — AB (ref 70–99)
POTASSIUM: 4.9 meq/L (ref 3.5–5.1)
SODIUM: 137 meq/L (ref 135–145)

## 2015-08-14 LAB — HEMOGLOBIN A1C: HEMOGLOBIN A1C: 10.2 % — AB (ref 4.6–6.5)

## 2015-08-15 ENCOUNTER — Other Ambulatory Visit: Payer: Self-pay | Admitting: General Practice

## 2015-08-15 MED ORDER — SITAGLIPTIN PHOSPHATE 100 MG PO TABS: 100.0000 mg | ORAL_TABLET | Freq: Every day | ORAL | Status: DC

## 2015-10-24 ENCOUNTER — Other Ambulatory Visit: Payer: Self-pay | Admitting: Family Medicine

## 2015-10-31 ENCOUNTER — Ambulatory Visit (INDEPENDENT_AMBULATORY_CARE_PROVIDER_SITE_OTHER): Payer: Medicare Other | Admitting: Family Medicine

## 2015-10-31 ENCOUNTER — Encounter: Payer: Self-pay | Admitting: Family Medicine

## 2015-10-31 VITALS — BP 117/70 | HR 75 | Temp 98.0°F | Resp 16 | Ht 66.0 in | Wt 174.1 lb

## 2015-10-31 DIAGNOSIS — B369 Superficial mycosis, unspecified: Secondary | ICD-10-CM | POA: Diagnosis not present

## 2015-10-31 DIAGNOSIS — L255 Unspecified contact dermatitis due to plants, except food: Secondary | ICD-10-CM | POA: Diagnosis not present

## 2015-10-31 MED ORDER — PREDNISONE 10 MG PO TABS: ORAL_TABLET | ORAL | 0 refills | Status: DC

## 2015-10-31 MED ORDER — TRIAMCINOLONE ACETONIDE 0.1 % EX OINT: 1.0000 "application " | TOPICAL_OINTMENT | Freq: Two times a day (BID) | CUTANEOUS | 1 refills | Status: DC

## 2015-10-31 NOTE — Progress Notes (Signed)
   Subjective:    Patient ID: Tracy Williams, female    DOB: [birth year], 70 y.o.   MRN: [id number]  HPI Rash- pt was cutting back bushes in the yard last week and a few days later developed small, itchy red bumps which has increased in # and size.  Rash is spreading up the arms.  Has similar bumps on abdomen but not back or legs.     Review of Systems For ROS see HPI     Objective:   Physical Exam  Constitutional: She is oriented to person, place, and time. She appears well-developed and well-nourished. No distress.  HENT:  Head: Normocephalic and atraumatic.  Neurological: She is alert and oriented to person, place, and time.  Skin: Skin is warm and dry. Rash (fungal dermatitis under abdominal pannus, contact dermatitis on arms bilaterally) noted.  Psychiatric: She has a normal mood and affect. Her behavior is normal. Thought content normal.  Vitals reviewed.         Assessment & Plan:  Rash- pt has 2 separate processes going on.  She has fungal dermatitis under abdominal pannus.  Pt has antifungal cream at home.  Also has plant dermatitis on arms bilaterally.  Due to extensive distribution will start Pred taper and topical steroid ointment.  Reviewed supportive care and red flags that should prompt return.  Pt expressed understanding and is in agreement w/ plan.

## 2015-10-31 NOTE — Progress Notes (Signed)
Pre visit review using our clinic review tool, if applicable. No additional management support is needed unless otherwise documented below in the visit note. 

## 2015-10-31 NOTE — Patient Instructions (Signed)
Follow up as needed Start the Prednisone as directed- take w/ food Use the Triamcinolone ointment twice daily on the itchy areas Use the antifungal cream that you have at home on the abdomen- twice daily Call with any questions or concerns Hang in there!!!

## 2015-11-08 ENCOUNTER — Encounter (HOSPITAL_COMMUNITY): Payer: Self-pay

## 2015-11-08 ENCOUNTER — Emergency Department (HOSPITAL_COMMUNITY): Payer: Medicare Other

## 2015-11-08 DIAGNOSIS — M1711 Unilateral primary osteoarthritis, right knee: Secondary | ICD-10-CM | POA: Diagnosis present

## 2015-11-08 DIAGNOSIS — R1011 Right upper quadrant pain: Secondary | ICD-10-CM | POA: Diagnosis not present

## 2015-11-08 DIAGNOSIS — I10 Essential (primary) hypertension: Secondary | ICD-10-CM | POA: Diagnosis present

## 2015-11-08 DIAGNOSIS — R112 Nausea with vomiting, unspecified: Secondary | ICD-10-CM | POA: Diagnosis not present

## 2015-11-08 DIAGNOSIS — E119 Type 2 diabetes mellitus without complications: Secondary | ICD-10-CM | POA: Diagnosis present

## 2015-11-08 DIAGNOSIS — K219 Gastro-esophageal reflux disease without esophagitis: Secondary | ICD-10-CM | POA: Diagnosis present

## 2015-11-08 DIAGNOSIS — Z833 Family history of diabetes mellitus: Secondary | ICD-10-CM

## 2015-11-08 DIAGNOSIS — N179 Acute kidney failure, unspecified: Secondary | ICD-10-CM | POA: Diagnosis present

## 2015-11-08 DIAGNOSIS — R1010 Upper abdominal pain, unspecified: Secondary | ICD-10-CM | POA: Diagnosis not present

## 2015-11-08 DIAGNOSIS — R1013 Epigastric pain: Secondary | ICD-10-CM | POA: Diagnosis not present

## 2015-11-08 DIAGNOSIS — R7989 Other specified abnormal findings of blood chemistry: Secondary | ICD-10-CM | POA: Diagnosis present

## 2015-11-08 DIAGNOSIS — Z8249 Family history of ischemic heart disease and other diseases of the circulatory system: Secondary | ICD-10-CM

## 2015-11-08 DIAGNOSIS — Z79899 Other long term (current) drug therapy: Secondary | ICD-10-CM

## 2015-11-08 DIAGNOSIS — Z885 Allergy status to narcotic agent status: Secondary | ICD-10-CM

## 2015-11-08 DIAGNOSIS — K859 Acute pancreatitis without necrosis or infection, unspecified: Secondary | ICD-10-CM | POA: Diagnosis not present

## 2015-11-08 DIAGNOSIS — K8689 Other specified diseases of pancreas: Secondary | ICD-10-CM | POA: Diagnosis not present

## 2015-11-08 DIAGNOSIS — Z96651 Presence of right artificial knee joint: Secondary | ICD-10-CM | POA: Diagnosis present

## 2015-11-08 DIAGNOSIS — Z7952 Long term (current) use of systemic steroids: Secondary | ICD-10-CM

## 2015-11-08 DIAGNOSIS — Z888 Allergy status to other drugs, medicaments and biological substances status: Secondary | ICD-10-CM

## 2015-11-08 DIAGNOSIS — D72829 Elevated white blood cell count, unspecified: Secondary | ICD-10-CM | POA: Diagnosis not present

## 2015-11-08 DIAGNOSIS — T380X5A Adverse effect of glucocorticoids and synthetic analogues, initial encounter: Secondary | ICD-10-CM | POA: Diagnosis present

## 2015-11-08 DIAGNOSIS — Z803 Family history of malignant neoplasm of breast: Secondary | ICD-10-CM

## 2015-11-08 DIAGNOSIS — R079 Chest pain, unspecified: Secondary | ICD-10-CM | POA: Diagnosis not present

## 2015-11-08 LAB — CBC
HCT: 39.1 % (ref 36.0–46.0)
HEMOGLOBIN: 13.3 g/dL (ref 12.0–15.0)
MCH: 29 pg (ref 26.0–34.0)
MCHC: 34 g/dL (ref 30.0–36.0)
MCV: 85.2 fL (ref 78.0–100.0)
Platelets: 251 10*3/uL (ref 150–400)
RBC: 4.59 MIL/uL (ref 3.87–5.11)
RDW: 13.5 % (ref 11.5–15.5)
WBC: 20.6 10*3/uL — ABNORMAL HIGH (ref 4.0–10.5)

## 2015-11-08 MED ORDER — ONDANSETRON 4 MG PO TBDP: 4.0000 mg | ORAL_TABLET | Freq: Once | ORAL | Status: DC | PRN

## 2015-11-08 NOTE — ED Triage Notes (Addendum)
PT C/O UPPER ABDOMINAL PAIN RADIATING TO THE CHEST AND LEFT SHOULDER SINCE 730 PM TONIGHT WITH NAUSEA.

## 2015-11-09 ENCOUNTER — Emergency Department (HOSPITAL_COMMUNITY): Payer: Medicare Other

## 2015-11-09 ENCOUNTER — Observation Stay (HOSPITAL_COMMUNITY): Payer: Medicare Other

## 2015-11-09 ENCOUNTER — Encounter (HOSPITAL_COMMUNITY): Payer: Self-pay | Admitting: Radiology

## 2015-11-09 ENCOUNTER — Inpatient Hospital Stay (HOSPITAL_COMMUNITY)
Admission: EM | Admit: 2015-11-09 | Discharge: 2015-11-11 | DRG: 439 | Disposition: A | Discharge status: Home / Self Care | Payer: Medicare Other | Attending: Family Medicine | Admitting: Family Medicine

## 2015-11-09 DIAGNOSIS — R1011 Right upper quadrant pain: Secondary | ICD-10-CM | POA: Diagnosis not present

## 2015-11-09 DIAGNOSIS — Z794 Long term (current) use of insulin: Secondary | ICD-10-CM

## 2015-11-09 DIAGNOSIS — E118 Type 2 diabetes mellitus with unspecified complications: Secondary | ICD-10-CM

## 2015-11-09 DIAGNOSIS — N179 Acute kidney failure, unspecified: Secondary | ICD-10-CM | POA: Diagnosis not present

## 2015-11-09 DIAGNOSIS — K8689 Other specified diseases of pancreas: Secondary | ICD-10-CM

## 2015-11-09 DIAGNOSIS — K869 Disease of pancreas, unspecified: Secondary | ICD-10-CM | POA: Diagnosis not present

## 2015-11-09 DIAGNOSIS — D72829 Elevated white blood cell count, unspecified: Secondary | ICD-10-CM | POA: Diagnosis present

## 2015-11-09 DIAGNOSIS — R935 Abnormal findings on diagnostic imaging of other abdominal regions, including retroperitoneum: Secondary | ICD-10-CM | POA: Diagnosis not present

## 2015-11-09 DIAGNOSIS — R1110 Vomiting, unspecified: Secondary | ICD-10-CM

## 2015-11-09 DIAGNOSIS — I10 Essential (primary) hypertension: Secondary | ICD-10-CM | POA: Diagnosis present

## 2015-11-09 DIAGNOSIS — R112 Nausea with vomiting, unspecified: Secondary | ICD-10-CM

## 2015-11-09 DIAGNOSIS — E119 Type 2 diabetes mellitus without complications: Secondary | ICD-10-CM | POA: Diagnosis not present

## 2015-11-09 DIAGNOSIS — R1010 Upper abdominal pain, unspecified: Secondary | ICD-10-CM

## 2015-11-09 LAB — COMPREHENSIVE METABOLIC PANEL
ALBUMIN: 4.3 g/dL (ref 3.5–5.0)
ALK PHOS: 99 U/L (ref 38–126)
ALT: 16 U/L (ref 14–54)
AST: 12 U/L — ABNORMAL LOW (ref 15–41)
Anion gap: 10 (ref 5–15)
BILIRUBIN TOTAL: 1 mg/dL (ref 0.3–1.2)
BUN: 34 mg/dL — ABNORMAL HIGH (ref 6–20)
CALCIUM: 9.3 mg/dL (ref 8.9–10.3)
CO2: 27 mmol/L (ref 22–32)
CREATININE: 1.41 mg/dL — AB (ref 0.44–1.00)
Chloride: 98 mmol/L — ABNORMAL LOW (ref 101–111)
GFR calc Af Amer: 43 mL/min — ABNORMAL LOW (ref >60)
GFR calc non Af Amer: 37 mL/min — ABNORMAL LOW (ref >60)
GLUCOSE: 245 mg/dL — AB (ref 65–99)
Potassium: 4.5 mmol/L (ref 3.5–5.1)
SODIUM: 135 mmol/L (ref 135–145)
TOTAL PROTEIN: 7.1 g/dL (ref 6.5–8.1)

## 2015-11-09 LAB — LIPASE, BLOOD: Lipase: 54 U/L — ABNORMAL HIGH (ref 11–51)

## 2015-11-09 LAB — URINALYSIS, ROUTINE W REFLEX MICROSCOPIC
BILIRUBIN URINE: NEGATIVE
GLUCOSE, UA: NEGATIVE mg/dL
HGB URINE DIPSTICK: NEGATIVE
KETONES UR: NEGATIVE mg/dL
Leukocytes, UA: NEGATIVE
Nitrite: NEGATIVE
PH: 5 (ref 5.0–8.0)
PROTEIN: NEGATIVE mg/dL
Specific Gravity, Urine: 1.022 (ref 1.005–1.030)

## 2015-11-09 LAB — BASIC METABOLIC PANEL
Anion gap: 5 (ref 5–15)
BUN: 24 mg/dL — AB (ref 6–20)
CO2: 27 mmol/L (ref 22–32)
CREATININE: 1.02 mg/dL — AB (ref 0.44–1.00)
Calcium: 8.2 mg/dL — ABNORMAL LOW (ref 8.9–10.3)
Chloride: 104 mmol/L (ref 101–111)
GFR calc Af Amer: >60 mL/min (ref >60)
GFR, EST NON AFRICAN AMERICAN: 55 mL/min — AB (ref >60)
Glucose, Bld: 247 mg/dL — ABNORMAL HIGH (ref 65–99)
Potassium: 4.8 mmol/L (ref 3.5–5.1)
SODIUM: 136 mmol/L (ref 135–145)

## 2015-11-09 LAB — GLUCOSE, CAPILLARY
GLUCOSE-CAPILLARY: 142 mg/dL — AB (ref 65–99)
GLUCOSE-CAPILLARY: 275 mg/dL — AB (ref 65–99)
GLUCOSE-CAPILLARY: 218 mg/dL — AB (ref 65–99)
Glucose-Capillary: 252 mg/dL — ABNORMAL HIGH (ref 65–99)

## 2015-11-09 LAB — I-STAT TROPONIN, ED: Troponin i, poc: 0.01 ng/mL (ref 0.00–0.08)

## 2015-11-09 LAB — LACTIC ACID, PLASMA
LACTIC ACID, VENOUS: 1.1 mmol/L (ref 0.5–1.9)
Lactic Acid, Venous: 2 mmol/L (ref 0.5–1.9)

## 2015-11-09 MED ORDER — GI COCKTAIL ~~LOC~~
30.0000 mL | Freq: Once | ORAL | Status: DC
  Filled 2015-11-09: qty 30

## 2015-11-09 MED ORDER — HEPARIN SODIUM (PORCINE) 5000 UNIT/ML IJ SOLN
5000.0000 [IU] | Freq: Three times a day (TID) | INTRAMUSCULAR | Status: DC
  Administered 2015-11-09 – 2015-11-10 (×4): 5000 [IU] via SUBCUTANEOUS
  Filled 2015-11-09 (×5): qty 1

## 2015-11-09 MED ORDER — FAMOTIDINE 20 MG PO TABS
20.0000 mg | ORAL_TABLET | Freq: Two times a day (BID) | ORAL | Status: DC
  Administered 2015-11-09 – 2015-11-10 (×4): 20 mg via ORAL
  Filled 2015-11-09 (×3): qty 1

## 2015-11-09 MED ORDER — SODIUM CHLORIDE 0.9 % IV SOLN
INTRAVENOUS | Status: AC
  Administered 2015-11-09: 04:00:00 via INTRAVENOUS

## 2015-11-09 MED ORDER — TRIAMCINOLONE ACETONIDE 0.1 % EX OINT
1.0000 "application " | TOPICAL_OINTMENT | Freq: Two times a day (BID) | CUTANEOUS | Status: DC
  Administered 2015-11-09 – 2015-11-11 (×5): 1 via TOPICAL
  Filled 2015-11-09: qty 15

## 2015-11-09 MED ORDER — HYDRALAZINE HCL 20 MG/ML IJ SOLN: 10.0000 mg | INTRAMUSCULAR | Status: DC | PRN

## 2015-11-09 MED ORDER — INSULIN ASPART 100 UNIT/ML ~~LOC~~ SOLN
0.0000 [IU] | Freq: Three times a day (TID) | SUBCUTANEOUS | Status: DC
  Administered 2015-11-09: 3 [IU] via SUBCUTANEOUS
  Administered 2015-11-09: 5 [IU] via SUBCUTANEOUS
  Administered 2015-11-09: 1 [IU] via SUBCUTANEOUS
  Administered 2015-11-10 (×2): 2 [IU] via SUBCUTANEOUS
  Administered 2015-11-10: 3 [IU] via SUBCUTANEOUS
  Administered 2015-11-11: 2 [IU] via SUBCUTANEOUS

## 2015-11-09 MED ORDER — IOPAMIDOL (ISOVUE-300) INJECTION 61%
80.0000 mL | Freq: Once | INTRAVENOUS | Status: AC | PRN
  Administered 2015-11-09: 80 mL via INTRAVENOUS

## 2015-11-09 MED ORDER — SODIUM CHLORIDE 0.9 % IV BOLUS (SEPSIS)
1000.0000 mL | Freq: Once | INTRAVENOUS | Status: AC
  Administered 2015-11-09: 1000 mL via INTRAVENOUS

## 2015-11-09 MED ORDER — DIPHENHYDRAMINE HCL 50 MG/ML IJ SOLN
25.0000 mg | Freq: Once | INTRAMUSCULAR | Status: AC
  Administered 2015-11-09: 25 mg via INTRAVENOUS
  Filled 2015-11-09: qty 1

## 2015-11-09 MED ORDER — METOCLOPRAMIDE HCL 5 MG/ML IJ SOLN
10.0000 mg | Freq: Once | INTRAMUSCULAR | Status: AC
  Administered 2015-11-09: 10 mg via INTRAVENOUS
  Filled 2015-11-09: qty 2

## 2015-11-09 MED ORDER — KETOROLAC TROMETHAMINE 15 MG/ML IJ SOLN
15.0000 mg | Freq: Four times a day (QID) | INTRAMUSCULAR | Status: DC | PRN
  Administered 2015-11-09 – 2015-11-10 (×4): 15 mg via INTRAVENOUS
  Filled 2015-11-09 (×4): qty 1

## 2015-11-09 MED ORDER — IBUPROFEN 200 MG PO TABS
400.0000 mg | ORAL_TABLET | Freq: Four times a day (QID) | ORAL | Status: DC | PRN
Start: 2015-11-09 — End: 2015-11-09
  Administered 2015-11-09 (×2): 400 mg via ORAL
  Filled 2015-11-09 (×2): qty 2

## 2015-11-09 MED ORDER — ATORVASTATIN CALCIUM 10 MG PO TABS: 20.0000 mg | ORAL_TABLET | Freq: Every day | ORAL | Status: DC

## 2015-11-09 MED ORDER — ONDANSETRON HCL 4 MG/2ML IJ SOLN
4.0000 mg | Freq: Once | INTRAMUSCULAR | Status: AC
  Administered 2015-11-09: 4 mg via INTRAVENOUS
  Filled 2015-11-09: qty 2

## 2015-11-09 MED ORDER — HYDROMORPHONE HCL 1 MG/ML IJ SOLN: 1.0000 mg | Freq: Once | INTRAMUSCULAR | Status: DC

## 2015-11-09 MED ORDER — MORPHINE SULFATE (PF) 2 MG/ML IV SOLN: 1.0000 mg | INTRAVENOUS | Status: DC | PRN

## 2015-11-09 NOTE — Progress Notes (Signed)
MD returned call.  Will see pt soon.

## 2015-11-09 NOTE — H&P (Signed)
History and Physical    Tracy Williams [id number] DOB: 15-Aug-1945 DOA: 11/09/2015  PCP: [name], MD   Patient coming from: Home   Chief Complaint: Nausea, vomiting, upper abdominal pain  HPI: Tracy Williams is a 70 y.o. female with medical history significant for hypertension, type 2 diabetes mellitus, GERD, and osteoarthritis who presents the emergency department for evaluation of abdominal pain, nausea, and nonbloody nonbilious vomiting which developed shortly prior to arrival. Patient reports pain in her usual state of health until the rather sudden onset of pain across the upper abdomen radiating towards the left shoulder and associated with nausea and vomiting. Symptoms began at approximately 7:30 PM on the night of her presentation, continued to worsen, prompting her visit to the ED. She denies any fevers or chills, chest pain or palpitations, dyspnea or cough. There has been no diarrhea, melena, or hematochezia. Patient has experienced GI upset with multiple medications in the past and had recently been prescribed a prednisone taper for suspected contact dermatitis. She had been tolerating the prednisone well for about a week and he just finished the taper today. Patient denies any urinary symptoms or flank pain. She reports a history of breast cancer in her mother and denies a personal history of smoking or significant alcohol use.  ED Course: Upon arrival to the ED, patient is found to be afebrile, saturating well on room air, tachycardic low 100s, and hypertensive to 190/76. Chemistry panels notable for a BUN of 34 and serum creatinine 1.41, up from an apparent baseline of 0.9. CBC is notable for a leukocytosis to 20,600. Troponin is 0.01, lipase 54, and urinalysis unremarkable. Chest x-ray was obtained and notable only for mild left basilar atelectasis. Patient was given Benadryl, Dilaudid, Zofran, and Reglan in the emergency department and continued to be symptomatic. 2 L  of normal saline were bolused with resolution and tachycardia. CT of the abdomen and pelvis was obtained and it is concerning for a primary pancreatic malignancy with a 4.7 x 3.7 x 4.0 cm minimally heterogenous mass at the distal body of the pancreas and suggestion of surrounding angiogenesis. LFTs are normal. MRCP is recommended. Patient is remained hemodynamically stable in the ED and her symptoms have improved with the symptomatic measures performed in the ED. She will be observed on the medical surgical unit for ongoing evaluation and management of nausea and vomiting with a pancreatic mass on CT concerning for primary pancreatic malignancy.  Review of Systems:  All other systems reviewed and apart from HPI, are negative.  Past Medical History:  Diagnosis Date  . Allergy   . Arthritis   . Diabetes mellitus without complication ([place])   . Dysrhythmia    Hx: of palpitations in 2010 only  . GERD (gastroesophageal reflux disease)   . Hypertension   . PONV (postoperative nausea and vomiting)   . Right knee DJD     Past Surgical History:  Procedure Laterality Date  . CESAREAN SECTION  1975  . COLONOSCOPY W/ BIOPSIES AND POLYPECTOMY     Hx: of in 2012  . DILATION AND CURETTAGE OF UTERUS     Hx: of 1978  . JOINT REPLACEMENT     Hx: of left Knee 2010  . TOTAL KNEE ARTHROPLASTY  2010  . TOTAL KNEE ARTHROPLASTY Right 06/26/2012   Dr Noemi Chapel  . TOTAL KNEE ARTHROPLASTY Right 06/26/2012   Procedure: TOTAL KNEE ARTHROPLASTY- right;  Surgeon: Lorn Junes, MD;  Location: [facility];  Service: Orthopedics;  Laterality: Right;  reports that she has never smoked. She has never used smokeless tobacco. She reports that she does not drink alcohol or use drugs.  Allergies  Allergen Reactions  . Metformin And Related Nausea And Vomiting  . Darvocet [Propoxyphene N-Acetaminophen] Nausea And Vomiting  . Demerol [Meperidine] Nausea And Vomiting  . Hydrochlorothiazide Nausea And Vomiting  . Percocet  [Oxycodone-Acetaminophen] Nausea And Vomiting  . Vicodin [Hydrocodone-Acetaminophen] Nausea And Vomiting    Family History  Problem Relation Age of Onset  . Diabetes Mellitus II Father   . Hypertension Father   . Cancer - Other Mother     Breast     Prior to Admission medications   Medication Sig Start Date End Date Taking? Authorizing Provider  atorvastatin (LIPITOR) 20 MG tablet TAKE 1 TABLET (20 MG TOTAL) BY MOUTH AT BEDTIME. 10/27/15  Yes Midge Minium, MD  lisinopril (PRINIVIL,ZESTRIL) 10 MG tablet TAKE 1 TABLET (10 MG TOTAL) BY MOUTH DAILY. 02/20/15  Yes Midge Minium, MD  predniSONE (DELTASONE) 10 MG tablet 3 tabs x3 days and then 2 tabs x3 days and then 1 tab x3 days.  Take w/ food. 10/31/15  Yes Midge Minium, MD  ranitidine (ZANTAC) 150 MG tablet Take 150 mg by mouth 2 (two) times daily as needed for heartburn.   Yes Historical Provider, MD  sitaGLIPtin (JANUVIA) 100 MG tablet Take 1 tablet (100 mg total) by mouth daily. 08/15/15  Yes Midge Minium, MD  triamcinolone ointment (KENALOG) 0.1 % Apply 1 application topically 2 (two) times daily. 10/31/15 10/30/16 Yes Midge Minium, MD    Physical Exam: Vitals:   11/08/15 2247 11/08/15 2248 11/09/15 0245  BP: 189/76  (!) 172/57  Pulse: 105  (!) 101  Resp: 18  20  Temp: 98.2 F (36.8 C)  98.1 F (36.7 C)  TempSrc: Oral  Oral  SpO2: 97%  96%  Weight:  78.5 kg (173 lb)   Height:  5\' 5"  (1.651 m)       Constitutional: NAD, calm, comfortable Eyes: PERTLA, lids and conjunctivae normal ENMT: Mucous membranes are moist. Posterior pharynx clear of any exudate or lesions.   Neck: normal, supple, no masses, no thyromegaly Respiratory: clear to auscultation bilaterally, no wheezing, no crackles. Normal respiratory effort.   Cardiovascular: S1 & S2 heard, regular rate and rhythm. No carotid bruits. No significant JVD. Abdomen: No distension, mild tenderness upper quadrants without rebound pain or guarding, no  masses palpated. Bowel sounds normal.  Musculoskeletal: no clubbing / cyanosis. No joint deformity upper and lower extremities. Normal muscle tone.  Skin: no significant rashes, lesions, ulcers. Warm, dry, well-perfused. Neurologic: CN 2-12 grossly intact. Sensation intact, DTR normal. Strength 5/5 in all 4 limbs.  Psychiatric: Normal judgment and insight. Alert and oriented x 3. Normal mood and affect.     Labs on Admission: I have personally reviewed following labs and imaging studies  CBC:  Recent Labs Lab 11/08/15 2340  WBC 20.6*  HGB 13.3  HCT 39.1  MCV 85.2  PLT 123XX123   Basic Metabolic Panel:  Recent Labs Lab 11/08/15 2340  NA 135  K 4.5  CL 98*  CO2 27  GLUCOSE 245*  BUN 34*  CREATININE 1.41*  CALCIUM 9.3   GFR: Estimated Creatinine Clearance: 39 mL/min (by C-G formula based on SCr of 1.41 mg/dL). Liver Function Tests:  Recent Labs Lab 11/08/15 2340  AST 12*  ALT 16  ALKPHOS 99  BILITOT 1.0  PROT 7.1  ALBUMIN 4.3  Recent Labs Lab 11/08/15 2340  LIPASE 54*   No results for input(s): AMMONIA in the last 168 hours. Coagulation Profile: No results for input(s): INR, PROTIME in the last 168 hours. Cardiac Enzymes: No results for input(s): CKTOTAL, CKMB, CKMBINDEX, TROPONINI in the last 168 hours. BNP (last 3 results) No results for input(s): PROBNP in the last 8760 hours. HbA1C: No results for input(s): HGBA1C in the last 72 hours. CBG: No results for input(s): GLUCAP in the last 168 hours. Lipid Profile: No results for input(s): CHOL, HDL, LDLCALC, TRIG, CHOLHDL, LDLDIRECT in the last 72 hours. Thyroid Function Tests: No results for input(s): TSH, T4TOTAL, FREET4, T3FREE, THYROIDAB in the last 72 hours. Anemia Panel: No results for input(s): VITAMINB12, FOLATE, FERRITIN, TIBC, IRON, RETICCTPCT in the last 72 hours. Urine analysis:    Component Value Date/Time   COLORURINE YELLOW 11/08/2015 0043   APPEARANCEUR CLEAR 11/08/2015 0043    LABSPEC 1.022 11/08/2015 0043   PHURINE 5.0 11/08/2015 0043   GLUCOSEU NEGATIVE 11/08/2015 0043   HGBUR NEGATIVE 11/08/2015 0043   BILIRUBINUR NEGATIVE 11/08/2015 0043   BILIRUBINUR Neg 10/03/2014 0915   KETONESUR NEGATIVE 11/08/2015 0043   PROTEINUR NEGATIVE 11/08/2015 0043   UROBILINOGEN 2.0 10/03/2014 0915   UROBILINOGEN 1.0 06/26/2012 0728   NITRITE NEGATIVE 11/08/2015 0043   LEUKOCYTESUR NEGATIVE 11/08/2015 0043   Sepsis Labs: @LABRCNTIP (procalcitonin:4,lacticidven:4) )No results found for this or any previous visit (from the past 240 hour(s)).   Radiological Exams on Admission: Dg Chest 2 View  Result Date: 11/08/2015 CLINICAL DATA:  Left-sided chest pain EXAM: CHEST  2 VIEW COMPARISON:  06/22/2012 FINDINGS: Flat opacity at the left base has an atelectatic appearance. There is no edema, consolidation, effusion, or pneumothorax. Normal heart size and mediastinal contours. No visible osseous abnormality. IMPRESSION: Mild left basilar atelectasis. Electronically Signed   By: Monte Fantasia M.D.   On: 11/08/2015 23:30   Ct Abdomen Pelvis W Contrast  Result Date: 11/09/2015 CLINICAL DATA:  Acute onset of upper abdominal pain, radiating to the chest and left shoulder. Nausea. Initial encounter. EXAM: CT ABDOMEN AND PELVIS WITH CONTRAST TECHNIQUE: Multidetector CT imaging of the abdomen and pelvis was performed using the standard protocol following bolus administration of intravenous contrast. CONTRAST:  85mL ISOVUE-300 IOPAMIDOL (ISOVUE-300) INJECTION 61% COMPARISON:  None. FINDINGS: Bibasilar atelectasis or scarring is noted. A small hiatal hernia noted. The liver and spleen are unremarkable in appearance. The gallbladder is within normal limits. The adrenal glands are grossly unremarkable in appearance. There is a large minimally heterogeneous mass noted at the distal body of the pancreas, measuring 4.7 x 3.7 x 4.0 cm, with minimal surrounding soft tissue inflammation. There is suggestion  of surrounding angiogenesis. Nonspecific perinephric stranding is noted bilaterally. There is no evidence of hydronephrosis. No renal or ureteral stones are identified. No free fluid is identified. The small bowel is unremarkable in appearance. The stomach is within normal limits. No acute vascular abnormalities are seen. Scattered calcification is noted along the abdominal aorta and its branches. The appendix is normal in caliber and no definite evidence of appendicitis. The colon is grossly unremarkable in appearance. The bladder is mildly distended and grossly unremarkable. The uterus contains a fibroid, measuring 3.4 cm. The ovaries are grossly symmetric. No suspicious adnexal masses are seen. No inguinal lymphadenopathy is seen. No acute osseous abnormalities are identified. Vacuum phenomenon is noted at L5-S1. IMPRESSION: 1. Large minimally heterogeneous mass at the distal body of the pancreas, measuring 4.7 x 3.7 x 4.0 cm, with minimal  surrounding soft tissue inflammation. Suggestion of surrounding angiogenesis. This is concerning for primary pancreatic malignancy. Would correlate with LFTs, and recommend MRCP for further evaluation. 2. Scattered calcification along the abdominal aorta and its branches. 3. Uterine fibroid noted. 4. Bibasilar atelectasis or scarring seen. 5. Small hiatal hernia noted. These results were called by telephone at the time of interpretation on 11/09/2015 at 1:34 am to Dr. Leo Grosser, who verbally acknowledged these results. Electronically Signed   By: Garald Balding M.D.   On: 11/09/2015 01:36    EKG: Independently reviewed. Sinus rhythm, VPC  Assessment/Plan  1. Pancreatic mass - Unfortunately, the radiographic appearance suggests primary pancreatic malignancy  - LFT's are wnl, lipase slightly elevated  - MRCP ordered    2. Abdominal pain   - Presumably, this is d/t the mass  - Pain has been well-controlled in ED, will continue analgesics prn    3. AKI   - SCr  1.41 on admission, up from an apparent baseline of 0.9  - Likely a prerenal azotemia in setting of N/V, exacerbated by lisinopril use  - Given 2 liters NS in ED, will continue NS at 100 cc/hr until she is tolerating adequate oral intake  - Hold lisinopril; trend BUN/Cr   4. HTN - BP elevated in ED in setting of N/V and abdominal pain  - Managed with lisinopril at home, currently held d/t AKI  - If HTN persists despite control of pain and nausea, will make hydralazine IVP's available prn    5. Type II DM - A1c was 10.2% in May 2017; had previously been in 7% range for years - Managed with Januvia at home, will hold while in hospital and may need to be changed d/t pancreatic issues  - Check CBG with meals and qHS  - Start with low-intensity SSI correctional and adjust prn   6. Leukocytosis  - WBC 20,600 on admission without fever or apparent focus of infection  - Likely secondary to prednisone use as she has been on taper through PCP for contact dermatitis and was to complete course on the day of her presentation  - Culture if febrile    DVT prophylaxis: sq heparin Code Status: Full  Family Communication: Discussed with patient Disposition Plan: Observe on med-surg Consults called: None Admission status: Observation     Vianne Bulls, MD [facility] Pager [phone]  If 7PM-7AM, please contact night-coverage [email or web address] Password [email or web address]  11/09/2015, 3:22 AM

## 2015-11-09 NOTE — Progress Notes (Signed)
Alerted MD to pt's critical lab of Lactic Acid 2.0

## 2015-11-09 NOTE — Care Management Obs Status (Signed)
[facility] NOTIFICATION   Patient Details  Name: Tracy Williams MRN: [id number] Date of Birth: Feb 12, 1946   Medicare Observation Status Notification Given:  Yes    Apolonio Schneiders, RN 11/09/2015, 3:00 PM

## 2015-11-09 NOTE — ED Provider Notes (Signed)
Medical screening examination/treatment/procedure(s) were conducted as a shared visit with non-physician practitioner(s) and myself.  I personally evaluated the patient during the encounter.   EKG Interpretation  Date/Time:  Saturday November 08 2015 23:17:49 EDT Ventricular Rate:  98 PR Interval:    QRS Duration: 88 QT Interval:  317 QTC Calculation: 405 R Axis:   66 Text Interpretation:  Sinus rhythm Ventricular premature complex Aberrant complex Low voltage, extremity and precordial leads Baseline wander in lead(s) I II aVR No significant change since last tracing Confirmed by Duchess Armendarez MD, Quillian Quince [id number]) on 11/08/2015 11:21:00 PM      70 y.o. female presents with left upper abdominal pain. Has developed left shoulder aching and a mild headache. Scan demonstrating new pancreatic mass, admit to complete workup with MRCP.  See related encounter note  Emergency Focused Ultrasound Exam Limited ultrasound of the gallbladder and common bile duct.   Performed and interpreted by Dr. Laneta Simmers Indication: Right upper quadrant abdominal pain Multiple views of the gallbladder and common bile duct are obtained with a multi-frequency probe.  Findings: no stones, no pericholecystic fluid, no gallbladder wall thickening, no CBD dilation Interpretation: no cholelithiasis, no cholecystitis or choledocholithiasis. Images archived electronically.  CPT Code [id number]    Leo Grosser, MD 11/09/15 2259

## 2015-11-09 NOTE — ED Provider Notes (Signed)
[facility] DEPT Provider Note   CSN: [id number] Arrival date & time: 11/08/15  2223  By signing my name below, I, Jeanell Sparrow, attest that this documentation has been prepared under the direction and in the presence of non-physician practitioner, Gloriann Loan, PA-C. Electronically Signed: Jeanell Sparrow, Scribe. 11/09/2015. 12:10 AM.   History   Chief Complaint Chief Complaint  Patient presents with  . Abdominal Pain   The history is provided by the patient. No language interpreter was used.   HPI Comments: Tracy Williams is a 70 y.o. female who presents to the Emergency Department complaining of constant moderate upper abdominal pain that started about 5 hours ago. She reports that she had a gradual onset of pain when she was playing a game. She states that the pain has worsened since initial onset with associated nausea, and describes the pain as somewhat sharp. She states that her pain radiates to her entire chest, left shoulder, and back. She reports that sitting upright helps the pain, and car ride bumps worsens the pain. She states that she tried drinking soda, and burping without any relief. She currently reports having a constant moderate headache. She states that she last ate about 7 hours ago. She has a hx of a C-section, with no other abdominal surgeries. She denies any SOB, cough, fever, diarrhea, and constipation.    PCP: [name], MD  Past Medical History:  Diagnosis Date  . Allergy   . Arthritis   . Diabetes mellitus without complication ([place])   . Dysrhythmia    Hx: of palpitations in 2010 only  . GERD (gastroesophageal reflux disease)   . Hypertension   . PONV (postoperative nausea and vomiting)   . Right knee DJD     Patient Active Problem List   Diagnosis Date Noted  . Pancreatic mass 11/09/2015  . Bilateral low back pain without sciatica 10/03/2014  . 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 Diabetes mellitus  type 2, controlled, without complications ([place]) 0000000  . Bronchitis 03/19/2011  . General medical examination 11/26/2010  . HTN (hypertension) 11/26/2010  . Depression 11/26/2010  . Colon cancer screening 11/26/2010  . Hyperlipidemia 11/26/2010    Past Surgical History:  Procedure Laterality Date  . CESAREAN SECTION  1975  . COLONOSCOPY W/ BIOPSIES AND POLYPECTOMY     Hx: of in 2012  . DILATION AND CURETTAGE OF UTERUS     Hx: of 1978  . JOINT REPLACEMENT     Hx: of left Knee 2010  . TOTAL KNEE ARTHROPLASTY  2010  . TOTAL KNEE ARTHROPLASTY Right 06/26/2012   Dr Noemi Chapel  . TOTAL KNEE ARTHROPLASTY Right 06/26/2012   Procedure: TOTAL KNEE ARTHROPLASTY- right;  Surgeon: Lorn Junes, MD;  Location: [facility];  Service: Orthopedics;  Laterality: Right;    OB History    No data available       Home Medications    Prior to Admission medications   Medication Sig Start Date End Date Taking? Authorizing Provider  atorvastatin (LIPITOR) 20 MG tablet TAKE 1 TABLET (20 MG TOTAL) BY MOUTH AT BEDTIME. 10/27/15  Yes Midge Minium, MD  lisinopril (PRINIVIL,ZESTRIL) 10 MG tablet TAKE 1 TABLET (10 MG TOTAL) BY MOUTH DAILY. 02/20/15  Yes Midge Minium, MD  predniSONE (DELTASONE) 10 MG tablet 3 tabs x3 days and then 2 tabs x3 days and then 1 tab x3 days.  Take w/ food. 10/31/15  Yes Midge Minium, MD  ranitidine (ZANTAC) 150  MG tablet Take 150 mg by mouth 2 (two) times daily as needed for heartburn.   Yes Historical Provider, MD  sitaGLIPtin (JANUVIA) 100 MG tablet Take 1 tablet (100 mg total) by mouth daily. 08/15/15  Yes Midge Minium, MD  triamcinolone ointment (KENALOG) 0.1 % Apply 1 application topically 2 (two) times daily. 10/31/15 10/30/16 Yes Midge Minium, MD    Family History Family History  Problem Relation Age of Onset  . Diabetes Mellitus II Father   . Hypertension Father   . Cancer - Other Mother     Breast    Social History Social History  Substance  Use Topics  . Smoking status: Never Smoker  . Smokeless tobacco: Never Used  . Alcohol use No     Allergies   Metformin and related; Darvocet [propoxyphene n-acetaminophen]; Demerol [meperidine]; Hydrochlorothiazide; Percocet [oxycodone-acetaminophen]; and Vicodin [hydrocodone-acetaminophen]   Review of Systems Review of Systems  Constitutional: Negative for fever.  Respiratory: Negative for cough and shortness of breath.   Cardiovascular: Positive for chest pain.  Gastrointestinal: Positive for abdominal pain and nausea. Negative for constipation and diarrhea.  Musculoskeletal: Positive for back pain.     Physical Exam Updated Vital Signs BP 189/76 (BP Location: Right Arm) Comment: Pt states has hx of hypertension but did not take her meds today  Pulse 105   Temp 98.2 F (36.8 C) (Oral)   Resp 18   Ht 5\' 5"  (1.651 m)   Wt 173 lb (78.5 kg)   SpO2 97%   BMI 28.79 kg/m   Physical Exam  Constitutional: She is oriented to person, place, and time. She appears well-developed and well-nourished.  Non-toxic appearance. She does not have a sickly appearance. She does not appear ill.  Pt actively vomiting during H&P.   HENT:  Head: Normocephalic and atraumatic.  Mouth/Throat: Oropharynx is clear and moist.  Eyes: Conjunctivae are normal.  Neck: Normal range of motion. Neck supple.  Cardiovascular: Normal rate and regular rhythm.   Pulmonary/Chest: Effort normal and breath sounds normal. No accessory muscle usage or stridor. No respiratory distress. She has no wheezes. She has no rhonchi. She has no rales.  Abdominal: Soft. Bowel sounds are normal. She exhibits no distension. There is tenderness in the right upper quadrant and epigastric area. There is rebound and positive Murphy's sign.  Musculoskeletal: Normal range of motion.  Lymphadenopathy:    She has no cervical adenopathy.  Neurological: She is alert and oriented to person, place, and time.  Speech clear without  dysarthria.  Skin: Skin is warm and dry.  Psychiatric: She has a normal mood and affect. Her behavior is normal.     ED Treatments / Results  DIAGNOSTIC STUDIES: Oxygen Saturation is 97% on RA, normal by my interpretation.    COORDINATION OF CARE: 12:16 AM- Pt advised of plan for treatment, which includes medication, radiology, and lab, and pt agrees.  Labs (all labs ordered are listed, but only abnormal results are displayed) Labs Reviewed  LIPASE, BLOOD - Abnormal; Notable for the following:       Result Value   Lipase 54 (*)    All other components within normal limits  COMPREHENSIVE METABOLIC PANEL - Abnormal; Notable for the following:    Chloride 98 (*)    Glucose, Bld 245 (*)    BUN 34 (*)    Creatinine, Ser 1.41 (*)    AST 12 (*)    GFR calc non Af Amer 37 (*)    GFR calc  Af Amer 43 (*)    All other components within normal limits  CBC - Abnormal; Notable for the following:    WBC 20.6 (*)    All other components within normal limits  URINALYSIS, ROUTINE W REFLEX MICROSCOPIC (NOT AT [facility])  I-STAT TROPOININ, ED    EKG  EKG Interpretation  Date/Time:  Saturday November 08 2015 23:17:49 EDT Ventricular Rate:  98 PR Interval:    QRS Duration: 88 QT Interval:  317 QTC Calculation: 405 R Axis:   66 Text Interpretation:  Sinus rhythm Ventricular premature complex Aberrant complex Low voltage, extremity and precordial leads Baseline wander in lead(s) I II aVR No significant change since last tracing Confirmed by KNOTT MD, DANIEL [phone]) on 11/08/2015 11:21:00 PM       Radiology Dg Chest 2 View  Result Date: 11/08/2015 CLINICAL DATA:  Left-sided chest pain EXAM: CHEST  2 VIEW COMPARISON:  06/22/2012 FINDINGS: Flat opacity at the left base has an atelectatic appearance. There is no edema, consolidation, effusion, or pneumothorax. Normal heart size and mediastinal contours. No visible osseous abnormality. IMPRESSION: Mild left basilar atelectasis. Electronically Signed    By: Monte Fantasia M.D.   On: 11/08/2015 23:30   Ct Abdomen Pelvis W Contrast  Result Date: 11/09/2015 CLINICAL DATA:  Acute onset of upper abdominal pain, radiating to the chest and left shoulder. Nausea. Initial encounter. EXAM: CT ABDOMEN AND PELVIS WITH CONTRAST TECHNIQUE: Multidetector CT imaging of the abdomen and pelvis was performed using the standard protocol following bolus administration of intravenous contrast. CONTRAST:  56mL ISOVUE-300 IOPAMIDOL (ISOVUE-300) INJECTION 61% COMPARISON:  None. FINDINGS: Bibasilar atelectasis or scarring is noted. A small hiatal hernia noted. The liver and spleen are unremarkable in appearance. The gallbladder is within normal limits. The adrenal glands are grossly unremarkable in appearance. There is a large minimally heterogeneous mass noted at the distal body of the pancreas, measuring 4.7 x 3.7 x 4.0 cm, with minimal surrounding soft tissue inflammation. There is suggestion of surrounding angiogenesis. Nonspecific perinephric stranding is noted bilaterally. There is no evidence of hydronephrosis. No renal or ureteral stones are identified. No free fluid is identified. The small bowel is unremarkable in appearance. The stomach is within normal limits. No acute vascular abnormalities are seen. Scattered calcification is noted along the abdominal aorta and its branches. The appendix is normal in caliber and no definite evidence of appendicitis. The colon is grossly unremarkable in appearance. The bladder is mildly distended and grossly unremarkable. The uterus contains a fibroid, measuring 3.4 cm. The ovaries are grossly symmetric. No suspicious adnexal masses are seen. No inguinal lymphadenopathy is seen. No acute osseous abnormalities are identified. Vacuum phenomenon is noted at L5-S1. IMPRESSION: 1. Large minimally heterogeneous mass at the distal body of the pancreas, measuring 4.7 x 3.7 x 4.0 cm, with minimal surrounding soft tissue inflammation. Suggestion  of surrounding angiogenesis. This is concerning for primary pancreatic malignancy. Would correlate with LFTs, and recommend MRCP for further evaluation. 2. Scattered calcification along the abdominal aorta and its branches. 3. Uterine fibroid noted. 4. Bibasilar atelectasis or scarring seen. 5. Small hiatal hernia noted. These results were called by telephone at the time of interpretation on 11/09/2015 at 1:34 am to Dr. Leo Grosser, who verbally acknowledged these results. Electronically Signed   By: Garald Balding M.D.   On: 11/09/2015 01:36    Procedures Procedures (including critical care time)  Medications Ordered in ED Medications  HYDROmorphone (DILAUDID) injection 1 mg (0 mg Intravenous Hold 11/09/15 0148)  sodium chloride 0.9 % bolus 1,000 mL (0 mLs Intravenous Stopped 11/09/15 0151)  ondansetron (ZOFRAN) injection 4 mg (4 mg Intravenous Given 11/09/15 0044)  metoCLOPramide (REGLAN) injection 10 mg (10 mg Intravenous Given 11/09/15 0138)  diphenhydrAMINE (BENADRYL) injection 25 mg (25 mg Intravenous Given 11/09/15 0138)  iopamidol (ISOVUE-300) 61 % injection 80 mL (80 mLs Intravenous Contrast Given 11/09/15 0117)  sodium chloride 0.9 % bolus 1,000 mL (1,000 mLs Intravenous New Bag/Given 11/09/15 0147)     Initial Impression / Assessment and Plan / ED Course  I have reviewed the triage vital signs and the nursing notes.  Pertinent labs & imaging results that were available during my care of the patient were reviewed by me and considered in my medical decision making (see chart for details).  Clinical Course   Patient presents with upper abdominal pain that started this evening.  VSS, NAD.  On exam, patient appeared uncomfortable and began vomiting.  On exam, heart RRR, lungs CTAB, abdomen soft with epigastric tenderness and mild rebound.  Lab work showed a leukocytosis, this is non-specific.  She also appeared dehydrated with an elevated BUN/Cr.  Bedside ultrasound performed by Dr. Laneta Simmers to  evaluate for cholelithiasis, this was negative.  CT was obtained and showed questionable pancreatic mass concerning for malignancy and recommend MRCP for further evaluation.  Patient feels improved with zofran.  She is receiving fluids.  Dilaudid for pain.  Will admit to medicine for further workup, appreciate Dr. Myna Hidalgo.  Case has been discussed with and seen by Dr. Laneta Simmers who agrees with the above plan for admission.   Final Clinical Impressions(s) / ED Diagnoses   Final diagnoses:  Pain of upper abdomen  Non-intractable vomiting with nausea, vomiting of unspecified type  Pancreatic mass    New Prescriptions New Prescriptions   No medications on file   I personally performed the services described in this documentation, which was scribed in my presence. The recorded information has been reviewed and is accurate.     Gloriann Loan, PA-C 11/09/15 0206    Leo Grosser, MD 11/09/15 2258

## 2015-11-09 NOTE — Progress Notes (Signed)
Tracy Williams is a 70 y.o. female with medical history significant for hypertension, type 2 diabetes mellitus, GERD, and osteoarthritis who presents the emergency department for evaluation of abdominal pain, nausea, and nonbloody nonbilious vomiting which developed shortly prior to arrival. She was admitted earlier this am, please see Dr Criss Rosales note in detail for H&P.  CT abd and pelvis is abnormal for a pancreatic mass.  MRCP ordered .  Her lactic acid normalized.  Would continue symptomatic management with IV fluids, IV antiemetics and pain control.  Depending on the MRCP results, plan to obtain IR consult for possible biopsy and an oncology consult will ensue.   Hosie Poisson, MD [phone]

## 2015-11-09 NOTE — ED Notes (Signed)
EDP at bedside  

## 2015-11-09 NOTE — Progress Notes (Signed)
Patient arrived to floor, room 1513. Attending on call paged to alert to patient arrival to floor.

## 2015-11-10 DIAGNOSIS — I10 Essential (primary) hypertension: Secondary | ICD-10-CM | POA: Diagnosis not present

## 2015-11-10 DIAGNOSIS — M1711 Unilateral primary osteoarthritis, right knee: Secondary | ICD-10-CM | POA: Diagnosis present

## 2015-11-10 DIAGNOSIS — R1084 Generalized abdominal pain: Secondary | ICD-10-CM | POA: Diagnosis not present

## 2015-11-10 DIAGNOSIS — E119 Type 2 diabetes mellitus without complications: Secondary | ICD-10-CM | POA: Diagnosis not present

## 2015-11-10 DIAGNOSIS — Z8249 Family history of ischemic heart disease and other diseases of the circulatory system: Secondary | ICD-10-CM | POA: Diagnosis not present

## 2015-11-10 DIAGNOSIS — Z888 Allergy status to other drugs, medicaments and biological substances status: Secondary | ICD-10-CM | POA: Diagnosis not present

## 2015-11-10 DIAGNOSIS — R1010 Upper abdominal pain, unspecified: Secondary | ICD-10-CM | POA: Diagnosis not present

## 2015-11-10 DIAGNOSIS — K859 Acute pancreatitis without necrosis or infection, unspecified: Secondary | ICD-10-CM | POA: Diagnosis present

## 2015-11-10 DIAGNOSIS — K869 Disease of pancreas, unspecified: Secondary | ICD-10-CM | POA: Diagnosis not present

## 2015-11-10 DIAGNOSIS — Z79899 Other long term (current) drug therapy: Secondary | ICD-10-CM | POA: Diagnosis not present

## 2015-11-10 DIAGNOSIS — Z833 Family history of diabetes mellitus: Secondary | ICD-10-CM | POA: Diagnosis not present

## 2015-11-10 DIAGNOSIS — K219 Gastro-esophageal reflux disease without esophagitis: Secondary | ICD-10-CM | POA: Diagnosis present

## 2015-11-10 DIAGNOSIS — Z885 Allergy status to narcotic agent status: Secondary | ICD-10-CM | POA: Diagnosis not present

## 2015-11-10 DIAGNOSIS — N179 Acute kidney failure, unspecified: Secondary | ICD-10-CM | POA: Diagnosis not present

## 2015-11-10 DIAGNOSIS — Z7952 Long term (current) use of systemic steroids: Secondary | ICD-10-CM | POA: Diagnosis not present

## 2015-11-10 DIAGNOSIS — T380X5A Adverse effect of glucocorticoids and synthetic analogues, initial encounter: Secondary | ICD-10-CM | POA: Diagnosis present

## 2015-11-10 DIAGNOSIS — Z803 Family history of malignant neoplasm of breast: Secondary | ICD-10-CM | POA: Diagnosis not present

## 2015-11-10 DIAGNOSIS — D72829 Elevated white blood cell count, unspecified: Secondary | ICD-10-CM | POA: Diagnosis present

## 2015-11-10 DIAGNOSIS — R7989 Other specified abnormal findings of blood chemistry: Secondary | ICD-10-CM | POA: Diagnosis present

## 2015-11-10 DIAGNOSIS — R935 Abnormal findings on diagnostic imaging of other abdominal regions, including retroperitoneum: Secondary | ICD-10-CM | POA: Diagnosis not present

## 2015-11-10 DIAGNOSIS — Z96651 Presence of right artificial knee joint: Secondary | ICD-10-CM | POA: Diagnosis present

## 2015-11-10 LAB — BASIC METABOLIC PANEL
ANION GAP: 5 (ref 5–15)
BUN: 22 mg/dL — ABNORMAL HIGH (ref 6–20)
CHLORIDE: 106 mmol/L (ref 101–111)
CO2: 27 mmol/L (ref 22–32)
CREATININE: 0.93 mg/dL (ref 0.44–1.00)
Calcium: 8.3 mg/dL — ABNORMAL LOW (ref 8.9–10.3)
GFR calc non Af Amer: >60 mL/min (ref >60)
GLUCOSE: 218 mg/dL — AB (ref 65–99)
Potassium: 4.4 mmol/L (ref 3.5–5.1)
Sodium: 138 mmol/L (ref 135–145)

## 2015-11-10 LAB — GLUCOSE, CAPILLARY
GLUCOSE-CAPILLARY: 167 mg/dL — AB (ref 65–99)
GLUCOSE-CAPILLARY: 165 mg/dL — AB (ref 65–99)
Glucose-Capillary: 204 mg/dL — ABNORMAL HIGH (ref 65–99)

## 2015-11-10 MED ORDER — HEPARIN SODIUM (PORCINE) 5000 UNIT/ML IJ SOLN
5000.0000 [IU] | Freq: Three times a day (TID) | INTRAMUSCULAR | Status: DC
Start: 2015-11-10 — End: 2015-11-11
  Administered 2015-11-11: 5000 [IU] via SUBCUTANEOUS
  Filled 2015-11-10: qty 1

## 2015-11-10 MED ORDER — SODIUM CHLORIDE 0.9 % IV SOLN
INTRAVENOUS | Status: DC
  Administered 2015-11-10: 13:00:00 via INTRAVENOUS

## 2015-11-10 MED ORDER — HEPARIN SODIUM (PORCINE) 1000 UNIT/ML IJ SOLN: 5000.0000 [IU] | Freq: Once | INTRAMUSCULAR | Status: DC

## 2015-11-10 NOTE — Progress Notes (Signed)
PROGRESS NOTE    Tracy Williams  [id number] DOB: [birth year] DOA: 11/09/2015 PCP: [name], MD    Brief Narrative:  Tracy Ritch Rhoadesis a 70 y.o.femalewith medical history significant for hypertension, type 2 diabetes mellitus, GERD, and osteoarthritis who presents the emergency department for evaluation of abdominal pain, nausea, and nonbloody nonbilious vomiting. She was admitted for pancreatitis.   Assessment & Plan:   Principal Problem:   Pancreatic mass Active Problems:   HTN (hypertension)   Diabetes mellitus type 2, controlled, without complications (HCC)   AKI (acute kidney injury) (HCC)   Leukocytosis   Emesis   Pain of upper abdomen   Pancreatic mass/ Acute Pancreatitis: On clear liquids, gentle hydration, repeat lipase in am.  Abnormal MRCP reviewed, GI consulted to evaluate for EUS biopsy.  Pain control, IV fluids.   Hypertension: Controlled.    Acute kidney INjury: Probably pre renal, resolved with hydration.    Diabetes Mellitus: CBG (last 3)   Recent Labs  11/10/15 0713 11/10/15 1133 11/10/15 1636  GLUCAP 204* 167* 165*    Resume SSI.   Elevated lactic acid: Resolved with hydraion.    Leukocytosis: Possibly reactive from mild pancreatitis.  Re check wbc count in am.      DVT prophylaxis: (sq heparin) Code Status: (Full/) Family Communication: none at bedside.  Disposition Plan: pending further eval.    Consultants:   GI consult.    Procedures: none  Antimicrobials: none  Subjective: Pain persistant.   Objective: Vitals:   11/09/15 1500 11/09/15 2030 11/10/15 0432 11/10/15 1518  BP: (!) 160/61 (!) 136/56 (!) 135/41 (!) 145/54  Pulse:  86 88 82  Resp: 18 18 16 20   Temp: 98.7 F (37.1 C) 98.7 F (37.1 C) 98.9 F (37.2 C) 98.9 F (37.2 C)  TempSrc: Oral Oral Oral Oral  SpO2: 98% 96% 94% 96%  Weight:      Height:        Intake/Output Summary (Last 24 hours) at 11/10/15 1832 Last data filed at  11/10/15 1500  Gross per 24 hour  Intake           858.75 ml  Output                0 ml  Net           858.75 ml   Filed Weights   11/08/15 2248  Weight: 78.5 kg (173 lb)    Examination:  General exam: Appears calm and comfortable  Respiratory system: Clear to auscultation. Respiratory effort normal. Cardiovascular system: S1 & S2 heard, RRR. No JVD, murmurs, rubs, gallops or clicks. No pedal edema. Gastrointestinal system: Abdomen is nondistended, soft mild generalized tenderness, bowel sounds heard.  Central nervous system: Alert and oriented. No focal neurological deficits. Extremities: Symmetric 5 x 5 power. Skin: No rashes, lesions or ulcers Psychiatry: Judgement and insight appear normal. Mood & affect appropriate.     Data Reviewed: I have personally reviewed following labs and imaging studies  CBC:  Recent Labs Lab 11/08/15 2340  WBC 20.6*  HGB 13.3  HCT 39.1  MCV 85.2  PLT [id number]   Basic Metabolic Panel:  Recent Labs Lab 11/08/15 2340 11/09/15 0632 11/10/15 0544  NA 135 136 138  K 4.5 4.8 4.4  CL 98* 104 106  CO2 27 27 27   GLUCOSE 245* 247* 218*  BUN 34* 24* 22*  CREATININE 1.41* 1.02* 0.93  CALCIUM 9.3 8.2* 8.3*   GFR: Estimated Creatinine Clearance: 59.1 mL/min (by C-G  formula based on SCr of 0.93 mg/dL). Liver Function Tests:  Recent Labs Lab 11/08/15 2340  AST 12*  ALT 16  ALKPHOS 99  BILITOT 1.0  PROT 7.1  ALBUMIN 4.3    Recent Labs Lab 11/08/15 2340  LIPASE 54*   No results for input(s): AMMONIA in the last 168 hours. Coagulation Profile: No results for input(s): INR, PROTIME in the last 168 hours. Cardiac Enzymes: No results for input(s): CKTOTAL, CKMB, CKMBINDEX, TROPONINI in the last 168 hours. BNP (last 3 results) No results for input(s): PROBNP in the last 8760 hours. HbA1C: No results for input(s): HGBA1C in the last 72 hours. CBG:  Recent Labs Lab 11/09/15 1723 11/09/15 2034 11/10/15 0713 11/10/15 1133  11/10/15 1636  GLUCAP 275* 218* 204* 167* 165*   Lipid Profile: No results for input(s): CHOL, HDL, LDLCALC, TRIG, CHOLHDL, LDLDIRECT in the last 72 hours. Thyroid Function Tests: No results for input(s): TSH, T4TOTAL, FREET4, T3FREE, THYROIDAB in the last 72 hours. Anemia Panel: No results for input(s): VITAMINB12, FOLATE, FERRITIN, TIBC, IRON, RETICCTPCT in the last 72 hours. Sepsis Labs:  Recent Labs Lab 11/09/15 [id number] 11/09/15 0930  LATICACIDVEN 2.0* 1.1    No results found for this or any previous visit (from the past 240 hour(s)).       Radiology Studies: Dg Chest 2 View  Result Date: 11/08/2015 CLINICAL DATA:  Left-sided chest pain EXAM: CHEST  2 VIEW COMPARISON:  06/22/2012 FINDINGS: Flat opacity at the left base has an atelectatic appearance. There is no edema, consolidation, effusion, or pneumothorax. Normal heart size and mediastinal contours. No visible osseous abnormality. IMPRESSION: Mild left basilar atelectasis. Electronically Signed   By: Monte Fantasia M.D.   On: 11/08/2015 23:30   Ct Abdomen Pelvis W Contrast  Result Date: 11/09/2015 CLINICAL DATA:  Acute onset of upper abdominal pain, radiating to the chest and left shoulder. Nausea. Initial encounter. EXAM: CT ABDOMEN AND PELVIS WITH CONTRAST TECHNIQUE: Multidetector CT imaging of the abdomen and pelvis was performed using the standard protocol following bolus administration of intravenous contrast. CONTRAST:  2mL ISOVUE-300 IOPAMIDOL (ISOVUE-300) INJECTION 61% COMPARISON:  None. FINDINGS: Bibasilar atelectasis or scarring is noted. A small hiatal hernia noted. The liver and spleen are unremarkable in appearance. The gallbladder is within normal limits. The adrenal glands are grossly unremarkable in appearance. There is a large minimally heterogeneous mass noted at the distal body of the pancreas, measuring 4.7 x 3.7 x 4.0 cm, with minimal surrounding soft tissue inflammation. There is suggestion of surrounding  angiogenesis. Nonspecific perinephric stranding is noted bilaterally. There is no evidence of hydronephrosis. No renal or ureteral stones are identified. No free fluid is identified. The small bowel is unremarkable in appearance. The stomach is within normal limits. No acute vascular abnormalities are seen. Scattered calcification is noted along the abdominal aorta and its branches. The appendix is normal in caliber and no definite evidence of appendicitis. The colon is grossly unremarkable in appearance. The bladder is mildly distended and grossly unremarkable. The uterus contains a fibroid, measuring 3.4 cm. The ovaries are grossly symmetric. No suspicious adnexal masses are seen. No inguinal lymphadenopathy is seen. No acute osseous abnormalities are identified. Vacuum phenomenon is noted at L5-S1. IMPRESSION: 1. Large minimally heterogeneous mass at the distal body of the pancreas, measuring 4.7 x 3.7 x 4.0 cm, with minimal surrounding soft tissue inflammation. Suggestion of surrounding angiogenesis. This is concerning for primary pancreatic malignancy. Would correlate with LFTs, and recommend MRCP for further evaluation. 2. Scattered calcification  along the abdominal aorta and its branches. 3. Uterine fibroid noted. 4. Bibasilar atelectasis or scarring seen. 5. Small hiatal hernia noted. These results were called by telephone at the time of interpretation on 11/09/2015 at 1:34 am to Dr. Leo Grosser, who verbally acknowledged these results. Electronically Signed   By: Garald Balding M.D.   On: 11/09/2015 01:36   Mr Abdomen Mrcp Wo Cm  Result Date: 11/09/2015 CLINICAL DATA:  Pancreatic mass on CT. EXAM: MRI ABDOMEN WITHOUT CONTRAST  (INCLUDING MRCP) TECHNIQUE: Multiplanar multisequence MR imaging of the abdomen was performed. Heavily T2-weighted images of the biliary and pancreatic ducts were obtained, and three-dimensional MRCP images were rendered by post processing. COMPARISON:  CT of earlier in the day.  FINDINGS: Lower chest: Bibasilar atelectasis. Normal heart size without pericardial or pleural effusion. Small hiatal hernia. Hepatobiliary: Normal noncontrast appearance of the liver. Normal gallbladder. No biliary duct dilatation or evidence of choledocholithiasis. Pancreas: Normal appearance of the pancreatic uncinate process, head. Within the pancreatic body, corresponding to the CT abnormality, is a 4.5 x 3.0 x 3.6 cm mass. This is markedly T2 hyperintense/cystic. Areas of heterogeneous signal within are indicative of complexity. Intimately associated with the greater curvature of the stomach without fistulous communication identified. There is atrophy of the upstream pancreatic tail. Spleen: Normal in size, without focal abnormality. Adrenals/Urinary Tract: Normal adrenal glands. Normal left kidney. A right renal interpolar 10 mm T1 hyperintense lesion on image 72/series 1400. No hydronephrosis. Stomach/Bowel: The remainder of the stomach is unremarkable. There is minimal and edema/fluid adjacent the greater curvature of the stomach, including image 18/series 4. Normal caliber of abdominal large and small bowel loops. Vascular/Lymphatic: Normal caliber of the aorta and branch vessels. No retroperitoneal or retrocrural adenopathy. Other: Trace fluid is also identified adjacent the spleen. Musculoskeletal: No acute osseous abnormality. IMPRESSION: 1. Complex cystic pancreatic body lesion, incompletely characterized on this noncontrast exam. Favor complex pseudocyst. Cystic neoplasm, including mucinous serous cystadenocarcinoma or oligocystic serous cystadenoma could look similar. Consider sampling via endoscopic ultrasound. 2. The cystic lesion is intimately associated with the a greater curvature of the stomach, without gross fistulous communication identified. There is also edema adjacent the greater curvature of the stomach. Recommend clinical exclusion of mild superimposed acute pancreatitis. 3. Trace  abdominal ascites. 4. Tiny hiatal hernia. 5. Right renal 1 cm lesion is technically indeterminate but most likely a hemorrhagic/proteinaceous cyst. Consider renal ultrasound follow-up at 1 year to confirm stability Electronically Signed   By: Abigail Miyamoto M.D.   On: 11/09/2015 15:46   Mr 3d Recon At Scanner  Result Date: 11/09/2015 CLINICAL DATA:  Pancreatic mass on CT. EXAM: MRI ABDOMEN WITHOUT CONTRAST  (INCLUDING MRCP) TECHNIQUE: Multiplanar multisequence MR imaging of the abdomen was performed. Heavily T2-weighted images of the biliary and pancreatic ducts were obtained, and three-dimensional MRCP images were rendered by post processing. COMPARISON:  CT of earlier in the day. FINDINGS: Lower chest: Bibasilar atelectasis. Normal heart size without pericardial or pleural effusion. Small hiatal hernia. Hepatobiliary: Normal noncontrast appearance of the liver. Normal gallbladder. No biliary duct dilatation or evidence of choledocholithiasis. Pancreas: Normal appearance of the pancreatic uncinate process, head. Within the pancreatic body, corresponding to the CT abnormality, is a 4.5 x 3.0 x 3.6 cm mass. This is markedly T2 hyperintense/cystic. Areas of heterogeneous signal within are indicative of complexity. Intimately associated with the greater curvature of the stomach without fistulous communication identified. There is atrophy of the upstream pancreatic tail. Spleen: Normal in size, without focal abnormality. Adrenals/Urinary  Tract: Normal adrenal glands. Normal left kidney. A right renal interpolar 10 mm T1 hyperintense lesion on image 72/series 1400. No hydronephrosis. Stomach/Bowel: The remainder of the stomach is unremarkable. There is minimal and edema/fluid adjacent the greater curvature of the stomach, including image 18/series 4. Normal caliber of abdominal large and small bowel loops. Vascular/Lymphatic: Normal caliber of the aorta and branch vessels. No retroperitoneal or retrocrural adenopathy.  Other: Trace fluid is also identified adjacent the spleen. Musculoskeletal: No acute osseous abnormality. IMPRESSION: 1. Complex cystic pancreatic body lesion, incompletely characterized on this noncontrast exam. Favor complex pseudocyst. Cystic neoplasm, including mucinous serous cystadenocarcinoma or oligocystic serous cystadenoma could look similar. Consider sampling via endoscopic ultrasound. 2. The cystic lesion is intimately associated with the a greater curvature of the stomach, without gross fistulous communication identified. There is also edema adjacent the greater curvature of the stomach. Recommend clinical exclusion of mild superimposed acute pancreatitis. 3. Trace abdominal ascites. 4. Tiny hiatal hernia. 5. Right renal 1 cm lesion is technically indeterminate but most likely a hemorrhagic/proteinaceous cyst. Consider renal ultrasound follow-up at 1 year to confirm stability Electronically Signed   By: Abigail Miyamoto M.D.   On: 11/09/2015 15:46        Scheduled Meds: . famotidine  20 mg Oral BID  . heparin  5,000 Units Subcutaneous Q8H  .  HYDROmorphone (DILAUDID) injection  1 mg Intravenous Once  . insulin aspart  0-9 Units Subcutaneous TID WC  . triamcinolone ointment  1 application Topical BID   Continuous Infusions: . sodium chloride 75 mL/hr at 11/10/15 1309     LOS: 0 days    Time spent: 25 minutes.    Hosie Poisson, MD [facility] Pager [phone]  If 7PM-7AM, please contact night-coverage [email or web address] Password [facility] 11/10/2015, 6:32 PM

## 2015-11-10 NOTE — Progress Notes (Signed)
GI has seen the patient will likely perform EUS for FNA.  We will not plan for biopsy at this time.  If GI is unable to obtain sample, please reorder if felt appropriate.    Tracy Williams

## 2015-11-10 NOTE — Progress Notes (Signed)
   11/10/15 1200  Clinical Encounter Type  Visited With Patient not available  Visit Type Initial;Psychological support;Spiritual support  Referral From Nurse  Consult/Referral To Chaplain  Spiritual Encounters  Spiritual Needs Other (Comment) (Not reviewed)  Stress Factors  Patient Stress Factors Not reviewed   I went to visit the patient per Spiritual Care consult. The doctor was getting ready to visit the patient, so Spiritual Care will follow-up at a later time.  Please, contact Spiritual Care for further assistance.   [place] M.Div.

## 2015-11-10 NOTE — Consult Note (Signed)
[facility] CONSULT Reason for consult: pancreatic cystic mass Referring Physician: [facility]. PCP:Dr Birdie Riddle. Primary G.I.: Dr. [name] is an 70 y.o. female.  HPI: she was admitted to the hospital after sudden onset of nausea vomiting an upper abdominal pain. This occurred Saturday 2 days ago. It occurred in the evening. Importantly, she's had no chronic pain and felt fine all day Saturday until sudden onset of epigastric pain nausea and vomiting. The pain went to her left shoulder. She presented to the emergency room and the feeling was that she may have had a gallbladder attack in ultrasound limited to the gallbladder was done in the emergency room showing a normal gallbladder with no gallstones in a normal CBD. This led to CT scan which showed what was felt to be a mass in the tale of the pancreas that was cystic in nature and MRI and MRCP showed no gallstones and what was felt to be a cystic mass in the tale of the pancreas. This was approximately 4 cm in size. This was close to an contiguous to the greater curve of the stomach. The patient is a nondrinker. She is feeling a little bit better. Since there is no history of drinking there was concern that this could be a pancreatic cystic neoplasm. The patient's lipase was slightly elevated 54 with normal albumin and LFTs were basically normal. She does have a history of Gerd for which she takes Zantac and a history of type II diabetes. Colonoscopies are up-to-date was performed in 2012 by Dr. Watt Climes with a hyperplastic polyp removed 10 year repeat recommend Past Medical History:  Diagnosis Date  . Allergy   . Arthritis   . Diabetes mellitus without complication ([place])   . Dysrhythmia    Hx: of palpitations in 2010 only  . GERD (gastroesophageal reflux disease)   . Hypertension   . PONV (postoperative nausea and vomiting)   . Right knee DJD     Past Surgical History:  Procedure Laterality Date  . CESAREAN  SECTION  1975  . COLONOSCOPY W/ BIOPSIES AND POLYPECTOMY     Hx: of in 2012  . DILATION AND CURETTAGE OF UTERUS     Hx: of 1978  . JOINT REPLACEMENT     Hx: of left Knee 2010  . TOTAL KNEE ARTHROPLASTY  2010  . TOTAL KNEE ARTHROPLASTY Right 06/26/2012   Dr Noemi Chapel  . TOTAL KNEE ARTHROPLASTY Right 06/26/2012   Procedure: TOTAL KNEE ARTHROPLASTY- right;  Surgeon: Lorn Junes, MD;  Location: [place];  Service: Orthopedics;  Laterality: Right;    Family History  Problem Relation Age of Onset  . Diabetes Mellitus II Father   . Hypertension Father   . Cancer - Other Mother     Breast    Social History:  reports that she has never smoked. She has never used smokeless tobacco. She reports that she does not drink alcohol or use drugs.  Allergies:  Allergies  Allergen Reactions  . Metformin And Related Nausea And Vomiting  . Darvocet [Propoxyphene N-Acetaminophen] Nausea And Vomiting  . Demerol [Meperidine] Nausea And Vomiting  . Hydrochlorothiazide Nausea And Vomiting  . Percocet [Oxycodone-Acetaminophen] Nausea And Vomiting  . Vicodin [Hydrocodone-Acetaminophen] Nausea And Vomiting    Medications; Prior to Admission medications   Medication Sig Start Date End Date Taking? Authorizing Provider  atorvastatin (LIPITOR) 20 MG tablet TAKE 1 TABLET (20 MG TOTAL) BY MOUTH AT BEDTIME. 10/27/15  Yes Midge Minium, MD  lisinopril (PRINIVIL,ZESTRIL)  10 MG tablet TAKE 1 TABLET (10 MG TOTAL) BY MOUTH DAILY. 02/20/15  Yes Sheliah Hatch, MD  predniSONE (DELTASONE) 10 MG tablet 3 tabs x3 days and then 2 tabs x3 days and then 1 tab x3 days.  Take w/ food. 10/31/15  Yes Sheliah Hatch, MD  ranitidine (ZANTAC) 150 MG tablet Take 150 mg by mouth 2 (two) times daily as needed for heartburn.   Yes Historical Provider, MD  sitaGLIPtin (JANUVIA) 100 MG tablet Take 1 tablet (100 mg total) by mouth daily. 08/15/15  Yes Sheliah Hatch, MD  triamcinolone ointment (KENALOG) 0.1 % Apply 1  application topically 2 (two) times daily. 10/31/15 10/30/16 Yes Sheliah Hatch, MD   . famotidine  20 mg Oral BID  . heparin  5,000 Units Subcutaneous Q8H  .  HYDROmorphone (DILAUDID) injection  1 mg Intravenous Once  . insulin aspart  0-9 Units Subcutaneous TID WC  . triamcinolone ointment  1 application Topical BID   PRN Meds hydrALAZINE, ketorolac, morphine injection Results for orders placed or performed during the hospital encounter of 11/09/15 (from the past 48 hour(s))  Lipase, blood     Status: Abnormal   Collection Time: 11/08/15 11:40 PM  Result Value Ref Range   Lipase 54 (H) 11 - 51 U/L  Comprehensive metabolic panel     Status: Abnormal   Collection Time: 11/08/15 11:40 PM  Result Value Ref Range   Sodium 135 135 - 145 mmol/L   Potassium 4.5 3.5 - 5.1 mmol/L   Chloride 98 (L) 101 - 111 mmol/L   CO2 27 22 - 32 mmol/L   Glucose, Bld 245 (H) 65 - 99 mg/dL   BUN 34 (H) 6 - 20 mg/dL   Creatinine, Ser 1.36 (H) 0.44 - 1.00 mg/dL   Calcium 9.3 8.9 - 25.2 mg/dL   Total Protein 7.1 6.5 - 8.1 g/dL   Albumin 4.3 3.5 - 5.0 g/dL   AST 12 (L) 15 - 41 U/L   ALT 16 14 - 54 U/L   Alkaline Phosphatase 99 38 - 126 U/L   Total Bilirubin 1.0 0.3 - 1.2 mg/dL   GFR calc non Af Amer 37 (L) >60 mL/min   GFR calc Af Amer 43 (L) >60 mL/min    Comment: (NOTE) The eGFR has been calculated using the CKD EPI equation. This calculation has not been validated in all clinical situations. eGFR's persistently <60 mL/min signify possible Chronic Kidney Disease.    Anion gap 10 5 - 15  CBC     Status: Abnormal   Collection Time: 11/08/15 11:40 PM  Result Value Ref Range   WBC 20.6 (H) 4.0 - 10.5 K/uL   RBC 4.59 3.87 - 5.11 MIL/uL   Hemoglobin 13.3 12.0 - 15.0 g/dL   HCT 26.1 67.4 - 62.8 %   MCV 85.2 78.0 - 100.0 fL   MCH 29.0 26.0 - 34.0 pg   MCHC 34.0 30.0 - 36.0 g/dL   RDW 28.6 68.9 - 37.5 %   Platelets 251 150 - 400 K/uL  I-stat troponin, ED     Status: None   Collection Time:  11/08/15 11:48 PM  Result Value Ref Range   Troponin i, poc 0.01 0.00 - 0.08 ng/mL   Comment 3            Comment: Due to the release kinetics of cTnI, a negative result within the first hours of the onset of symptoms does not rule out myocardial infarction with certainty.  If myocardial infarction is still suspected, repeat the test at appropriate intervals.   Basic metabolic panel     Status: Abnormal   Collection Time: 11/09/15  6:32 AM  Result Value Ref Range   Sodium 136 135 - 145 mmol/L   Potassium 4.8 3.5 - 5.1 mmol/L   Chloride 104 101 - 111 mmol/L   CO2 27 22 - 32 mmol/L   Glucose, Bld 247 (H) 65 - 99 mg/dL   BUN 24 (H) 6 - 20 mg/dL   Creatinine, Ser 1.02 (H) 0.44 - 1.00 mg/dL   Calcium 8.2 (L) 8.9 - 10.3 mg/dL   GFR calc non Af Amer 55 (L) >60 mL/min   GFR calc Af Amer >60 >60 mL/min    Comment: (NOTE) The eGFR has been calculated using the CKD EPI equation. This calculation has not been validated in all clinical situations. eGFR's persistently <60 mL/min signify possible Chronic Kidney Disease.    Anion gap 5 5 - 15  Lactic acid, plasma     Status: Abnormal   Collection Time: 11/09/15  6:32 AM  Result Value Ref Range   Lactic Acid, Venous 2.0 (HH) 0.5 - 1.9 mmol/L    Comment: CRITICAL RESULT CALLED TO, READ BACK BY AND VERIFIED WITH: WILLARD,W RN [phone] [id number] COVINGTON,N   Glucose, capillary     Status: Abnormal   Collection Time: 11/09/15  7:33 AM  Result Value Ref Range   Glucose-Capillary 252 (H) 65 - 99 mg/dL   Comment 1 Notify RN   Lactic acid, plasma     Status: None   Collection Time: 11/09/15  9:30 AM  Result Value Ref Range   Lactic Acid, Venous 1.1 0.5 - 1.9 mmol/L  Glucose, capillary     Status: Abnormal   Collection Time: 11/09/15  1:52 PM  Result Value Ref Range   Glucose-Capillary 142 (H) 65 - 99 mg/dL  Glucose, capillary     Status: Abnormal   Collection Time: 11/09/15  5:23 PM  Result Value Ref Range   Glucose-Capillary 275 (H) 65 - 99  mg/dL   Comment 1 Notify RN   Glucose, capillary     Status: Abnormal   Collection Time: 11/09/15  8:34 PM  Result Value Ref Range   Glucose-Capillary 218 (H) 65 - 99 mg/dL  Basic metabolic panel     Status: Abnormal   Collection Time: 11/10/15  5:44 AM  Result Value Ref Range   Sodium 138 135 - 145 mmol/L   Potassium 4.4 3.5 - 5.1 mmol/L   Chloride 106 101 - 111 mmol/L   CO2 27 22 - 32 mmol/L   Glucose, Bld 218 (H) 65 - 99 mg/dL   BUN 22 (H) 6 - 20 mg/dL   Creatinine, Ser 0.93 0.44 - 1.00 mg/dL   Calcium 8.3 (L) 8.9 - 10.3 mg/dL   GFR calc non Af Amer >60 >60 mL/min   GFR calc Af Amer >60 >60 mL/min    Comment: (NOTE) The eGFR has been calculated using the CKD EPI equation. This calculation has not been validated in all clinical situations. eGFR's persistently <60 mL/min signify possible Chronic Kidney Disease.    Anion gap 5 5 - 15  Glucose, capillary     Status: Abnormal   Collection Time: 11/10/15  7:13 AM  Result Value Ref Range   Glucose-Capillary 204 (H) 65 - 99 mg/dL  Glucose, capillary     Status: Abnormal   Collection Time: 11/10/15 11:33 AM  Result Value Ref  Range   Glucose-Capillary 167 (H) 65 - 99 mg/dL    Dg Chest 2 View  Result Date: 11/08/2015 CLINICAL DATA:  Left-sided chest pain EXAM: CHEST  2 VIEW COMPARISON:  06/22/2012 FINDINGS: Flat opacity at the left base has an atelectatic appearance. There is no edema, consolidation, effusion, or pneumothorax. Normal heart size and mediastinal contours. No visible osseous abnormality. IMPRESSION: Mild left basilar atelectasis. Electronically Signed   By: Monte Fantasia M.D.   On: 11/08/2015 23:30   Ct Abdomen Pelvis W Contrast  Result Date: 11/09/2015 CLINICAL DATA:  Acute onset of upper abdominal pain, radiating to the chest and left shoulder. Nausea. Initial encounter. EXAM: CT ABDOMEN AND PELVIS WITH CONTRAST TECHNIQUE: Multidetector CT imaging of the abdomen and pelvis was performed using the standard protocol  following bolus administration of intravenous contrast. CONTRAST:  3m ISOVUE-300 IOPAMIDOL (ISOVUE-300) INJECTION 61% COMPARISON:  None. FINDINGS: Bibasilar atelectasis or scarring is noted. A small hiatal hernia noted. The liver and spleen are unremarkable in appearance. The gallbladder is within normal limits. The adrenal glands are grossly unremarkable in appearance. There is a large minimally heterogeneous mass noted at the distal body of the pancreas, measuring 4.7 x 3.7 x 4.0 cm, with minimal surrounding soft tissue inflammation. There is suggestion of surrounding angiogenesis. Nonspecific perinephric stranding is noted bilaterally. There is no evidence of hydronephrosis. No renal or ureteral stones are identified. No free fluid is identified. The small bowel is unremarkable in appearance. The stomach is within normal limits. No acute vascular abnormalities are seen. Scattered calcification is noted along the abdominal aorta and its branches. The appendix is normal in caliber and no definite evidence of appendicitis. The colon is grossly unremarkable in appearance. The bladder is mildly distended and grossly unremarkable. The uterus contains a fibroid, measuring 3.4 cm. The ovaries are grossly symmetric. No suspicious adnexal masses are seen. No inguinal lymphadenopathy is seen. No acute osseous abnormalities are identified. Vacuum phenomenon is noted at L5-S1. IMPRESSION: 1. Large minimally heterogeneous mass at the distal body of the pancreas, measuring 4.7 x 3.7 x 4.0 cm, with minimal surrounding soft tissue inflammation. Suggestion of surrounding angiogenesis. This is concerning for primary pancreatic malignancy. Would correlate with LFTs, and recommend MRCP for further evaluation. 2. Scattered calcification along the abdominal aorta and its branches. 3. Uterine fibroid noted. 4. Bibasilar atelectasis or scarring seen. 5. Small hiatal hernia noted. These results were called by telephone at the time of  interpretation on 11/09/2015 at 1:34 am to Dr. DLeo Grosser who verbally acknowledged these results. Electronically Signed   By: JGarald BaldingM.D.   On: 11/09/2015 01:36   Mr Abdomen Mrcp Wo Cm  Result Date: 11/09/2015 CLINICAL DATA:  Pancreatic mass on CT. EXAM: MRI ABDOMEN WITHOUT CONTRAST  (INCLUDING MRCP) TECHNIQUE: Multiplanar multisequence MR imaging of the abdomen was performed. Heavily T2-weighted images of the biliary and pancreatic ducts were obtained, and three-dimensional MRCP images were rendered by post processing. COMPARISON:  CT of earlier in the day. FINDINGS: Lower chest: Bibasilar atelectasis. Normal heart size without pericardial or pleural effusion. Small hiatal hernia. Hepatobiliary: Normal noncontrast appearance of the liver. Normal gallbladder. No biliary duct dilatation or evidence of choledocholithiasis. Pancreas: Normal appearance of the pancreatic uncinate process, head. Within the pancreatic body, corresponding to the CT abnormality, is a 4.5 x 3.0 x 3.6 cm mass. This is markedly T2 hyperintense/cystic. Areas of heterogeneous signal within are indicative of complexity. Intimately associated with the greater curvature of the stomach without fistulous communication  identified. There is atrophy of the upstream pancreatic tail. Spleen: Normal in size, without focal abnormality. Adrenals/Urinary Tract: Normal adrenal glands. Normal left kidney. A right renal interpolar 10 mm T1 hyperintense lesion on image 72/series 1400. No hydronephrosis. Stomach/Bowel: The remainder of the stomach is unremarkable. There is minimal and edema/fluid adjacent the greater curvature of the stomach, including image 18/series 4. Normal caliber of abdominal large and small bowel loops. Vascular/Lymphatic: Normal caliber of the aorta and branch vessels. No retroperitoneal or retrocrural adenopathy. Other: Trace fluid is also identified adjacent the spleen. Musculoskeletal: No acute osseous abnormality.  IMPRESSION: 1. Complex cystic pancreatic body lesion, incompletely characterized on this noncontrast exam. Favor complex pseudocyst. Cystic neoplasm, including mucinous serous cystadenocarcinoma or oligocystic serous cystadenoma could look similar. Consider sampling via endoscopic ultrasound. 2. The cystic lesion is intimately associated with the a greater curvature of the stomach, without gross fistulous communication identified. There is also edema adjacent the greater curvature of the stomach. Recommend clinical exclusion of mild superimposed acute pancreatitis. 3. Trace abdominal ascites. 4. Tiny hiatal hernia. 5. Right renal 1 cm lesion is technically indeterminate but most likely a hemorrhagic/proteinaceous cyst. Consider renal ultrasound follow-up at 1 year to confirm stability Electronically Signed   By: Abigail Miyamoto M.D.   On: 11/09/2015 15:46   Mr 3d Recon At Scanner  Result Date: 11/09/2015 CLINICAL DATA:  Pancreatic mass on CT. EXAM: MRI ABDOMEN WITHOUT CONTRAST  (INCLUDING MRCP) TECHNIQUE: Multiplanar multisequence MR imaging of the abdomen was performed. Heavily T2-weighted images of the biliary and pancreatic ducts were obtained, and three-dimensional MRCP images were rendered by post processing. COMPARISON:  CT of earlier in the day. FINDINGS: Lower chest: Bibasilar atelectasis. Normal heart size without pericardial or pleural effusion. Small hiatal hernia. Hepatobiliary: Normal noncontrast appearance of the liver. Normal gallbladder. No biliary duct dilatation or evidence of choledocholithiasis. Pancreas: Normal appearance of the pancreatic uncinate process, head. Within the pancreatic body, corresponding to the CT abnormality, is a 4.5 x 3.0 x 3.6 cm mass. This is markedly T2 hyperintense/cystic. Areas of heterogeneous signal within are indicative of complexity. Intimately associated with the greater curvature of the stomach without fistulous communication identified. There is atrophy of the  upstream pancreatic tail. Spleen: Normal in size, without focal abnormality. Adrenals/Urinary Tract: Normal adrenal glands. Normal left kidney. A right renal interpolar 10 mm T1 hyperintense lesion on image 72/series 1400. No hydronephrosis. Stomach/Bowel: The remainder of the stomach is unremarkable. There is minimal and edema/fluid adjacent the greater curvature of the stomach, including image 18/series 4. Normal caliber of abdominal large and small bowel loops. Vascular/Lymphatic: Normal caliber of the aorta and branch vessels. No retroperitoneal or retrocrural adenopathy. Other: Trace fluid is also identified adjacent the spleen. Musculoskeletal: No acute osseous abnormality. IMPRESSION: 1. Complex cystic pancreatic body lesion, incompletely characterized on this noncontrast exam. Favor complex pseudocyst. Cystic neoplasm, including mucinous serous cystadenocarcinoma or oligocystic serous cystadenoma could look similar. Consider sampling via endoscopic ultrasound. 2. The cystic lesion is intimately associated with the a greater curvature of the stomach, without gross fistulous communication identified. There is also edema adjacent the greater curvature of the stomach. Recommend clinical exclusion of mild superimposed acute pancreatitis. 3. Trace abdominal ascites. 4. Tiny hiatal hernia. 5. Right renal 1 cm lesion is technically indeterminate but most likely a hemorrhagic/proteinaceous cyst. Consider renal ultrasound follow-up at 1 year to confirm stability Electronically Signed   By: Abigail Miyamoto M.D.   On: 11/09/2015 15:46  Blood pressure (!) 135/41, pulse 88, temperature 98.9 F (37.2 C), temperature source Oral, resp. rate 16, height '5\' 5"'$  (1.651 m), weight 78.5 kg (173 lb), SpO2 94 %.  Physical exam:   General-- pleasant white female in no acute distress ENT-- nonicteric Neck-- without lymphadenopathy Heart-- regular rate and rhythm without murmurs are gallops Lungs--  clear Abdomen-- nondistended with bowel sounds present mild epigastric tenderness Psych-- alert and oriented answers questions appropriately   Assessment: 1. Cystic lesion of the tale of the pancreas. Patient is nondrinker. Her ultrasound in the emergency room in the MRCP did not show any evidence of biliary disease. It is unusual that she had sudden onset of this type pain. She could well have passed a gallstone. The other possibility is that this could be a neoplastic cystic lesion of the pancreas. I have reviewed the films with radiology and this is contiguous with the greater curve of the stomach and would likely be amenable to fine needle aspiration via EUS.  Plan: 1. Would continue to support her symptoms currently. I will discuss with a physician who does perform EUS and have him look at her films to determine if this lesion will be amenable to fine needle aspiration and we can make further recommendations. Have discussed all this in detail with the patient.   Ronald Vinsant JR,Tremond Shimabukuro L 11/10/2015, 1:27 PM   This note was created using voice recognition software and minor errors may Have occurred unintentionally. Pager: [phone] If no answer or after hours call [phone]

## 2015-11-11 DIAGNOSIS — K869 Disease of pancreas, unspecified: Secondary | ICD-10-CM

## 2015-11-11 LAB — CBC
HCT: 33.2 % — ABNORMAL LOW (ref 36.0–46.0)
Hemoglobin: 10.7 g/dL — ABNORMAL LOW (ref 12.0–15.0)
MCH: 28.8 pg (ref 26.0–34.0)
MCHC: 32.2 g/dL (ref 30.0–36.0)
MCV: 89.2 fL (ref 78.0–100.0)
PLATELETS: 192 10*3/uL (ref 150–400)
RBC: 3.72 MIL/uL — AB (ref 3.87–5.11)
RDW: 13.8 % (ref 11.5–15.5)
WBC: 9.1 10*3/uL (ref 4.0–10.5)

## 2015-11-11 LAB — COMPREHENSIVE METABOLIC PANEL
ALBUMIN: 3 g/dL — AB (ref 3.5–5.0)
ALK PHOS: 71 U/L (ref 38–126)
ALT: 12 U/L — AB (ref 14–54)
ANION GAP: 4 — AB (ref 5–15)
AST: 9 U/L — AB (ref 15–41)
BUN: 15 mg/dL (ref 6–20)
CALCIUM: 8.3 mg/dL — AB (ref 8.9–10.3)
CO2: 26 mmol/L (ref 22–32)
Chloride: 105 mmol/L (ref 101–111)
Creatinine, Ser: 0.83 mg/dL (ref 0.44–1.00)
GFR calc Af Amer: >60 mL/min (ref >60)
GFR calc non Af Amer: >60 mL/min (ref >60)
GLUCOSE: 173 mg/dL — AB (ref 65–99)
Potassium: 4.2 mmol/L (ref 3.5–5.1)
SODIUM: 135 mmol/L (ref 135–145)
Total Bilirubin: 1.3 mg/dL — ABNORMAL HIGH (ref 0.3–1.2)
Total Protein: 5.8 g/dL — ABNORMAL LOW (ref 6.5–8.1)

## 2015-11-11 LAB — GLUCOSE, CAPILLARY
GLUCOSE-CAPILLARY: 129 mg/dL — AB (ref 65–99)
Glucose-Capillary: 178 mg/dL — ABNORMAL HIGH (ref 65–99)

## 2015-11-11 LAB — LIPASE, BLOOD: Lipase: 51 U/L (ref 11–51)

## 2015-11-11 MED ORDER — MELOXICAM 7.5 MG PO TABS: 7.5000 mg | ORAL_TABLET | Freq: Every day | ORAL | 0 refills | Status: DC

## 2015-11-11 NOTE — Progress Notes (Signed)
[facility] PROGRESS NOTE Subjective patient continues to feel better. Have reviewed film with Dr. Paulita Fujita. Cystic lesion the pancreas would be amenable to FNA. The schedule is filled up for this week but Dr. Paulita Fujita will be able to do early next week. Hopefully patient will continue to improve and can go home and have procedure scheduled his outpatient. She reports that she feels much better.  Objective: Vital signs in last 24 hours: Temp:  [98.2 F (36.8 C)-98.9 F (37.2 C)] 98.5 F (36.9 C) (08/22 0605) Pulse Rate:  [82-84] 84 (08/22 0605) Resp:  [16-20] 16 (08/22 0605) BP: (131-151)/(54-65) 151/65 (08/22 0605) SpO2:  [95 %-96 %] 96 % (08/22 0605) Last BM Date: 11/08/15  Intake/Output from previous day: 08/21 0701 - 08/22 0700 In: 618.8 [P.O.:480; I.V.:138.8] Out: -  Intake/Output this shift: No intake/output data recorded.  PE: General-- watching television and no acute distress  Abdomen-- completely soft and nontender  Lab Results:  Recent Labs  11/08/15 2340 11/11/15 0553  WBC 20.6* 9.1  HGB 13.3 10.7*  HCT 39.1 33.2*  PLT 251 192   BMET  Recent Labs  11/08/15 2340 11/09/15 0632 11/10/15 0544 11/11/15 0553  NA 135 136 138 135  K 4.5 4.8 4.4 4.2  CL 98* 104 106 105  CO2 27 27 27 26   CREATININE 1.41* 1.02* 0.93 0.83   LFT  Recent Labs  11/08/15 2340 11/11/15 0553  PROT 7.1 5.8*  AST 12* 9*  ALT 16 12*  ALKPHOS 99 71  BILITOT 1.0 1.3*   PT/INR No results for input(s): LABPROT, INR in the last 72 hours. PANCREAS  Recent Labs  11/08/15 2340 11/11/15 0553  LIPASE 54* 51         Studies/Results: Mr Abdomen Mrcp Wo Cm  Result Date: 11/09/2015 CLINICAL DATA:  Pancreatic mass on CT. EXAM: MRI ABDOMEN WITHOUT CONTRAST  (INCLUDING MRCP) TECHNIQUE: Multiplanar multisequence MR imaging of the abdomen was performed. Heavily T2-weighted images of the biliary and pancreatic ducts were obtained, and three-dimensional MRCP images were  rendered by post processing. COMPARISON:  CT of earlier in the day. FINDINGS: Lower chest: Bibasilar atelectasis. Normal heart size without pericardial or pleural effusion. Small hiatal hernia. Hepatobiliary: Normal noncontrast appearance of the liver. Normal gallbladder. No biliary duct dilatation or evidence of choledocholithiasis. Pancreas: Normal appearance of the pancreatic uncinate process, head. Within the pancreatic body, corresponding to the CT abnormality, is a 4.5 x 3.0 x 3.6 cm mass. This is markedly T2 hyperintense/cystic. Areas of heterogeneous signal within are indicative of complexity. Intimately associated with the greater curvature of the stomach without fistulous communication identified. There is atrophy of the upstream pancreatic tail. Spleen: Normal in size, without focal abnormality. Adrenals/Urinary Tract: Normal adrenal glands. Normal left kidney. A right renal interpolar 10 mm T1 hyperintense lesion on image 72/series 1400. No hydronephrosis. Stomach/Bowel: The remainder of the stomach is unremarkable. There is minimal and edema/fluid adjacent the greater curvature of the stomach, including image 18/series 4. Normal caliber of abdominal large and small bowel loops. Vascular/Lymphatic: Normal caliber of the aorta and branch vessels. No retroperitoneal or retrocrural adenopathy. Other: Trace fluid is also identified adjacent the spleen. Musculoskeletal: No acute osseous abnormality. IMPRESSION: 1. Complex cystic pancreatic body lesion, incompletely characterized on this noncontrast exam. Favor complex pseudocyst. Cystic neoplasm, including mucinous serous cystadenocarcinoma or oligocystic serous cystadenoma could look similar. Consider sampling via endoscopic ultrasound. 2. The cystic lesion is intimately associated with the a greater curvature of the stomach, without gross fistulous  communication identified. There is also edema adjacent the greater curvature of the stomach. Recommend  clinical exclusion of mild superimposed acute pancreatitis. 3. Trace abdominal ascites. 4. Tiny hiatal hernia. 5. Right renal 1 cm lesion is technically indeterminate but most likely a hemorrhagic/proteinaceous cyst. Consider renal ultrasound follow-up at 1 year to confirm stability Electronically Signed   By: Abigail Miyamoto M.D.   On: 11/09/2015 15:46   Mr 3d Recon At Scanner  Result Date: 11/09/2015 CLINICAL DATA:  Pancreatic mass on CT. EXAM: MRI ABDOMEN WITHOUT CONTRAST  (INCLUDING MRCP) TECHNIQUE: Multiplanar multisequence MR imaging of the abdomen was performed. Heavily T2-weighted images of the biliary and pancreatic ducts were obtained, and three-dimensional MRCP images were rendered by post processing. COMPARISON:  CT of earlier in the day. FINDINGS: Lower chest: Bibasilar atelectasis. Normal heart size without pericardial or pleural effusion. Small hiatal hernia. Hepatobiliary: Normal noncontrast appearance of the liver. Normal gallbladder. No biliary duct dilatation or evidence of choledocholithiasis. Pancreas: Normal appearance of the pancreatic uncinate process, head. Within the pancreatic body, corresponding to the CT abnormality, is a 4.5 x 3.0 x 3.6 cm mass. This is markedly T2 hyperintense/cystic. Areas of heterogeneous signal within are indicative of complexity. Intimately associated with the greater curvature of the stomach without fistulous communication identified. There is atrophy of the upstream pancreatic tail. Spleen: Normal in size, without focal abnormality. Adrenals/Urinary Tract: Normal adrenal glands. Normal left kidney. A right renal interpolar 10 mm T1 hyperintense lesion on image 72/series 1400. No hydronephrosis. Stomach/Bowel: The remainder of the stomach is unremarkable. There is minimal and edema/fluid adjacent the greater curvature of the stomach, including image 18/series 4. Normal caliber of abdominal large and small bowel loops. Vascular/Lymphatic: Normal caliber of the  aorta and branch vessels. No retroperitoneal or retrocrural adenopathy. Other: Trace fluid is also identified adjacent the spleen. Musculoskeletal: No acute osseous abnormality. IMPRESSION: 1. Complex cystic pancreatic body lesion, incompletely characterized on this noncontrast exam. Favor complex pseudocyst. Cystic neoplasm, including mucinous serous cystadenocarcinoma or oligocystic serous cystadenoma could look similar. Consider sampling via endoscopic ultrasound. 2. The cystic lesion is intimately associated with the a greater curvature of the stomach, without gross fistulous communication identified. There is also edema adjacent the greater curvature of the stomach. Recommend clinical exclusion of mild superimposed acute pancreatitis. 3. Trace abdominal ascites. 4. Tiny hiatal hernia. 5. Right renal 1 cm lesion is technically indeterminate but most likely a hemorrhagic/proteinaceous cyst. Consider renal ultrasound follow-up at 1 year to confirm stability Electronically Signed   By: Abigail Miyamoto M.D.   On: 11/09/2015 15:46    Medications: I have reviewed the patient's current medications.  Assessment/Plan: 1. Cystic lesion pancreatic tail. This is amenable to FNA and drainage trans gastric via EUS. Have discussed in detail patient. Have explained Dr. Paulita Fujita will be able to schedule on many different days next week and if patient continues to improve she could go home and arrange to have this done as an outpatient. I've gone ahead and increased her diet to a low-fat diet and we will see how she does.   Tesslyn Baumert JR,Jen Benedict L 11/11/2015, 12:29 PM  This note was created using voice recognition software. Minor errors may Have occurred unintentionally.  Pager: [phone] If no answer or after hours call [phone]

## 2015-11-11 NOTE — Progress Notes (Signed)
Went over d/c instructions with patient.  She verbalized understanding.  She left hospital via personal vehicle with all belongings.

## 2015-11-11 NOTE — Discharge Summary (Signed)
Physician Discharge Summary  Tracy Williams [id number] DOB: 09-Feb-1946 DOA: 11/09/2015  PCP: Tracy Asa, MD  Admit date: 11/09/2015 Discharge date: 11/11/2015  Time spent: > 35 minutes  Recommendations for Outpatient Follow-up:  1. Monitor blood glucose levels 2. Ensure patient f/u with GI for evaluation of pancreatic mass  Discharge Diagnoses:  Principal Problem:   Pancreatic mass Active Problems:   HTN (hypertension)   Diabetes mellitus type 2, controlled, without complications (HCC)   AKI (acute kidney injury) ([place])   Leukocytosis   Emesis   Pain of upper abdomen   Discharge Condition: stable  Diet recommendation: heart healthy  Filed Weights   11/08/15 2248  Weight: 78.5 kg (173 lb)    History of present illness:  70 y.o. female with medical history significant for hypertension, type 2 diabetes mellitus, GERD, and osteoarthritis who presents the emergency department for evaluation of abdominal pain, nausea, and nonbloody nonbilious vomiting which developed shortly prior to arrival. Patient reports pain in her usual state of health until the rather sudden onset of pain across the upper abdomen radiating towards the left shoulder and associated with nausea and vomiting  Hospital Course:  Abdominal pain - resolving. Will d/c on meloxicam as patient was on toradol in house - no reports of nausea as such will not d/c on antiemetics  Pancreatic mass - GI to evaluate further as outpatient. - Gi consulted and mentioned the following: Cystic lesion pancreatic tail. This is amenable to FNA and drainage trans gastric via EUS. Have discussed in detail patient. Have explained Dr. Paulita Fujita will be able to schedule on many different days next week and if patient continues to improve she could go home and arrange to have this done as an outpatient.  Procedures:  None  Consultations:  GI  Discharge Exam: Vitals:   11/10/15 2104 11/11/15 0605  BP: (!) 131/59 (!)  151/65  Pulse: 84 84  Resp: 16 16  Temp: 98.2 F (36.8 C) 98.5 F (36.9 C)    General: pt in nad, alert and awake Cardiovascular: rrr, no rubs  Respiratory: no increased wob, no wheezes  Discharge Instructions   Discharge Instructions    Call MD for:  difficulty breathing, headache or visual disturbances    Complete by:  As directed   Call MD for:  severe uncontrolled pain    Complete by:  As directed   Call MD for:  temperature >100.4    Complete by:  As directed   Diet - low sodium heart healthy    Complete by:  As directed   Discharge instructions    Complete by:  As directed   Please be sure to follow up with your primary care physician in 1-2 weeks or sooner should any new concerns arise.   Increase activity slowly    Complete by:  As directed     Current Discharge Medication List    START taking these medications   Details  meloxicam (MOBIC) 7.5 MG tablet Take 1 tablet (7.5 mg total) by mouth daily. Qty: 15 tablet, Refills: 0      CONTINUE these medications which have NOT CHANGED   Details  atorvastatin (LIPITOR) 20 MG tablet TAKE 1 TABLET (20 MG TOTAL) BY MOUTH AT BEDTIME. Qty: 90 tablet, Refills: 1    lisinopril (PRINIVIL,ZESTRIL) 10 MG tablet TAKE 1 TABLET (10 MG TOTAL) BY MOUTH DAILY. Qty: 90 tablet, Refills: 1    ranitidine (ZANTAC) 150 MG tablet Take 150 mg by mouth 2 (two) times daily  as needed for heartburn.    sitaGLIPtin (JANUVIA) 100 MG tablet Take 1 tablet (100 mg total) by mouth daily. Qty: 30 tablet, Refills: 6    triamcinolone ointment (KENALOG) 0.1 % Apply 1 application topically 2 (two) times daily. Qty: 80 g, Refills: 1      STOP taking these medications     predniSONE (DELTASONE) 10 MG tablet        Allergies  Allergen Reactions  . Metformin And Related Nausea And Vomiting  . Darvocet [Propoxyphene N-Acetaminophen] Nausea And Vomiting  . Demerol [Meperidine] Nausea And Vomiting  . Hydrochlorothiazide Nausea And Vomiting  .  Percocet [Oxycodone-Acetaminophen] Nausea And Vomiting  . Vicodin [Hydrocodone-Acetaminophen] Nausea And Vomiting      The results of significant diagnostics from this hospitalization (including imaging, microbiology, ancillary and laboratory) are listed below for reference.    Significant Diagnostic Studies: Dg Chest 2 View  Result Date: 11/08/2015 CLINICAL DATA:  Left-sided chest pain EXAM: CHEST  2 VIEW COMPARISON:  06/22/2012 FINDINGS: Flat opacity at the left base has an atelectatic appearance. There is no edema, consolidation, effusion, or pneumothorax. Normal heart size and mediastinal contours. No visible osseous abnormality. IMPRESSION: Mild left basilar atelectasis. Electronically Signed   By: Monte Fantasia M.D.   On: 11/08/2015 23:30   Ct Abdomen Pelvis W Contrast  Result Date: 11/09/2015 CLINICAL DATA:  Acute onset of upper abdominal pain, radiating to the chest and left shoulder. Nausea. Initial encounter. EXAM: CT ABDOMEN AND PELVIS WITH CONTRAST TECHNIQUE: Multidetector CT imaging of the abdomen and pelvis was performed using the standard protocol following bolus administration of intravenous contrast. CONTRAST:  86mL ISOVUE-300 IOPAMIDOL (ISOVUE-300) INJECTION 61% COMPARISON:  None. FINDINGS: Bibasilar atelectasis or scarring is noted. A small hiatal hernia noted. The liver and spleen are unremarkable in appearance. The gallbladder is within normal limits. The adrenal glands are grossly unremarkable in appearance. There is a large minimally heterogeneous mass noted at the distal body of the pancreas, measuring 4.7 x 3.7 x 4.0 cm, with minimal surrounding soft tissue inflammation. There is suggestion of surrounding angiogenesis. Nonspecific perinephric stranding is noted bilaterally. There is no evidence of hydronephrosis. No renal or ureteral stones are identified. No free fluid is identified. The small bowel is unremarkable in appearance. The stomach is within normal limits. No  acute vascular abnormalities are seen. Scattered calcification is noted along the abdominal aorta and its branches. The appendix is normal in caliber and no definite evidence of appendicitis. The colon is grossly unremarkable in appearance. The bladder is mildly distended and grossly unremarkable. The uterus contains a fibroid, measuring 3.4 cm. The ovaries are grossly symmetric. No suspicious adnexal masses are seen. No inguinal lymphadenopathy is seen. No acute osseous abnormalities are identified. Vacuum phenomenon is noted at L5-S1. IMPRESSION: 1. Large minimally heterogeneous mass at the distal body of the pancreas, measuring 4.7 x 3.7 x 4.0 cm, with minimal surrounding soft tissue inflammation. Suggestion of surrounding angiogenesis. This is concerning for primary pancreatic malignancy. Would correlate with LFTs, and recommend MRCP for further evaluation. 2. Scattered calcification along the abdominal aorta and its branches. 3. Uterine fibroid noted. 4. Bibasilar atelectasis or scarring seen. 5. Small hiatal hernia noted. These results were called by telephone at the time of interpretation on 11/09/2015 at 1:34 am to Dr. Leo Grosser, who verbally acknowledged these results. Electronically Signed   By: Garald Balding M.D.   On: 11/09/2015 01:36   Mr Abdomen Mrcp Wo Cm  Result Date: 11/09/2015 CLINICAL DATA:  Pancreatic mass on CT. EXAM: MRI ABDOMEN WITHOUT CONTRAST  (INCLUDING MRCP) TECHNIQUE: Multiplanar multisequence MR imaging of the abdomen was performed. Heavily T2-weighted images of the biliary and pancreatic ducts were obtained, and three-dimensional MRCP images were rendered by post processing. COMPARISON:  CT of earlier in the day. FINDINGS: Lower chest: Bibasilar atelectasis. Normal heart size without pericardial or pleural effusion. Small hiatal hernia. Hepatobiliary: Normal noncontrast appearance of the liver. Normal gallbladder. No biliary duct dilatation or evidence of choledocholithiasis.  Pancreas: Normal appearance of the pancreatic uncinate process, head. Within the pancreatic body, corresponding to the CT abnormality, is a 4.5 x 3.0 x 3.6 cm mass. This is markedly T2 hyperintense/cystic. Areas of heterogeneous signal within are indicative of complexity. Intimately associated with the greater curvature of the stomach without fistulous communication identified. There is atrophy of the upstream pancreatic tail. Spleen: Normal in size, without focal abnormality. Adrenals/Urinary Tract: Normal adrenal glands. Normal left kidney. A right renal interpolar 10 mm T1 hyperintense lesion on image 72/series 1400. No hydronephrosis. Stomach/Bowel: The remainder of the stomach is unremarkable. There is minimal and edema/fluid adjacent the greater curvature of the stomach, including image 18/series 4. Normal caliber of abdominal large and small bowel loops. Vascular/Lymphatic: Normal caliber of the aorta and branch vessels. No retroperitoneal or retrocrural adenopathy. Other: Trace fluid is also identified adjacent the spleen. Musculoskeletal: No acute osseous abnormality. IMPRESSION: 1. Complex cystic pancreatic body lesion, incompletely characterized on this noncontrast exam. Favor complex pseudocyst. Cystic neoplasm, including mucinous serous cystadenocarcinoma or oligocystic serous cystadenoma could look similar. Consider sampling via endoscopic ultrasound. 2. The cystic lesion is intimately associated with the a greater curvature of the stomach, without gross fistulous communication identified. There is also edema adjacent the greater curvature of the stomach. Recommend clinical exclusion of mild superimposed acute pancreatitis. 3. Trace abdominal ascites. 4. Tiny hiatal hernia. 5. Right renal 1 cm lesion is technically indeterminate but most likely a hemorrhagic/proteinaceous cyst. Consider renal ultrasound follow-up at 1 year to confirm stability Electronically Signed   By: Abigail Miyamoto M.D.   On:  11/09/2015 15:46   Mr 3d Recon At Scanner  Result Date: 11/09/2015 CLINICAL DATA:  Pancreatic mass on CT. EXAM: MRI ABDOMEN WITHOUT CONTRAST  (INCLUDING MRCP) TECHNIQUE: Multiplanar multisequence MR imaging of the abdomen was performed. Heavily T2-weighted images of the biliary and pancreatic ducts were obtained, and three-dimensional MRCP images were rendered by post processing. COMPARISON:  CT of earlier in the day. FINDINGS: Lower chest: Bibasilar atelectasis. Normal heart size without pericardial or pleural effusion. Small hiatal hernia. Hepatobiliary: Normal noncontrast appearance of the liver. Normal gallbladder. No biliary duct dilatation or evidence of choledocholithiasis. Pancreas: Normal appearance of the pancreatic uncinate process, head. Within the pancreatic body, corresponding to the CT abnormality, is a 4.5 x 3.0 x 3.6 cm mass. This is markedly T2 hyperintense/cystic. Areas of heterogeneous signal within are indicative of complexity. Intimately associated with the greater curvature of the stomach without fistulous communication identified. There is atrophy of the upstream pancreatic tail. Spleen: Normal in size, without focal abnormality. Adrenals/Urinary Tract: Normal adrenal glands. Normal left kidney. A right renal interpolar 10 mm T1 hyperintense lesion on image 72/series 1400. No hydronephrosis. Stomach/Bowel: The remainder of the stomach is unremarkable. There is minimal and edema/fluid adjacent the greater curvature of the stomach, including image 18/series 4. Normal caliber of abdominal large and small bowel loops. Vascular/Lymphatic: Normal caliber of the aorta and branch vessels. No retroperitoneal or retrocrural adenopathy. Other: Trace fluid is also identified  adjacent the spleen. Musculoskeletal: No acute osseous abnormality. IMPRESSION: 1. Complex cystic pancreatic body lesion, incompletely characterized on this noncontrast exam. Favor complex pseudocyst. Cystic neoplasm, including  mucinous serous cystadenocarcinoma or oligocystic serous cystadenoma could look similar. Consider sampling via endoscopic ultrasound. 2. The cystic lesion is intimately associated with the a greater curvature of the stomach, without gross fistulous communication identified. There is also edema adjacent the greater curvature of the stomach. Recommend clinical exclusion of mild superimposed acute pancreatitis. 3. Trace abdominal ascites. 4. Tiny hiatal hernia. 5. Right renal 1 cm lesion is technically indeterminate but most likely a hemorrhagic/proteinaceous cyst. Consider renal ultrasound follow-up at 1 year to confirm stability Electronically Signed   By: Abigail Miyamoto M.D.   On: 11/09/2015 15:46    Microbiology: No results found for this or any previous visit (from the past 240 hour(s)).   Labs: Basic Metabolic Panel:  Recent Labs Lab 11/08/15 2340 11/09/15 0632 11/10/15 0544 11/11/15 0553  NA 135 136 138 135  K 4.5 4.8 4.4 4.2  CL 98* 104 106 105  CO2 27 27 27 26   GLUCOSE 245* 247* 218* 173*  BUN 34* 24* 22* 15  CREATININE 1.41* 1.02* 0.93 0.83  CALCIUM 9.3 8.2* 8.3* 8.3*   Liver Function Tests:  Recent Labs Lab 11/08/15 2340 11/11/15 0553  AST 12* 9*  ALT 16 12*  ALKPHOS 99 71  BILITOT 1.0 1.3*  PROT 7.1 5.8*  ALBUMIN 4.3 3.0*    Recent Labs Lab 11/08/15 2340 11/11/15 0553  LIPASE 54* 51   No results for input(s): AMMONIA in the last 168 hours. CBC:  Recent Labs Lab 11/08/15 2340 11/11/15 0553  WBC 20.6* 9.1  HGB 13.3 10.7*  HCT 39.1 33.2*  MCV 85.2 89.2  PLT 251 192   Cardiac Enzymes: No results for input(s): CKTOTAL, CKMB, CKMBINDEX, TROPONINI in the last 168 hours. BNP: BNP (last 3 results) No results for input(s): BNP in the last 8760 hours.  ProBNP (last 3 results) No results for input(s): PROBNP in the last 8760 hours.  CBG:  Recent Labs Lab 11/10/15 0713 11/10/15 1133 11/10/15 1636 11/11/15 0748 11/11/15 1209  GLUCAP 204* 167* 165*  178* 129*    Signed:  Velvet Bathe MD.  [facility] 11/11/2015, 3:14 PM

## 2015-11-12 ENCOUNTER — Telehealth: Payer: Self-pay | Admitting: *Deleted

## 2015-11-12 NOTE — Telephone Encounter (Signed)
Transition Care Management Follow-up Telephone Call  Per Discharge Summary:  PCP: [name], MD  Admit date: 11/09/2015 Discharge date: 11/11/2015  Recommendations for Outpatient Follow-up:  1. Monitor blood glucose levels 2. Ensure patient f/u with GI for evaluation of pancreatic mass  Discharge Diagnoses:  Principal Problem:   Pancreatic mass Active Problems:   HTN (hypertension)   Diabetes mellitus type 2, controlled, without complications (HCC)   AKI (acute kidney injury) ([place])   Leukocytosis   Emesis   Pain of upper abdomen   Discharge Condition: stable  Diet recommendation: heart healthy  --   How have you been since you were released from the hospital? "I felt really well. I've had a couple of sharp unusual pains that concerned me, so I laid down and slept and now I feel better."   Do you understand why you were in the hospital? yes   Do you understand the discharge instructions? yes   Where were you discharged to? Home   Items Reviewed:  Medications reviewed: yes  Allergies reviewed: yes  Dietary changes reviewed: yes, low sodium heart healthy  Referrals reviewed: yes, outpatient follow-up w/ GI   Functional Questionnaire:   Activities of Daily Living (ADLs):   She states they are independent in the following: ambulation, bathing and hygiene, feeding, continence, grooming, toileting and dressing States they require assistance with the following: none   Any transportation issues/concerns?: no   Any patient concerns? Yes, pt still has poison ivy rash on one arm. Kenalog cream is helping somewhat, but she is still itchy. She completed prednisone as directed, but did miss the very last dose due to being admitted. She has not heard from GI to schedule outpatient follow-up and is not sure who she is supposed to schedule with. Per chart review, looks like Dr. Paulita Fujita was recommended. Pt will call to set up follow-up prior to appt w/ PCP if  she does not hear from them.   Confirmed importance and date/time of follow-up visits scheduled yes  Provider Appointment booked with Dr. [name] 11/14/15 @ 11am  Confirmed with patient if condition begins to worsen call PCP or go to the ER.  Patient was given the office number and encouraged to call back with question or concerns.  : yes

## 2015-11-14 ENCOUNTER — Encounter: Payer: Self-pay | Admitting: Family Medicine

## 2015-11-14 ENCOUNTER — Ambulatory Visit (INDEPENDENT_AMBULATORY_CARE_PROVIDER_SITE_OTHER): Payer: Medicare Other | Admitting: Family Medicine

## 2015-11-14 VITALS — BP 130/81 | HR 79 | Temp 98.0°F | Resp 16 | Ht 65.0 in | Wt 172.0 lb

## 2015-11-14 DIAGNOSIS — B029 Zoster without complications: Secondary | ICD-10-CM

## 2015-11-14 DIAGNOSIS — K8689 Other specified diseases of pancreas: Secondary | ICD-10-CM

## 2015-11-14 DIAGNOSIS — K869 Disease of pancreas, unspecified: Secondary | ICD-10-CM

## 2015-11-14 MED ORDER — HYDROXYZINE HCL 50 MG PO TABS: ORAL_TABLET | ORAL | 0 refills | Status: DC

## 2015-11-14 NOTE — Assessment & Plan Note (Signed)
New.  Pt's dermatomal rash on L arm is now consistent w/ shingles rather than contact dermatitis that healed w/ steroid taper.  Too late to start Valtrex.  Will start hydroxyzine as needed for itching.  Reviewed supportive care and red flags that should prompt return.  Pt expressed understanding and is in agreement w/ plan.

## 2015-11-14 NOTE — Progress Notes (Signed)
   Subjective:    Patient ID: Tracy Williams, female    DOB: [birth year], 70 y.o.   MRN: [id number]  HPI Hospital f/u- pt was hospitalized 8/20-8/22 for abd pain.  Was found to have pancreatic mass on CT and MRI.  Pt has f/u w/ Dr Paulita Fujita on Tuesday.  Plan is for endoscopic bx.  Pt is no longer having epigastric pain.  No N/V since Saturday.  No fevers or unexplained weight loss.  L arm lesions- pt reports R arm has healed completely after the prednisone.  Continues to have R arm burning and intense itching.  Area is in linear pattern- appears to be dermatomal.  sxs have been present x3 weeks.   Review of Systems For ROS see HPI     Objective:   Physical Exam  Constitutional: She is oriented to person, place, and time. She appears well-developed and well-nourished. No distress.  HENT:  Head: Normocephalic and atraumatic.  Eyes: Conjunctivae and EOM are normal. Pupils are equal, round, and reactive to light.  Neck: Normal range of motion. Neck supple. No thyromegaly present.  Cardiovascular: Normal rate, regular rhythm, normal heart sounds and intact distal pulses.   No murmur heard. Pulmonary/Chest: Effort normal and breath sounds normal. No respiratory distress.  Abdominal: Soft. She exhibits no distension. There is no tenderness.  Musculoskeletal: She exhibits no edema.  Lymphadenopathy:    She has no cervical adenopathy.  Neurological: She is alert and oriented to person, place, and time.  Skin: Skin is warm and dry. Rash (vesicular rash in dermatomal pattern on L arm) noted. There is erythema.  Psychiatric: She has a normal mood and affect. Her behavior is normal.  Vitals reviewed.         Assessment & Plan:

## 2015-11-14 NOTE — Patient Instructions (Signed)
Follow up as scheduled/needed Start the Hydroxyzine as needed for itching Apply cool wash cloths to the shingles Use the Triamcinolone ointment on the belly rash Call with any questions or concerns I'll be praying for a cyst and good news!!! Hang in there!!!

## 2015-11-14 NOTE — Assessment & Plan Note (Signed)
New to provider, noted during recent hospitalization for abd pain.  Pt has GI f/u scheduled for next week and she is to have bx at that time.  Currently pain free.  No N/V.  Pt is aware that there is a chance of malignancy.  Will follow closely.

## 2015-11-14 NOTE — Progress Notes (Signed)
Pre visit review using our clinic review tool, if applicable. No additional management support is needed unless otherwise documented below in the visit note. 

## 2015-11-17 ENCOUNTER — Telehealth: Payer: Self-pay | Admitting: Family Medicine

## 2015-11-17 NOTE — Telephone Encounter (Signed)
Husband calling asking if he could get shingles from his wife. He called on Friday and lv vm.

## 2015-11-18 DIAGNOSIS — K869 Disease of pancreas, unspecified: Secondary | ICD-10-CM | POA: Diagnosis not present

## 2015-11-18 NOTE — Telephone Encounter (Signed)
Assuming pt has already had chicken pox, he cannot get shingles from his wife.  Please apologize for the 4+ day delay in our response!

## 2015-11-18 NOTE — Telephone Encounter (Signed)
Apologized profusely to pt for not responding sooner. He accepted apology and PCP advice.

## 2015-12-07 ENCOUNTER — Other Ambulatory Visit: Payer: Self-pay | Admitting: Family Medicine

## 2015-12-13 ENCOUNTER — Other Ambulatory Visit: Payer: Self-pay | Admitting: Family Medicine

## 2015-12-15 ENCOUNTER — Ambulatory Visit (INDEPENDENT_AMBULATORY_CARE_PROVIDER_SITE_OTHER): Payer: Medicare Other | Admitting: Family Medicine

## 2015-12-15 ENCOUNTER — Encounter: Payer: Self-pay | Admitting: Family Medicine

## 2015-12-15 VITALS — BP 118/60 | HR 72 | Temp 98.0°F | Resp 16 | Ht 65.0 in | Wt 170.4 lb

## 2015-12-15 DIAGNOSIS — I10 Essential (primary) hypertension: Secondary | ICD-10-CM | POA: Diagnosis not present

## 2015-12-15 DIAGNOSIS — E785 Hyperlipidemia, unspecified: Secondary | ICD-10-CM

## 2015-12-15 DIAGNOSIS — R1010 Upper abdominal pain, unspecified: Secondary | ICD-10-CM

## 2015-12-15 DIAGNOSIS — E119 Type 2 diabetes mellitus without complications: Secondary | ICD-10-CM | POA: Diagnosis not present

## 2015-12-15 LAB — BASIC METABOLIC PANEL
BUN: 23 mg/dL (ref 6–23)
CALCIUM: 8.9 mg/dL (ref 8.4–10.5)
CO2: 31 meq/L (ref 19–32)
Chloride: 102 mEq/L (ref 96–112)
Creatinine, Ser: 0.97 mg/dL (ref 0.40–1.20)
GFR: 60.34 mL/min (ref >60.00)
GLUCOSE: 165 mg/dL — AB (ref 70–99)
Potassium: 4.6 mEq/L (ref 3.5–5.1)
SODIUM: 139 meq/L (ref 135–145)

## 2015-12-15 LAB — CBC WITH DIFFERENTIAL/PLATELET
BASOS ABS: 0 10*3/uL (ref 0.0–0.1)
Basophils Relative: 0.3 % (ref 0.0–3.0)
EOS ABS: 0.2 10*3/uL (ref 0.0–0.7)
Eosinophils Relative: 3.2 % (ref 0.0–5.0)
HCT: 36.6 % (ref 36.0–46.0)
Hemoglobin: 12.3 g/dL (ref 12.0–15.0)
LYMPHS ABS: 1.1 10*3/uL (ref 0.7–4.0)
LYMPHS PCT: 16.9 % (ref 12.0–46.0)
MCHC: 33.7 g/dL (ref 30.0–36.0)
MCV: 84.2 fl (ref 78.0–100.0)
Monocytes Absolute: 0.5 10*3/uL (ref 0.1–1.0)
Monocytes Relative: 7.1 % (ref 3.0–12.0)
NEUTROS ABS: 4.6 10*3/uL (ref 1.4–7.7)
NEUTROS PCT: 72.5 % (ref 43.0–77.0)
PLATELETS: 213 10*3/uL (ref 150.0–400.0)
RBC: 4.34 Mil/uL (ref 3.87–5.11)
RDW: 13.5 % (ref 11.5–15.5)
WBC: 6.4 10*3/uL (ref 4.0–10.5)

## 2015-12-15 LAB — HEPATIC FUNCTION PANEL
ALBUMIN: 3.9 g/dL (ref 3.5–5.2)
ALK PHOS: 89 U/L (ref 39–117)
ALT: 10 U/L (ref 0–35)
AST: 9 U/L (ref 0–37)
Bilirubin, Direct: 0.1 mg/dL (ref 0.0–0.3)
TOTAL PROTEIN: 6.4 g/dL (ref 6.0–8.3)
Total Bilirubin: 0.8 mg/dL (ref 0.2–1.2)

## 2015-12-15 LAB — LIPID PANEL
CHOLESTEROL: 278 mg/dL — AB (ref 0–200)
HDL: 37.8 mg/dL — AB (ref >39.00)
NonHDL: 240.5
Total CHOL/HDL Ratio: 7
Triglycerides: 210 mg/dL — ABNORMAL HIGH (ref 0.0–149.0)
VLDL: 42 mg/dL — ABNORMAL HIGH (ref 0.0–40.0)

## 2015-12-15 LAB — AMYLASE: AMYLASE: 70 U/L (ref 27–131)

## 2015-12-15 LAB — LIPASE: LIPASE: 33 U/L (ref 11.0–59.0)

## 2015-12-15 LAB — LDL CHOLESTEROL, DIRECT: LDL DIRECT: 188 mg/dL

## 2015-12-15 LAB — HEMOGLOBIN A1C: Hgb A1c MFr Bld: 8.5 % — ABNORMAL HIGH (ref 4.6–6.5)

## 2015-12-15 NOTE — Assessment & Plan Note (Signed)
Chronic problem.  Pt's last A1C was very high but she had stopped all of her medications b/c she was feeling well.  Stressed need for low carb diet and regular activity.  UTD on foot exam, eye exam.  On ACE for renal protection.  Check labs.  Adjust meds prn

## 2015-12-15 NOTE — Assessment & Plan Note (Signed)
Chronic problem.  Excellent control today.  Asymptomatic w/ exception of abdominal pain.  Check labs.  No anticipated med changes.  Will continue to follow.

## 2015-12-15 NOTE — Assessment & Plan Note (Signed)
Chronic problem, tolerating statin w/o difficulty.  Check labs.  Adjust meds prn  

## 2015-12-15 NOTE — Patient Instructions (Signed)
Follow up in 3-4 months to recheck diabetes We'll notify you of your lab results and make any changes if needed If the abdominal pain continues, please call GI and make them aware Continue to eat a low carb diet and exercise as able Drink plenty of fluids Call with any questions or concerns Hang in there!!!

## 2015-12-15 NOTE — Progress Notes (Signed)
   Subjective:    Patient ID: Tracy Williams, female    DOB: 1946-01-22, 70 y.o.   MRN: [id number]  HPI DM- chronic problem, on Januvia w/o difficulty.  UTD on foot exam, eye exam.  On ACE for renal protection.  Denies CP, SOB, HAs, visual changes, edema.  No symptomatic lows.  No numbness/tingling of hands/feet.  Hyperlipidemia- chronic problem, on Lipitor.  No abd pain or N/V until 2 days ago  HTN- chronic problem, on Lisinopril w/ excellent control.  Denies CP, SOB, HAs, visual changes, edema.  Epigastric pain- sxs returned 2 days ago.  Similar to the pain that sent her to hospital this summer but not as intense.     Review of Systems For ROS see HPI     Objective:   Physical Exam  Constitutional: She is oriented to person, place, and time. She appears well-developed and well-nourished. No distress.  HENT:  Head: Normocephalic and atraumatic.  Eyes: Conjunctivae and EOM are normal. Pupils are equal, round, and reactive to light.  Neck: Normal range of motion. Neck supple. No thyromegaly present.  Cardiovascular: Normal rate, regular rhythm, normal heart sounds and intact distal pulses.   No murmur heard. Pulmonary/Chest: Effort normal and breath sounds normal. No respiratory distress.  Abdominal: Soft. She exhibits no distension. There is tenderness (TTP over epigastrum).  Musculoskeletal: She exhibits no edema.  Lymphadenopathy:    She has no cervical adenopathy.  Neurological: She is alert and oriented to person, place, and time.  Skin: Skin is warm and dry.  Psychiatric: She has a normal mood and affect. Her behavior is normal.  Vitals reviewed.         Assessment & Plan:

## 2015-12-15 NOTE — Assessment & Plan Note (Signed)
Recurrent problem for pt.  She has ongoing evaluation for pancreatic mass.  Check amylase, lipase.  Encouraged her to call GI regarding her recurrent pain.  Will follow closely.

## 2015-12-15 NOTE — Progress Notes (Signed)
Pre visit review using our clinic review tool, if applicable. No additional management support is needed unless otherwise documented below in the visit note. 

## 2015-12-16 ENCOUNTER — Other Ambulatory Visit: Payer: Self-pay | Admitting: Gastroenterology

## 2015-12-16 ENCOUNTER — Other Ambulatory Visit: Payer: Self-pay | Admitting: General Practice

## 2015-12-16 MED ORDER — EMPAGLIFLOZIN 10 MG PO TABS: 10.0000 mg | ORAL_TABLET | Freq: Every day | ORAL | 6 refills | Status: DC

## 2015-12-17 ENCOUNTER — Other Ambulatory Visit: Payer: Self-pay | Admitting: General Practice

## 2015-12-17 ENCOUNTER — Encounter: Payer: Self-pay | Admitting: Family Medicine

## 2015-12-17 MED ORDER — GLIPIZIDE ER 5 MG PO TB24: 5.0000 mg | ORAL_TABLET | Freq: Every day | ORAL | 6 refills | Status: DC

## 2015-12-18 ENCOUNTER — Encounter (HOSPITAL_COMMUNITY): Payer: Self-pay | Admitting: *Deleted

## 2015-12-19 ENCOUNTER — Other Ambulatory Visit: Payer: Self-pay | Admitting: Gastroenterology

## 2015-12-24 ENCOUNTER — Ambulatory Visit (HOSPITAL_COMMUNITY): Payer: Medicare Other | Admitting: Certified Registered"

## 2015-12-24 ENCOUNTER — Encounter (HOSPITAL_COMMUNITY): Payer: Self-pay

## 2015-12-24 ENCOUNTER — Encounter (HOSPITAL_COMMUNITY)
Admission: RE | Disposition: A | Discharge status: Home / Self Care | Payer: Self-pay | Source: Ambulatory Visit | Attending: Gastroenterology

## 2015-12-24 ENCOUNTER — Ambulatory Visit (HOSPITAL_COMMUNITY)
Admission: RE | Admit: 2015-12-24 | Discharge: 2015-12-24 | Disposition: A | Discharge status: Home / Self Care | Payer: Medicare Other | Source: Ambulatory Visit | Attending: Gastroenterology | Admitting: Gastroenterology

## 2015-12-24 DIAGNOSIS — Z7984 Long term (current) use of oral hypoglycemic drugs: Secondary | ICD-10-CM | POA: Diagnosis not present

## 2015-12-24 DIAGNOSIS — E7800 Pure hypercholesterolemia, unspecified: Secondary | ICD-10-CM | POA: Diagnosis not present

## 2015-12-24 DIAGNOSIS — K862 Cyst of pancreas: Secondary | ICD-10-CM | POA: Diagnosis not present

## 2015-12-24 DIAGNOSIS — Z79899 Other long term (current) drug therapy: Secondary | ICD-10-CM | POA: Diagnosis not present

## 2015-12-24 DIAGNOSIS — E119 Type 2 diabetes mellitus without complications: Secondary | ICD-10-CM | POA: Diagnosis not present

## 2015-12-24 DIAGNOSIS — R1013 Epigastric pain: Secondary | ICD-10-CM | POA: Diagnosis not present

## 2015-12-24 DIAGNOSIS — Z96653 Presence of artificial knee joint, bilateral: Secondary | ICD-10-CM | POA: Diagnosis not present

## 2015-12-24 DIAGNOSIS — I10 Essential (primary) hypertension: Secondary | ICD-10-CM | POA: Diagnosis not present

## 2015-12-24 HISTORY — PX: FINE NEEDLE ASPIRATION: SHX5430

## 2015-12-24 HISTORY — PX: EUS: SHX5427

## 2015-12-24 LAB — PANC CYST FLD ANLYS-PATHFNDR-TG

## 2015-12-24 LAB — GLUCOSE, CAPILLARY: GLUCOSE-CAPILLARY: 168 mg/dL — AB (ref 65–99)

## 2015-12-24 SURGERY — UPPER ENDOSCOPIC ULTRASOUND (EUS) RADIAL
Anesthesia: Monitor Anesthesia Care

## 2015-12-24 MED ORDER — LACTATED RINGERS IV SOLN
INTRAVENOUS | Status: DC
  Administered 2015-12-24: 08:00:00 via INTRAVENOUS

## 2015-12-24 MED ORDER — ONDANSETRON HCL 4 MG/2ML IJ SOLN
INTRAMUSCULAR | Status: DC | PRN
  Administered 2015-12-24: 4 mg via INTRAVENOUS

## 2015-12-24 MED ORDER — LIDOCAINE HCL (CARDIAC) 20 MG/ML IV SOLN
INTRAVENOUS | Status: DC | PRN
  Administered 2015-12-24: 20 mg via INTRAVENOUS

## 2015-12-24 MED ORDER — METOCLOPRAMIDE HCL 5 MG/ML IJ SOLN
INTRAMUSCULAR | Status: AC
  Filled 2015-12-24: qty 2

## 2015-12-24 MED ORDER — PROPOFOL 500 MG/50ML IV EMUL
INTRAVENOUS | Status: DC | PRN
  Administered 2015-12-24: 75 ug/kg/min via INTRAVENOUS

## 2015-12-24 MED ORDER — METOCLOPRAMIDE HCL 5 MG/ML IJ SOLN
INTRAMUSCULAR | Status: DC | PRN
  Administered 2015-12-24: 10 mg via INTRAVENOUS

## 2015-12-24 MED ORDER — PROPOFOL 10 MG/ML IV BOLUS
INTRAVENOUS | Status: AC
  Filled 2015-12-24: qty 40

## 2015-12-24 MED ORDER — CIPROFLOXACIN IN D5W 400 MG/200ML IV SOLN
INTRAVENOUS | Status: AC
  Filled 2015-12-24: qty 200

## 2015-12-24 MED ORDER — ONDANSETRON HCL 4 MG/2ML IJ SOLN
INTRAMUSCULAR | Status: AC
  Filled 2015-12-24: qty 2

## 2015-12-24 MED ORDER — SODIUM CHLORIDE 0.9 % IV SOLN: INTRAVENOUS | Status: DC

## 2015-12-24 MED ORDER — DEXAMETHASONE SODIUM PHOSPHATE 10 MG/ML IJ SOLN
INTRAMUSCULAR | Status: AC
  Filled 2015-12-24: qty 1

## 2015-12-24 MED ORDER — PROPOFOL 10 MG/ML IV BOLUS
INTRAVENOUS | Status: DC | PRN
  Administered 2015-12-24 (×3): 50 mg via INTRAVENOUS

## 2015-12-24 MED ORDER — DEXAMETHASONE SODIUM PHOSPHATE 10 MG/ML IJ SOLN
INTRAMUSCULAR | Status: DC | PRN
  Administered 2015-12-24: 8 mg via INTRAVENOUS

## 2015-12-24 MED ORDER — CIPROFLOXACIN HCL 500 MG PO TABS: 500.0000 mg | ORAL_TABLET | Freq: Two times a day (BID) | ORAL | 0 refills | Status: AC

## 2015-12-24 MED ORDER — CIPROFLOXACIN IN D5W 400 MG/200ML IV SOLN
INTRAVENOUS | Status: DC | PRN
  Administered 2015-12-24: 400 mg via INTRAVENOUS

## 2015-12-24 MED ORDER — SCOPOLAMINE 1 MG/3DAYS TD PT72
MEDICATED_PATCH | TRANSDERMAL | Status: DC | PRN
  Administered 2015-12-24: 1 via TRANSDERMAL

## 2015-12-24 MED ORDER — LIDOCAINE 2% (20 MG/ML) 5 ML SYRINGE
INTRAMUSCULAR | Status: AC
  Filled 2015-12-24: qty 5

## 2015-12-24 NOTE — H&P (Signed)
Patient interval history reviewed.  Patient examined again.  There has been no change from documented H/P dated 11/18/15 (scanned into chart from our office) except as documented above.  Assessment:  1.  Pancreatic cyst. 2.  Abdominal pain.  Plan:  1.  Endoscopic ultrasound with possible pancreatic cyst aspiration. 2.  Risks (bleeding, infection, bowel perforation that could require surgery, sedation-related changes in cardiopulmonary systems), benefits (identification and possible treatment of source of symptoms, exclusion of certain causes of symptoms), and alternatives (watchful waiting, radiographic imaging studies, empiric medical treatment) of upper endoscopy with ultrasound and possible cyst aspiration or biopsy (EUS +/- FNA) were explained to patient/family in detail and patient wishes to proceed.

## 2015-12-24 NOTE — Transfer of Care (Signed)
Immediate Anesthesia Transfer of Care Note  Patient: Tracy Williams  Procedure(s) Performed: Procedure(s): UPPER ENDOSCOPIC ULTRASOUND (EUS) RADIAL (N/A) FINE NEEDLE ASPIRATION (FNA) RADIAL (N/A)  Patient Location: PACU  Anesthesia Type:MAC  Level of Consciousness:  sedated, patient cooperative and responds to stimulation  Airway & Oxygen Therapy:Patient Spontanous Breathing  Post-op Assessment:  Report given to PACU RN and Post -op Vital signs reviewed and stable  Post vital signs:  Reviewed and stable  Last Vitals:  Vitals:   12/24/15 0821  BP: (!) 145/69  Pulse: 73  Resp: 14  Temp: 123XX123 C    Complications: No apparent anesthesia complications

## 2015-12-24 NOTE — Op Note (Signed)
[facility] Patient Name: Bhumi Kerstiens Procedure Date: 12/24/2015 MRN: [id number] Attending MD: Arta Silence , MD Date of Birth: 1945-04-22 CSN: [id number] Age: 70 Admit Type: Inpatient Procedure:                Upper endoscopic ultrasound with fine needle                            aspiration Indications:              Pancreatic cyst on CT scan, Pancreatic cyst on MRI,                            Epigastric abdominal pain Providers:                Arta Silence, MD, Elmer Ramp. Hinson, RN, [employer], Technician, Lajuana Carry, CRNA Referring MD:              Medicines:                Propofol per Anesthesia, Cipro [id number] mg IV Complications:            No immediate complications. Estimated Blood Loss:     Estimated blood loss was minimal. Procedure:                Pre-Anesthesia Assessment:                           - Prior to the procedure, a History and Physical                            was performed, and patient medications and                            allergies were reviewed. The patient's tolerance of                            previous anesthesia was also reviewed. The risks                            and benefits of the procedure and the sedation                            options and risks were discussed with the patient.                            All questions were answered, and informed consent                            was obtained. Prior Anticoagulants: The patient has                            taken previous NSAID medication. ASA Grade  Assessment: II - A patient with mild systemic                            disease. After reviewing the risks and benefits,                            the patient was deemed in satisfactory condition to                            undergo the procedure.                           After obtaining informed consent, the endoscope was                            passed  under direct vision. Throughout the                            procedure, the patient's blood pressure, pulse, and                            oxygen saturations were monitored continuously. The                            [id number]) scope was introduced through                            the mouth, and advanced to the antrum of the                            stomach. The upper EUS was accomplished without                            difficulty. The patient tolerated the procedure                            well. Scope In: Scope Out: Findings:      Endosonographic Finding :      There was no sign of significant endosonographic abnormality in the       gallbladder.      There was no sign of significant endosonographic abnormality in the left       lobe of the liver.      An anechoic, multicystic and septated lesion suggestive of a cyst was       identified in the pancreatic body. It is not in obvious communication       with the pancreatic duct. The lesion measured 40 mm by 40 mm in maximal       cross-sectional diameter. There were a few compartments thickly       septated. The outer wall of the lesion was thick. There were associated       mural nodules within the wall. There was internal debris within the       fluid-filled cavity. Diagnostic needle aspiration for fluid was       performed. Color Doppler imaging was utilized prior to needle puncture  to confirm a lack of significant vascular structures within the needle       path. One pass was made with the 22 gauge needle using a transgastric       approach. A stylet was used. The amount of fluid collected was 5 mL. The       fluid was clear, white and viscous. Sample(s) were sent for amylase       concentration, cytology and CEA.      There was no sign of significant endosonographic abnormality in the       pancreatic tail. Impression:               - There was no sign of significant pathology in the                             gallbladder.                           - There was no evidence of significant pathology in                            the left lobe of the liver.                           - A cystic lesion was seen in the pancreatic body.                            Fine needle aspiration for fluid performed. Overall                            most consistent with mucinous cystadenoma.                            Appearance not typical of bland cyst or pseudocyst.                           - There was no sign of significant pathology in the                            pancreatic tail. Moderate Sedation:      N/A- Per Anesthesia Care Recommendation:           - Discharge patient to home (via wheelchair).                           - Low fat diet today.                           - Continue present medications.                           - Cipro (ciprofloxacin) 500 mg PO BID for 3 days.                           - Await cytology results and await tumor markers.                           -  Return to GI clinic PRN.                           - Pending cyst fluid and cytology results, patient                            would likely benefit from surgical consultation for                            possible lesional resection.                           - Return to referring physician as previously                            scheduled. Procedure Code(s):        --- Professional ---                           [phone], Esophagogastroduodenoscopy, flexible,                            transoral; with transendoscopic ultrasound-guided                            intramural or transmural fine needle                            aspiration/biopsy(s) (includes endoscopic                            ultrasound examination of the esophagus, stomach,                            and either the duodenum or a surgically altered                            stomach where the jejunum is examined distal to the                             anastomosis) Diagnosis Code(s):        --- Professional ---                           [id number], Cyst of pancreas                           R10.13, Epigastric pain CPT copyright 2016 American Medical Association. All rights reserved. The codes documented in this report are preliminary and upon coder review may  be revised to meet current compliance requirements. Arta Silence, MD 12/24/2015 10:07:55 AM This report has been signed electronically. Number of Addenda: 0

## 2015-12-24 NOTE — Anesthesia Postprocedure Evaluation (Signed)
Anesthesia Post Note  Patient: CARRERA PENGELLY  Procedure(s) Performed: Procedure(s) (LRB): UPPER ENDOSCOPIC ULTRASOUND (EUS) RADIAL (N/A) FINE NEEDLE ASPIRATION (FNA) RADIAL (N/A)  Patient location during evaluation: Endoscopy Anesthesia Type: MAC Level of consciousness: awake and alert Pain management: pain level controlled Vital Signs Assessment: post-procedure vital signs reviewed and stable Respiratory status: spontaneous breathing, nonlabored ventilation, respiratory function stable and patient connected to nasal cannula oxygen Cardiovascular status: stable and blood pressure returned to baseline Anesthetic complications: no    Last Vitals:  Vitals:   12/24/15 1010 12/24/15 1020  BP: (!) 115/45 112/63  Pulse: 61 64  Resp: (!) 21 (!) 23  Temp:      Last Pain:  Vitals:   12/24/15 1001  TempSrc: Oral                 Montez Hageman

## 2015-12-24 NOTE — Discharge Instructions (Signed)

## 2015-12-24 NOTE — Anesthesia Preprocedure Evaluation (Signed)
Anesthesia Evaluation  Patient identified by MRN, date of birth, ID band Patient awake    Reviewed: Allergy & Precautions, NPO status , Patient's Chart, lab work & pertinent test results  History of Anesthesia Complications (+) PONV  Airway Mallampati: II  TM Distance: >3 FB Neck ROM: Full    Dental no notable dental hx.    Pulmonary neg pulmonary ROS,    Pulmonary exam normal breath sounds clear to auscultation       Cardiovascular hypertension, Pt. on medications Normal cardiovascular exam Rhythm:Regular Rate:Normal     Neuro/Psych negative neurological ROS  negative psych ROS   GI/Hepatic negative GI ROS, Neg liver ROS,   Endo/Other  diabetes, Type 2, Oral Hypoglycemic Agents  Renal/GU negative Renal ROS  negative genitourinary   Musculoskeletal negative musculoskeletal ROS (+)   Abdominal   Peds negative pediatric ROS (+)  Hematology negative hematology ROS (+)   Anesthesia Other Findings   Reproductive/Obstetrics negative OB ROS                            Anesthesia Physical Anesthesia Plan  ASA: II  Anesthesia Plan: MAC   Post-op Pain Management:    Induction:   Airway Management Planned: Nasal Cannula  Additional Equipment:   Intra-op Plan:   Post-operative Plan:   Informed Consent: I have reviewed the patients History and Physical, chart, labs and discussed the procedure including the risks, benefits and alternatives for the proposed anesthesia with the patient or authorized representative who has indicated his/her understanding and acceptance.   Dental advisory given  Plan Discussed with: CRNA  Anesthesia Plan Comments:         Anesthesia Quick Evaluation

## 2015-12-25 ENCOUNTER — Encounter (HOSPITAL_COMMUNITY): Payer: Self-pay | Admitting: Gastroenterology

## 2016-01-19 ENCOUNTER — Encounter: Payer: Self-pay | Admitting: Family Medicine

## 2016-01-19 DIAGNOSIS — E119 Type 2 diabetes mellitus without complications: Secondary | ICD-10-CM | POA: Diagnosis not present

## 2016-01-19 DIAGNOSIS — K869 Disease of pancreas, unspecified: Secondary | ICD-10-CM | POA: Diagnosis not present

## 2016-01-29 ENCOUNTER — Ambulatory Visit (INDEPENDENT_AMBULATORY_CARE_PROVIDER_SITE_OTHER): Payer: Medicare Other | Admitting: Emergency Medicine

## 2016-01-29 DIAGNOSIS — Z23 Encounter for immunization: Secondary | ICD-10-CM | POA: Diagnosis not present

## 2016-01-29 MED ORDER — HAEMOPHILUS B POLYSAC CONJ VAC 10 MCG IJ SOLR: 1.0000 | Freq: Once | INTRAMUSCULAR | Status: DC

## 2016-01-30 ENCOUNTER — Other Ambulatory Visit: Payer: Self-pay | Admitting: General Surgery

## 2016-02-10 ENCOUNTER — Ambulatory Visit (INDEPENDENT_AMBULATORY_CARE_PROVIDER_SITE_OTHER): Payer: Medicare Other | Admitting: Physician Assistant

## 2016-02-10 ENCOUNTER — Encounter: Payer: Self-pay | Admitting: Physician Assistant

## 2016-02-10 ENCOUNTER — Telehealth: Payer: Self-pay | Admitting: Family Medicine

## 2016-02-10 VITALS — BP 148/62 | HR 67 | Temp 97.9°F | Resp 16 | Ht 65.0 in | Wt 171.0 lb

## 2016-02-10 DIAGNOSIS — M545 Low back pain, unspecified: Secondary | ICD-10-CM

## 2016-02-10 MED ORDER — MELOXICAM 7.5 MG PO TABS: 7.5000 mg | ORAL_TABLET | Freq: Every day | ORAL | 0 refills | Status: DC

## 2016-02-10 MED ORDER — DIAZEPAM 2 MG PO TABS: 1.0000 mg | ORAL_TABLET | Freq: Two times a day (BID) | ORAL | 0 refills | Status: DC | PRN

## 2016-02-10 NOTE — Patient Instructions (Signed)
Avoid heavy lifting or overexertion. Take the Meloxicam once daily over the next few days (take with food). Use tylenol if needed for breakthrough pain.  Take 1/2 tablet Valium (diazepam) up to every twelve hours if needed for muscle spasms. Do not drive or operate heavy machinery while on this medication.   Continue heating pad to the area. Get up and move around as you are able to tolerate it.  If symptoms are not quickly improving, please call or come see [place].

## 2016-02-10 NOTE — Telephone Encounter (Signed)
Based on the fall she took, it would be better for her to be evaluated prior to ordering imaging.  Please see if she is willing to see [facility] for evaluation and tx

## 2016-02-10 NOTE — Telephone Encounter (Signed)
Pt states that she is still in pain with her back and asking if she could have a xray done at the [place] location. Pt not sure if KT would have to see her first.

## 2016-02-10 NOTE — Telephone Encounter (Signed)
Can you please call and schedule pt with cody if she is willing?

## 2016-02-10 NOTE — Progress Notes (Signed)
Patient presents to clinic today c/o fall about a week ago when she lost her balance and fell onto her buttocks. Denies any preceding lightheadedness or dizziness. Denies head trauma or injury. Endorses pain developed over the next few days and has been present since that. Endorses pain at present is bilateral, lumbar spine, worse with movement, especially bending forward. Describes pain is aching, 7/10 at present. 9/10 at worst. Denies radiation of pain into lower extremities. Denies saddle anesthesia. Denies change to bowel or bladder habits.  Is taking Ibuprofen every 6 hours with some relief in symptoms. Has been also trying to use a heating pad which helps some as well.   Past Medical History:  Diagnosis Date  . Allergy   . Arthritis   . Diabetes mellitus without complication (Ed[place])   . Dysrhythmia    Hx: of palpitations in 2010 only  . GERD (gastroesophageal reflux disease)   . Hypertension   . PONV (postoperative nausea and vomiting)   . Right knee DJD     Current Outpatient Prescriptions on File Prior to Visit  Medication Sig Dispense Refill  . atorvastatin (LIPITOR) 20 MG tablet TAKE 1 TABLET (20 MG TOTAL) BY MOUTH AT BEDTIME. 90 tablet 1  . glipiZIDE (GLUCOTROL XL) 5 MG 24 hr tablet Take 1 tablet (5 mg total) by mouth daily with breakfast. 30 tablet 6  . lisinopril (PRINIVIL,ZESTRIL) 10 MG tablet TAKE 1 TABLET (10 MG TOTAL) BY MOUTH DAILY. 90 tablet 1  . ranitidine (ZANTAC) 150 MG tablet Take 150 mg by mouth 2 (two) times daily as needed for heartburn.    . sitaGLIPtin (JANUVIA) 100 MG tablet Take 1 tablet (100 mg total) by mouth daily. 30 tablet 6  . triamcinolone ointment (KENALOG) 0.1 % Apply 1 application topically 2 (two) times daily. 80 g 1   No current facility-administered medications on file prior to visit.     Allergies  Allergen Reactions  . Metformin And Related Nausea And Vomiting  . Darvocet [Propoxyphene N-Acetaminophen] Nausea And Vomiting  . Demerol  [Meperidine] Nausea And Vomiting  . Hydrochlorothiazide Nausea And Vomiting  . Percocet [Oxycodone-Acetaminophen] Nausea And Vomiting  . Vicodin [Hydrocodone-Acetaminophen] Nausea And Vomiting    Family History  Problem Relation Age of Onset  . Diabetes Mellitus II Father   . Hypertension Father   . Cancer - Other Mother     Breast    Social History   Social History  . Marital status: Married    Spouse name: N/A  . Number of children: N/A  . Years of education: N/A   Social History Main Topics  . Smoking status: Never Smoker  . Smokeless tobacco: Never Used  . Alcohol use No  . Drug use: No  . Sexual activity: Not Asked   Other Topics Concern  . None   Social History Narrative  . None   Review of Systems - See HPI.  All other ROS are negative.  BP (!) 148/62   Pulse 67   Temp 97.9 F (36.6 C) (Oral)   Resp 16   Ht 5\' 5"  (1.651 m)   Wt 171 lb (77.6 kg)   SpO2 97%   BMI 28.46 kg/m   Physical Exam  Constitutional: She is oriented to person, place, and time and well-developed, well-nourished, and in no distress.  HENT:  Head: Normocephalic and atraumatic.  Cardiovascular: Normal rate, regular rhythm, normal heart sounds and intact distal pulses.   Pulmonary/Chest: Effort normal and breath sounds normal. No  respiratory distress. She has no wheezes. She has no rales. She exhibits no tenderness.  Musculoskeletal:       Cervical back: Normal.       Thoracic back: Normal.       Lumbar back: She exhibits tenderness, pain and spasm. She exhibits normal range of motion, no bony tenderness, no swelling, no edema and no deformity.  Pain in bilateral lumbar perispinous musculature reproduced with lateral bending, flexion, extension and rotation of torso.   Neurological: She is alert and oriented to person, place, and time.  Skin: Skin is warm and dry. No rash noted.  Psychiatric: Affect normal.  Vitals reviewed.  Recent Results (from the past 2160 hour(s))  Lipid  panel     Status: Abnormal   Collection Time: 12/15/15 10:21 AM  Result Value Ref Range   Cholesterol 278 (H) 0 - 200 mg/dL    Comment: ATP III Classification       Desirable:  < 200 mg/dL               Borderline High:  200 - 239 mg/dL          High:  > = 240 mg/dL   Triglycerides 210.0 (H) 0.0 - 149.0 mg/dL    Comment: Normal:  <150 mg/dLBorderline High:  150 - 199 mg/dL   HDL 37.80 (L) >39.00 mg/dL   VLDL 42.0 (H) 0.0 - 40.0 mg/dL   Total CHOL/HDL Ratio 7     Comment:                Men          Women1/2 Average Risk     3.4          3.3Average Risk          5.0          4.42X Average Risk          9.6          7.13X Average Risk          15.0          11.0                       NonHDL 240.50     Comment: NOTE:  Non-HDL goal should be 30 mg/dL higher than patient's LDL goal (i.e. LDL goal of < 70 mg/dL, would have non-HDL goal of < 100 mg/dL)  Basic metabolic panel     Status: Abnormal   Collection Time: 12/15/15 10:21 AM  Result Value Ref Range   Sodium 139 135 - 145 mEq/L   Potassium 4.6 3.5 - 5.1 mEq/L   Chloride 102 96 - 112 mEq/L   CO2 31 19 - 32 mEq/L   Glucose, Bld 165 (H) 70 - 99 mg/dL   BUN 23 6 - 23 mg/dL   Creatinine, Ser 0.97 0.40 - 1.20 mg/dL   Calcium 8.9 8.4 - 10.5 mg/dL   GFR 60.34 >60.00 mL/min  Hepatic function panel     Status: None   Collection Time: 12/15/15 10:21 AM  Result Value Ref Range   Total Bilirubin 0.8 0.2 - 1.2 mg/dL   Bilirubin, Direct 0.1 0.0 - 0.3 mg/dL   Alkaline Phosphatase 89 39 - 117 U/L   AST 9 0 - 37 U/L   ALT 10 0 - 35 U/L   Total Protein 6.4 6.0 - 8.3 g/dL   Albumin 3.9 3.5 - 5.2 g/dL  Hemoglobin A1c  Status: Abnormal   Collection Time: 12/15/15 10:21 AM  Result Value Ref Range   Hgb A1c MFr Bld 8.5 (H) 4.6 - 6.5 %    Comment: Glycemic Control Guidelines for People with Diabetes:Non Diabetic:  <6%Goal of Therapy: <7%Additional Action Suggested:  >8%   CBC with Differential/Platelet     Status: None   Collection Time: 12/15/15  10:21 AM  Result Value Ref Range   WBC 6.4 4.0 - 10.5 K/uL   RBC 4.34 3.87 - 5.11 Mil/uL   Hemoglobin 12.3 12.0 - 15.0 g/dL   HCT 36.6 36.0 - 46.0 %   MCV 84.2 78.0 - 100.0 fl   MCHC 33.7 30.0 - 36.0 g/dL   RDW 13.5 11.5 - 15.5 %   Platelets 213.0 150.0 - 400.0 K/uL   Neutrophils Relative % 72.5 43.0 - 77.0 %   Lymphocytes Relative 16.9 12.0 - 46.0 %   Monocytes Relative 7.1 3.0 - 12.0 %   Eosinophils Relative 3.2 0.0 - 5.0 %   Basophils Relative 0.3 0.0 - 3.0 %   Neutro Abs 4.6 1.4 - 7.7 K/uL   Lymphs Abs 1.1 0.7 - 4.0 K/uL   Monocytes Absolute 0.5 0.1 - 1.0 K/uL   Eosinophils Absolute 0.2 0.0 - 0.7 K/uL   Basophils Absolute 0.0 0.0 - 0.1 K/uL  Amylase     Status: None   Collection Time: 12/15/15 10:21 AM  Result Value Ref Range   Amylase 70 27 - 131 U/L  Lipase     Status: None   Collection Time: 12/15/15 10:21 AM  Result Value Ref Range   Lipase 33.0 11.0 - 59.0 U/L  LDL cholesterol, direct     Status: None   Collection Time: 12/15/15 10:21 AM  Result Value Ref Range   Direct LDL 188.0 mg/dL    Comment: Optimal:  <100 mg/dLNear or Above Optimal:  100-129 mg/dLBorderline High:  130-159 mg/dLHigh:  160-189 mg/dLVery High:  >190 mg/dL  Glucose, capillary     Status: Abnormal   Collection Time: 12/24/15  8:28 AM  Result Value Ref Range   Glucose-Capillary 168 (H) 65 - 99 mg/dL  Panc cyst fld [id number]     Status: None   Collection Time: 12/24/15 10:00 AM  Result Value Ref Range   Pathfinder TG, Pancreatic Cyst Fluid SPECIMEN SENT TO [facility]    Assessment/Plan: 1. Acute bilateral low back pain without sciatica Seems MSK in nature. No bony tenderness to palpation or percussion. No radiation of pain. Will stop her Naproxen. RX Mobic at 7.5 mg once daily with food. Continue heating pad. Stretching discussed. No heavy lifting or overexertion. Discussed treatment for spasm. Want to avoid traditional relaxants. Will attempt Diazepam 1 mg up to every 12 hours if  needed for muscle spasms. She is aware she cannot take and drive or operate heavy machinery. Is going to take in the evening only. FU if symptoms are not improving quickly. Alarm signs/symptoms discussed that would prompt ER assessment.   - meloxicam (MOBIC) 7.5 MG tablet; Take 1 tablet (7.5 mg total) by mouth daily.  Dispense: 15 tablet; Refill: 0 - diazepam (VALIUM) 2 MG tablet; Take 0.5 tablets (1 mg total) by mouth every 12 (twelve) hours as needed for muscle spasms.  Dispense: 10 tablet; Refill: 0   Leeanne Rio, [place]

## 2016-02-10 NOTE — Telephone Encounter (Signed)
Pt has been schedule with Einar Pheasant for today.

## 2016-02-10 NOTE — Progress Notes (Signed)
Pre visit review using our clinic review tool, if applicable. No additional management support is needed unless otherwise documented below in the visit note. 

## 2016-02-19 ENCOUNTER — Encounter (HOSPITAL_COMMUNITY): Payer: Self-pay

## 2016-02-19 ENCOUNTER — Encounter (HOSPITAL_COMMUNITY)
Admission: RE | Admit: 2016-02-19 | Discharge: 2016-02-19 | Disposition: A | Discharge status: Home / Self Care | Payer: Medicare Other | Source: Ambulatory Visit | Attending: General Surgery | Admitting: General Surgery

## 2016-02-19 DIAGNOSIS — Z01812 Encounter for preprocedural laboratory examination: Secondary | ICD-10-CM | POA: Diagnosis not present

## 2016-02-19 DIAGNOSIS — K869 Disease of pancreas, unspecified: Secondary | ICD-10-CM | POA: Insufficient documentation

## 2016-02-19 DIAGNOSIS — Z0183 Encounter for blood typing: Secondary | ICD-10-CM | POA: Insufficient documentation

## 2016-02-19 HISTORY — DX: Urgency of urination: R39.15

## 2016-02-19 HISTORY — DX: Frequency of micturition: R35.0

## 2016-02-19 HISTORY — DX: Stress incontinence (female) (male): N39.3

## 2016-02-19 HISTORY — DX: Presence of spectacles and contact lenses: Z97.3

## 2016-02-19 LAB — CBC WITH DIFFERENTIAL/PLATELET
BASOS ABS: 0 10*3/uL (ref 0.0–0.1)
BASOS PCT: 0 %
EOS ABS: 0.2 10*3/uL (ref 0.0–0.7)
EOS PCT: 3 %
HCT: 36.1 % (ref 36.0–46.0)
Hemoglobin: 11.8 g/dL — ABNORMAL LOW (ref 12.0–15.0)
LYMPHS PCT: 14 %
Lymphs Abs: 1 10*3/uL (ref 0.7–4.0)
MCH: 27.8 pg (ref 26.0–34.0)
MCHC: 32.7 g/dL (ref 30.0–36.0)
MCV: 85.1 fL (ref 78.0–100.0)
Monocytes Absolute: 0.4 10*3/uL (ref 0.1–1.0)
Monocytes Relative: 6 %
Neutro Abs: 5.5 10*3/uL (ref 1.7–7.7)
Neutrophils Relative %: 77 %
PLATELETS: 259 10*3/uL (ref 150–400)
RBC: 4.24 MIL/uL (ref 3.87–5.11)
RDW: 14 % (ref 11.5–15.5)
WBC: 7.2 10*3/uL (ref 4.0–10.5)

## 2016-02-19 LAB — COMPREHENSIVE METABOLIC PANEL
ALBUMIN: 4 g/dL (ref 3.5–5.0)
ALT: 25 U/L (ref 14–54)
AST: 19 U/L (ref 15–41)
Alkaline Phosphatase: 102 U/L (ref 38–126)
Anion gap: 8 (ref 5–15)
BUN: 24 mg/dL — AB (ref 6–20)
CHLORIDE: 107 mmol/L (ref 101–111)
CO2: 24 mmol/L (ref 22–32)
CREATININE: 1.3 mg/dL — AB (ref 0.44–1.00)
Calcium: 9.4 mg/dL (ref 8.9–10.3)
GFR calc Af Amer: 47 mL/min — ABNORMAL LOW (ref >60)
GFR calc non Af Amer: 41 mL/min — ABNORMAL LOW (ref >60)
Glucose, Bld: 152 mg/dL — ABNORMAL HIGH (ref 65–99)
Potassium: 4.2 mmol/L (ref 3.5–5.1)
SODIUM: 139 mmol/L (ref 135–145)
Total Bilirubin: 0.7 mg/dL (ref 0.3–1.2)
Total Protein: 6.9 g/dL (ref 6.5–8.1)

## 2016-02-19 LAB — URINE MICROSCOPIC-ADD ON

## 2016-02-19 LAB — URINALYSIS, ROUTINE W REFLEX MICROSCOPIC
BILIRUBIN URINE: NEGATIVE
GLUCOSE, UA: NEGATIVE mg/dL
Hgb urine dipstick: NEGATIVE
KETONES UR: NEGATIVE mg/dL
NITRITE: NEGATIVE
PROTEIN: NEGATIVE mg/dL
Specific Gravity, Urine: 1.022 (ref 1.005–1.030)
pH: 5 (ref 5.0–8.0)

## 2016-02-19 LAB — GLUCOSE, CAPILLARY: GLUCOSE-CAPILLARY: 209 mg/dL — AB (ref 65–99)

## 2016-02-19 LAB — PROTIME-INR
INR: 1.1
Prothrombin Time: 14.3 seconds (ref 11.4–15.2)

## 2016-02-19 NOTE — Pre-Procedure Instructions (Signed)
Tracy Williams  02/19/2016      [facility] - [place] - [address] [place] [address] Phone: [phone] Fax: [phone]  [id number] - [place], [place] [address] [place] [address] Phone: [phone] Fax: [phone]    Your procedure is scheduled on Wednesday, December 6th, 2017.  Report to [facility] Admitting at 11:00 A.M.   Call this number if you have problems the morning of surgery:  [phone]   Remember:  Do not eat food or drink liquids after midnight.   Take these medicines the morning of surgery with A SIP OF WATER: Acetaminophen (Tylenol), Diazepam (Valium) if needed, Ranitidine (Zantac).    Stop taking: Meloxicam (Mobic), Aspirin, NSAIDS, Aleve, Naproxen, Ibuprofen, Advil, Motrin, BC's, Goody's, Fish oil, all herbal medications, and all vitamins.     WHAT DO I DO ABOUT MY DIABETES MEDICATION?  Marland Kitchen Do not take oral diabetes medicines (pills) the morning of surgery.  Do NOT take Glipizide (Glucotrol XL) the morning of surgery.   How to Manage Your Diabetes Before and After Surgery  Why is it important to control my blood sugar before and after surgery? . Improving blood sugar levels before and after surgery helps healing and can limit problems. . A way of improving blood sugar control is eating a healthy diet by: o  Eating less sugar and carbohydrates o  Increasing activity/exercise o  Talking with your doctor about reaching your blood sugar goals . High blood sugars (greater than 180 mg/dL) can raise your risk of infections and slow your recovery, so you will need to focus on controlling your diabetes during the weeks before surgery. . Make sure that the doctor who takes care of your diabetes knows about your planned surgery including the date and location.  How do I manage my blood sugar before surgery? . Check  your blood sugar at least 4 times a day, starting 2 days before surgery, to make sure that the level is not too high or low. o Check your blood sugar the morning of your surgery when you wake up and every 2 hours until you get to the Short Stay unit. . If your blood sugar is less than 70 mg/dL, you will need to treat for low blood sugar: o Do not take insulin. o Treat a low blood sugar (less than 70 mg/dL) with  cup of clear juice (cranberry or apple), 4 glucose tablets, OR glucose gel. o Recheck blood sugar in 15 minutes after treatment (to make sure it is greater than 70 mg/dL). If your blood sugar is not greater than 70 mg/dL on recheck, call [phone] for further instructions. . Report your blood sugar to the short stay nurse when you get to Short Stay.  . If you are admitted to the hospital after surgery: o Your blood sugar will be checked by the staff and you will probably be given insulin after surgery (instead of oral diabetes medicines) to make sure you have good blood sugar levels. o The goal for blood sugar control after surgery is 80-180 mg/dL.    Do not wear jewelry, make-up or nail polish.  Do not wear lotions, powders, or perfumes, or deoderant.  Do not shave 48 hours prior to surgery.    Do not bring valuables to the hospital.  [facility] is not responsible for any belongings or valuables.  Contacts, dentures or  bridgework may not be worn into surgery.  Leave your suitcase in the car.  After surgery it may be brought to your room.  For patients admitted to the hospital, discharge time will be determined by your treatment team.  Patients discharged the day of surgery will not be allowed to drive home.   Special instructions:  Preparing for Surgery.   - Preparing For Surgery  Before surgery, you can play an important role. Because skin is not sterile, your skin needs to be as free of germs as possible. You can reduce the number of germs on your skin by  washing with CHG (chlorahexidine gluconate) Soap before surgery.  CHG is an antiseptic cleaner which kills germs and bonds with the skin to continue killing germs even after washing.  Please do not use if you have an allergy to CHG or antibacterial soaps. If your skin becomes reddened/irritated stop using the CHG.  Do not shave (including legs and underarms) for at least 48 hours prior to first CHG shower. It is OK to shave your face.  Please follow these instructions carefully.   1. Shower the NIGHT BEFORE SURGERY and the MORNING OF SURGERY with CHG.   2. If you chose to wash your hair, wash your hair first as usual with your normal shampoo.  3. After you shampoo, rinse your hair and body thoroughly to remove the shampoo.  4. Use CHG as you would any other liquid soap. You can apply CHG directly to the skin and wash gently with a scrungie or a clean washcloth.   5. Apply the CHG Soap to your body ONLY FROM THE NECK DOWN.  Do not use on open wounds or open sores. Avoid contact with your eyes, ears, mouth and genitals (private parts). Wash genitals (private parts) with your normal soap.  6. Wash thoroughly, paying special attention to the area where your surgery will be performed.  7. Thoroughly rinse your body with warm water from the neck down.  8. DO NOT shower/wash with your normal soap after using and rinsing off the CHG Soap.  9. Pat yourself dry with a CLEAN TOWEL.   10. Wear CLEAN PAJAMAS   11. Place CLEAN SHEETS on your bed the night of your first shower and DO NOT SLEEP WITH PETS.  Day of Surgery: Do not apply any deodorants/lotions. Please wear clean clothes to the hospital/surgery center.     Please read over the following fact sheets that you were given.

## 2016-02-19 NOTE — Progress Notes (Signed)
Notified Abigail Butts at Dr. Marlowe Aschoff office of abnormal urine.

## 2016-02-19 NOTE — Progress Notes (Signed)
Per Abigail Butts at Dr. Marlowe Aschoff office, patient will be started on Cipro for 3 days for positive bacteria in urine and Dr. Barry Dienes does request that patient complete a 1 day bowel prep prior to surgery.  Abigail Butts stated that she had communicated this with the patient and that patient verbalized understanding.

## 2016-02-19 NOTE — Progress Notes (Signed)
PCP - Dr. [name] Cardiologist - denies  EKG - 11/09/69 CXR - denies  Echo/stress test/cardiac cath - denies  Patient denies chest pain and shortness of breath at PAT appointment.    Nurse called Dr. Marlowe Aschoff office about order for 1 day bowel prep - spoke to [place] who stated that she was not aware of a bowel prep for this surgery and if there needed to be a bowel prep, the office would call patient.    Patient informed nurse that she does not check her blood sugar at home and that she often forgets to take her Glipizide.  Patient was educated on importance of taking medication as prescribed and tight glucose control.  Patient stated that she would make a better effort to check her blood sugar and take her medications as prescribed.

## 2016-02-20 LAB — HEMOGLOBIN A1C
Hgb A1c MFr Bld: 6.8 % — ABNORMAL HIGH (ref 4.8–5.6)
Mean Plasma Glucose: 148 mg/dL

## 2016-02-20 NOTE — Progress Notes (Addendum)
Anesthesia Chart Review:  Pt is a 70 year old female scheduled for laparoscopic distal pnacreatectomy, laparoscopic splenectomy on 02/25/2016 with Stark Klein, MD.   - PCP is [name], MD  PMH includes:  HTN, DM, palpitations (in 2010), anemia, post-op N/V, GERD. Never smoker. BMI 28. 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   Medications include: lipitor, glipizide, lisinopril, sitagliptin  Preoperative labs reviewed.   - HgbA1c 6.8, glucose 152 - Many bacteria on UA. PAT RN notified Dr. Marlowe Aschoff office.  - Cr 1.3, BUN 24.  Baseline Cr appears to be around 0.8-0.9.  I routed lab results to PCP for follow up.    CXR 11/08/15: Mild left basilar atelectasis.  EKG 11/08/15: Sinus rhythm. PVCs. Aberrant complex. Low voltage, extremity and precordial leads. Baseline wander in lead(s) I II aVR  If no changes, I anticipate pt can proceed with surgery as scheduled.   Willeen Cass, FNP-BC [facility]/Anesthesiology Phone: [phone] 02/20/2016 2:01 PM  Addendum:  Pt's PCP, Dr. Birdie Riddle, felt rise in Cr due to meloxicam, instructed pt to stop it. Rechecked BMET 02/23/16, Cr down slightly to 1.26.   If no changes, I anticipate pt can proceed with surgery as scheduled.   Willeen Cass, FNP-BC [facility] Stay [facility]/Anesthesiology Phone: [phone] 02/24/2016 12:58 PM

## 2016-02-23 ENCOUNTER — Other Ambulatory Visit (INDEPENDENT_AMBULATORY_CARE_PROVIDER_SITE_OTHER): Payer: Medicare Other

## 2016-02-23 ENCOUNTER — Other Ambulatory Visit: Payer: Self-pay | Admitting: Family Medicine

## 2016-02-23 DIAGNOSIS — R7989 Other specified abnormal findings of blood chemistry: Secondary | ICD-10-CM

## 2016-02-23 DIAGNOSIS — R799 Abnormal finding of blood chemistry, unspecified: Secondary | ICD-10-CM | POA: Diagnosis not present

## 2016-02-23 LAB — BASIC METABOLIC PANEL
BUN: 33 mg/dL — ABNORMAL HIGH (ref 6–23)
CALCIUM: 9.2 mg/dL (ref 8.4–10.5)
CO2: 25 meq/L (ref 19–32)
Chloride: 103 mEq/L (ref 96–112)
Creatinine, Ser: 1.26 mg/dL — ABNORMAL HIGH (ref 0.40–1.20)
GFR: 44.59 mL/min — AB (ref >60.00)
GLUCOSE: 106 mg/dL — AB (ref 70–99)
POTASSIUM: 4.4 meq/L (ref 3.5–5.1)
SODIUM: 137 meq/L (ref 135–145)

## 2016-02-23 NOTE — Progress Notes (Signed)
Spoke to pt and she was advised to stop meloxicam. Labs were ordered and pt will go to [facility] this afternoon to have them drawn.

## 2016-02-25 ENCOUNTER — Inpatient Hospital Stay (HOSPITAL_COMMUNITY): Payer: Medicare Other | Admitting: Certified Registered"

## 2016-02-25 ENCOUNTER — Inpatient Hospital Stay (HOSPITAL_COMMUNITY): Payer: Medicare Other | Admitting: Emergency Medicine

## 2016-02-25 ENCOUNTER — Inpatient Hospital Stay (HOSPITAL_COMMUNITY)
Admission: RE | Admit: 2016-02-25 | Discharge: 2016-03-02 | DRG: 405 | Disposition: A | Discharge status: Home / Self Care | Payer: Medicare Other | Source: Ambulatory Visit | Attending: General Surgery | Admitting: General Surgery

## 2016-02-25 ENCOUNTER — Encounter (HOSPITAL_COMMUNITY): Payer: Self-pay | Admitting: *Deleted

## 2016-02-25 ENCOUNTER — Encounter (HOSPITAL_COMMUNITY)
Admission: RE | Disposition: A | Discharge status: Home / Self Care | Payer: Self-pay | Source: Ambulatory Visit | Attending: General Surgery

## 2016-02-25 DIAGNOSIS — I10 Essential (primary) hypertension: Secondary | ICD-10-CM | POA: Diagnosis not present

## 2016-02-25 DIAGNOSIS — M545 Low back pain, unspecified: Secondary | ICD-10-CM

## 2016-02-25 DIAGNOSIS — Z803 Family history of malignant neoplasm of breast: Secondary | ICD-10-CM | POA: Diagnosis not present

## 2016-02-25 DIAGNOSIS — Z7984 Long term (current) use of oral hypoglycemic drugs: Secondary | ICD-10-CM | POA: Diagnosis not present

## 2016-02-25 DIAGNOSIS — E119 Type 2 diabetes mellitus without complications: Secondary | ICD-10-CM | POA: Diagnosis not present

## 2016-02-25 DIAGNOSIS — Z793 Long term (current) use of hormonal contraceptives: Secondary | ICD-10-CM

## 2016-02-25 DIAGNOSIS — Z833 Family history of diabetes mellitus: Secondary | ICD-10-CM

## 2016-02-25 DIAGNOSIS — K862 Cyst of pancreas: Secondary | ICD-10-CM | POA: Diagnosis present

## 2016-02-25 DIAGNOSIS — K567 Ileus, unspecified: Secondary | ICD-10-CM | POA: Diagnosis present

## 2016-02-25 DIAGNOSIS — K660 Peritoneal adhesions (postprocedural) (postinfection): Secondary | ICD-10-CM | POA: Diagnosis not present

## 2016-02-25 DIAGNOSIS — M1990 Unspecified osteoarthritis, unspecified site: Secondary | ICD-10-CM | POA: Diagnosis present

## 2016-02-25 DIAGNOSIS — Z885 Allergy status to narcotic agent status: Secondary | ICD-10-CM

## 2016-02-25 DIAGNOSIS — K8689 Other specified diseases of pancreas: Secondary | ICD-10-CM | POA: Diagnosis not present

## 2016-02-25 DIAGNOSIS — D136 Benign neoplasm of pancreas: Principal | ICD-10-CM | POA: Diagnosis present

## 2016-02-25 DIAGNOSIS — E7800 Pure hypercholesterolemia, unspecified: Secondary | ICD-10-CM | POA: Diagnosis not present

## 2016-02-25 DIAGNOSIS — K859 Acute pancreatitis without necrosis or infection, unspecified: Secondary | ICD-10-CM | POA: Diagnosis present

## 2016-02-25 DIAGNOSIS — K219 Gastro-esophageal reflux disease without esophagitis: Secondary | ICD-10-CM | POA: Diagnosis present

## 2016-02-25 DIAGNOSIS — F329 Major depressive disorder, single episode, unspecified: Secondary | ICD-10-CM | POA: Diagnosis not present

## 2016-02-25 DIAGNOSIS — R109 Unspecified abdominal pain: Secondary | ICD-10-CM | POA: Diagnosis not present

## 2016-02-25 HISTORY — PX: PANCREATECTOMY: SHX5261

## 2016-02-25 HISTORY — PX: LAPAROSCOPIC SPLENECTOMY: SHX409

## 2016-02-25 HISTORY — PX: LAPAROSCOPIC DISTAL PANCREATECTOMY: SHX1922

## 2016-02-25 HISTORY — PX: SPLENECTOMY: SUR1306

## 2016-02-25 HISTORY — DX: Anemia, unspecified: D64.9

## 2016-02-25 HISTORY — DX: Other specified diseases of pancreas: K86.89

## 2016-02-25 LAB — CBC
HCT: 35.8 % — ABNORMAL LOW (ref 36.0–46.0)
HEMOGLOBIN: 12 g/dL (ref 12.0–15.0)
MCH: 28.6 pg (ref 26.0–34.0)
MCHC: 33.5 g/dL (ref 30.0–36.0)
MCV: 85.2 fL (ref 78.0–100.0)
PLATELETS: 220 10*3/uL (ref 150–400)
RBC: 4.2 MIL/uL (ref 3.87–5.11)
RDW: 13.7 % (ref 11.5–15.5)
WBC: 20.9 10*3/uL — AB (ref 4.0–10.5)

## 2016-02-25 LAB — PREPARE RBC (CROSSMATCH)

## 2016-02-25 LAB — CREATININE, SERUM
CREATININE: 1.15 mg/dL — AB (ref 0.44–1.00)
GFR calc Af Amer: 55 mL/min — ABNORMAL LOW (ref >60)
GFR, EST NON AFRICAN AMERICAN: 47 mL/min — AB (ref >60)

## 2016-02-25 LAB — GLUCOSE, CAPILLARY
GLUCOSE-CAPILLARY: 162 mg/dL — AB (ref 65–99)
GLUCOSE-CAPILLARY: 181 mg/dL — AB (ref 65–99)
Glucose-Capillary: 147 mg/dL — ABNORMAL HIGH (ref 65–99)

## 2016-02-25 SURGERY — PANCREATECTOMY, LAPAROSCOPIC
Anesthesia: General | Site: Abdomen

## 2016-02-25 MED ORDER — PHENYLEPHRINE 40 MCG/ML (10ML) SYRINGE FOR IV PUSH (FOR BLOOD PRESSURE SUPPORT)
PREFILLED_SYRINGE | INTRAVENOUS | Status: AC
  Filled 2016-02-25: qty 10

## 2016-02-25 MED ORDER — ONDANSETRON HCL 4 MG/2ML IJ SOLN
INTRAMUSCULAR | Status: AC
  Filled 2016-02-25: qty 2

## 2016-02-25 MED ORDER — FENTANYL CITRATE (PF) 100 MCG/2ML IJ SOLN
INTRAMUSCULAR | Status: AC
  Filled 2016-02-25: qty 4

## 2016-02-25 MED ORDER — DIPHENHYDRAMINE HCL 50 MG/ML IJ SOLN: 12.5000 mg | Freq: Four times a day (QID) | INTRAMUSCULAR | Status: DC | PRN

## 2016-02-25 MED ORDER — ZOLPIDEM TARTRATE 5 MG PO TABS: 5.0000 mg | ORAL_TABLET | Freq: Every evening | ORAL | Status: DC | PRN

## 2016-02-25 MED ORDER — SCOPOLAMINE 1 MG/3DAYS TD PT72
MEDICATED_PATCH | TRANSDERMAL | Status: AC
  Filled 2016-02-25: qty 1

## 2016-02-25 MED ORDER — SUGAMMADEX SODIUM 200 MG/2ML IV SOLN
INTRAVENOUS | Status: AC
  Filled 2016-02-25: qty 2

## 2016-02-25 MED ORDER — METOCLOPRAMIDE HCL 5 MG/ML IJ SOLN
INTRAMUSCULAR | Status: DC | PRN
  Administered 2016-02-25: 10 mg via INTRAVENOUS

## 2016-02-25 MED ORDER — SUGAMMADEX SODIUM 200 MG/2ML IV SOLN
INTRAVENOUS | Status: DC | PRN
  Administered 2016-02-25: 200 mg via INTRAVENOUS

## 2016-02-25 MED ORDER — MIDAZOLAM HCL 5 MG/5ML IJ SOLN
INTRAMUSCULAR | Status: DC | PRN
  Administered 2016-02-25: 2 mg via INTRAVENOUS

## 2016-02-25 MED ORDER — PHENYLEPHRINE 40 MCG/ML (10ML) SYRINGE FOR IV PUSH (FOR BLOOD PRESSURE SUPPORT)
PREFILLED_SYRINGE | INTRAVENOUS | Status: DC | PRN
  Administered 2016-02-25 (×2): 80 ug via INTRAVENOUS

## 2016-02-25 MED ORDER — SODIUM CHLORIDE 0.9 % IR SOLN
Status: DC | PRN
  Administered 2016-02-25: 1000 mL

## 2016-02-25 MED ORDER — FENTANYL CITRATE (PF) 100 MCG/2ML IJ SOLN
INTRAMUSCULAR | Status: AC
  Administered 2016-02-25: 50 ug via INTRAVENOUS
  Filled 2016-02-25: qty 2

## 2016-02-25 MED ORDER — NALOXONE HCL 0.4 MG/ML IJ SOLN: 0.4000 mg | INTRAMUSCULAR | Status: DC | PRN

## 2016-02-25 MED ORDER — CEFAZOLIN SODIUM-DEXTROSE 2-4 GM/100ML-% IV SOLN
2.0000 g | INTRAVENOUS | Status: AC
  Administered 2016-02-25: 2 g via INTRAVENOUS
  Filled 2016-02-25: qty 100

## 2016-02-25 MED ORDER — ONDANSETRON HCL 4 MG/2ML IJ SOLN
4.0000 mg | Freq: Four times a day (QID) | INTRAMUSCULAR | Status: DC | PRN
  Administered 2016-02-26: 4 mg via INTRAVENOUS
  Filled 2016-02-25: qty 2

## 2016-02-25 MED ORDER — LISINOPRIL 10 MG PO TABS
10.0000 mg | ORAL_TABLET | Freq: Every day | ORAL | Status: DC
  Administered 2016-02-26 – 2016-03-02 (×6): 10 mg via ORAL
  Filled 2016-02-25 (×7): qty 1

## 2016-02-25 MED ORDER — MORPHINE SULFATE (PF) 2 MG/ML IV SOLN: 1.0000 mg | INTRAVENOUS | Status: DC | PRN

## 2016-02-25 MED ORDER — CEFAZOLIN SODIUM-DEXTROSE 2-4 GM/100ML-% IV SOLN
2.0000 g | Freq: Three times a day (TID) | INTRAVENOUS | Status: AC
  Administered 2016-02-25: 2 g via INTRAVENOUS
  Filled 2016-02-25: qty 100

## 2016-02-25 MED ORDER — MORPHINE SULFATE 2 MG/ML IV SOLN
INTRAVENOUS | Status: AC
  Filled 2016-02-25: qty 25

## 2016-02-25 MED ORDER — GLYCOPYRROLATE 0.2 MG/ML IV SOSY
PREFILLED_SYRINGE | INTRAVENOUS | Status: AC
  Filled 2016-02-25: qty 3

## 2016-02-25 MED ORDER — ACETAMINOPHEN 500 MG PO TABS
1000.0000 mg | ORAL_TABLET | Freq: Four times a day (QID) | ORAL | Status: DC | PRN
  Administered 2016-02-26 – 2016-03-02 (×14): 1000 mg via ORAL
  Filled 2016-02-25 (×14): qty 2

## 2016-02-25 MED ORDER — POLYETHYLENE GLYCOL 3350 17 G PO PACK: 17.0000 g | PACK | Freq: Every day | ORAL | Status: DC | PRN

## 2016-02-25 MED ORDER — DIAZEPAM 2 MG PO TABS
2.0000 mg | ORAL_TABLET | Freq: Four times a day (QID) | ORAL | Status: DC | PRN
  Administered 2016-02-26 – 2016-03-02 (×14): 2 mg via ORAL
  Filled 2016-02-25 (×15): qty 1

## 2016-02-25 MED ORDER — DOCUSATE SODIUM 100 MG PO CAPS
100.0000 mg | ORAL_CAPSULE | Freq: Two times a day (BID) | ORAL | Status: DC
  Administered 2016-02-26 – 2016-03-02 (×11): 100 mg via ORAL
  Filled 2016-02-25 (×11): qty 1

## 2016-02-25 MED ORDER — INSULIN ASPART 100 UNIT/ML ~~LOC~~ SOLN
0.0000 [IU] | Freq: Three times a day (TID) | SUBCUTANEOUS | Status: DC
  Administered 2016-02-26: 3 [IU] via SUBCUTANEOUS
  Administered 2016-02-26: 5 [IU] via SUBCUTANEOUS
  Administered 2016-02-26 – 2016-02-29 (×6): 3 [IU] via SUBCUTANEOUS
  Administered 2016-02-29: 2 [IU] via SUBCUTANEOUS
  Administered 2016-03-01: 8 [IU] via SUBCUTANEOUS
  Administered 2016-03-01 – 2016-03-02 (×2): 3 [IU] via SUBCUTANEOUS

## 2016-02-25 MED ORDER — BUPIVACAINE 0.25 % ON-Q PUMP DUAL CATH 300 ML
300.0000 mL | INJECTION | Status: DC
  Filled 2016-02-25: qty 300

## 2016-02-25 MED ORDER — FENTANYL CITRATE (PF) 100 MCG/2ML IJ SOLN
25.0000 ug | INTRAMUSCULAR | Status: DC | PRN
  Administered 2016-02-25: 50 ug via INTRAVENOUS
  Administered 2016-02-25 (×2): 25 ug via INTRAVENOUS
  Administered 2016-02-25: 50 ug via INTRAVENOUS

## 2016-02-25 MED ORDER — MORPHINE SULFATE 2 MG/ML IV SOLN
INTRAVENOUS | Status: DC
  Administered 2016-02-25: 5 mg via INTRAVENOUS
  Administered 2016-02-25 – 2016-02-26 (×2): 1 mg via INTRAVENOUS
  Administered 2016-02-26: 3 mg via INTRAVENOUS
  Administered 2016-02-26: 7 mg via INTRAVENOUS
  Administered 2016-02-26 – 2016-02-27 (×2): 1 mg via INTRAVENOUS
  Administered 2016-02-27: 3 mg via INTRAVENOUS

## 2016-02-25 MED ORDER — HYDRALAZINE HCL 20 MG/ML IJ SOLN
10.0000 mg | INTRAMUSCULAR | Status: DC | PRN
  Administered 2016-02-25: 10 mg via INTRAVENOUS

## 2016-02-25 MED ORDER — KCL IN DEXTROSE-NACL 20-5-0.45 MEQ/L-%-% IV SOLN
INTRAVENOUS | Status: DC
  Administered 2016-02-25: 22:00:00 via INTRAVENOUS
  Filled 2016-02-25 (×2): qty 1000

## 2016-02-25 MED ORDER — ENOXAPARIN SODIUM 40 MG/0.4ML ~~LOC~~ SOLN
40.0000 mg | SUBCUTANEOUS | Status: DC
  Administered 2016-02-26 – 2016-03-02 (×6): 40 mg via SUBCUTANEOUS
  Filled 2016-02-25 (×6): qty 0.4

## 2016-02-25 MED ORDER — EVICEL 5 ML EX KIT
PACK | CUTANEOUS | Status: DC | PRN
  Administered 2016-02-25: 1

## 2016-02-25 MED ORDER — LIDOCAINE HCL (PF) 1 % IJ SOLN
INTRAMUSCULAR | Status: AC
  Filled 2016-02-25: qty 30

## 2016-02-25 MED ORDER — MELOXICAM 7.5 MG PO TABS
7.5000 mg | ORAL_TABLET | Freq: Every day | ORAL | Status: DC
  Administered 2016-02-26 – 2016-03-02 (×6): 7.5 mg via ORAL
  Filled 2016-02-25 (×6): qty 1

## 2016-02-25 MED ORDER — LIDOCAINE 2% (20 MG/ML) 5 ML SYRINGE
INTRAMUSCULAR | Status: AC
  Filled 2016-02-25: qty 5

## 2016-02-25 MED ORDER — LIDOCAINE 2% (20 MG/ML) 5 ML SYRINGE
INTRAMUSCULAR | Status: DC | PRN
  Administered 2016-02-25: 80 mg via INTRAVENOUS

## 2016-02-25 MED ORDER — PANTOPRAZOLE SODIUM 40 MG IV SOLR
40.0000 mg | Freq: Every day | INTRAVENOUS | Status: DC
  Administered 2016-02-25 – 2016-02-27 (×3): 40 mg via INTRAVENOUS
  Filled 2016-02-25 (×4): qty 40

## 2016-02-25 MED ORDER — BUPIVACAINE-EPINEPHRINE (PF) 0.25% -1:200000 IJ SOLN
INTRAMUSCULAR | Status: AC
  Filled 2016-02-25: qty 30

## 2016-02-25 MED ORDER — SODIUM CHLORIDE 0.9% FLUSH: 9.0000 mL | INTRAVENOUS | Status: DC | PRN

## 2016-02-25 MED ORDER — LABETALOL HCL 5 MG/ML IV SOLN
INTRAVENOUS | Status: DC | PRN
  Administered 2016-02-25 (×2): 2.5 mg via INTRAVENOUS
  Administered 2016-02-25: 5 mg via INTRAVENOUS

## 2016-02-25 MED ORDER — ONDANSETRON 4 MG PO TBDP: 4.0000 mg | ORAL_TABLET | Freq: Four times a day (QID) | ORAL | Status: DC | PRN

## 2016-02-25 MED ORDER — HYDRALAZINE HCL 20 MG/ML IJ SOLN
INTRAMUSCULAR | Status: AC
  Filled 2016-02-25: qty 1

## 2016-02-25 MED ORDER — BUPIVACAINE 0.25 % ON-Q PUMP SINGLE CATH 300ML
INJECTION | Status: DC | PRN
  Administered 2016-02-25: 300 mL

## 2016-02-25 MED ORDER — EVICEL 5 ML EX KIT
PACK | CUTANEOUS | Status: AC
  Filled 2016-02-25: qty 1

## 2016-02-25 MED ORDER — CHLORHEXIDINE GLUCONATE CLOTH 2 % EX PADS: 6.0000 | MEDICATED_PAD | Freq: Once | CUTANEOUS | Status: DC

## 2016-02-25 MED ORDER — LACTATED RINGERS IV SOLN
INTRAVENOUS | Status: DC
  Administered 2016-02-25 (×3): via INTRAVENOUS

## 2016-02-25 MED ORDER — FENTANYL CITRATE (PF) 100 MCG/2ML IJ SOLN
INTRAMUSCULAR | Status: AC
  Filled 2016-02-25: qty 2

## 2016-02-25 MED ORDER — PROPOFOL 10 MG/ML IV BOLUS
INTRAVENOUS | Status: AC
  Filled 2016-02-25: qty 20

## 2016-02-25 MED ORDER — SCOPOLAMINE 1 MG/3DAYS TD PT72
1.0000 | MEDICATED_PATCH | TRANSDERMAL | Status: DC
  Administered 2016-02-25 – 2016-02-28 (×2): 1.5 mg via TRANSDERMAL
  Filled 2016-02-25: qty 1

## 2016-02-25 MED ORDER — ONDANSETRON HCL 4 MG/2ML IJ SOLN: 4.0000 mg | Freq: Four times a day (QID) | INTRAMUSCULAR | Status: DC | PRN

## 2016-02-25 MED ORDER — DIPHENHYDRAMINE HCL 12.5 MG/5ML PO ELIX: 12.5000 mg | ORAL_SOLUTION | Freq: Four times a day (QID) | ORAL | Status: DC | PRN

## 2016-02-25 MED ORDER — METRONIDAZOLE IN NACL 5-0.79 MG/ML-% IV SOLN
500.0000 mg | INTRAVENOUS | Status: AC
  Administered 2016-02-25: 500 mg via INTRAVENOUS
  Filled 2016-02-25: qty 100

## 2016-02-25 MED ORDER — ROCURONIUM BROMIDE 10 MG/ML (PF) SYRINGE
PREFILLED_SYRINGE | INTRAVENOUS | Status: DC | PRN
  Administered 2016-02-25: 10 mg via INTRAVENOUS
  Administered 2016-02-25: 50 mg via INTRAVENOUS

## 2016-02-25 MED ORDER — FENTANYL CITRATE (PF) 100 MCG/2ML IJ SOLN
INTRAMUSCULAR | Status: DC | PRN
  Administered 2016-02-25: 100 ug via INTRAVENOUS
  Administered 2016-02-25: 50 ug via INTRAVENOUS
  Administered 2016-02-25 (×2): 100 ug via INTRAVENOUS
  Administered 2016-02-25 (×3): 50 ug via INTRAVENOUS

## 2016-02-25 MED ORDER — NEOSTIGMINE METHYLSULFATE 5 MG/5ML IV SOSY
PREFILLED_SYRINGE | INTRAVENOUS | Status: AC
  Filled 2016-02-25: qty 5

## 2016-02-25 MED ORDER — LABETALOL HCL 5 MG/ML IV SOLN
INTRAVENOUS | Status: AC
Start: 2016-02-25 — End: 2016-02-25
  Filled 2016-02-25: qty 4

## 2016-02-25 MED ORDER — MIDAZOLAM HCL 2 MG/2ML IJ SOLN
INTRAMUSCULAR | Status: AC
  Filled 2016-02-25: qty 2

## 2016-02-25 MED ORDER — DEXAMETHASONE SODIUM PHOSPHATE 10 MG/ML IJ SOLN
INTRAMUSCULAR | Status: AC
  Filled 2016-02-25: qty 1

## 2016-02-25 MED ORDER — LIDOCAINE HCL 1 % IJ SOLN
INTRAMUSCULAR | Status: DC | PRN
  Administered 2016-02-25: 7 mL

## 2016-02-25 MED ORDER — SIMETHICONE 80 MG PO CHEW: 40.0000 mg | CHEWABLE_TABLET | Freq: Four times a day (QID) | ORAL | Status: DC | PRN

## 2016-02-25 MED ORDER — 0.9 % SODIUM CHLORIDE (POUR BTL) OPTIME
TOPICAL | Status: DC | PRN
  Administered 2016-02-25 (×2): 1000 mL

## 2016-02-25 MED ORDER — BUPIVACAINE ON-Q PAIN PUMP (FOR ORDER SET NO CHG)
INJECTION | Status: DC
  Filled 2016-02-25: qty 1

## 2016-02-25 MED ORDER — PROPOFOL 10 MG/ML IV BOLUS
INTRAVENOUS | Status: DC | PRN
  Administered 2016-02-25: 90 mg via INTRAVENOUS

## 2016-02-25 MED ORDER — ONDANSETRON HCL 4 MG/2ML IJ SOLN
INTRAMUSCULAR | Status: DC | PRN
  Administered 2016-02-25 (×2): 4 mg via INTRAVENOUS

## 2016-02-25 MED ORDER — ROCURONIUM BROMIDE 10 MG/ML (PF) SYRINGE
PREFILLED_SYRINGE | INTRAVENOUS | Status: AC
  Filled 2016-02-25: qty 10

## 2016-02-25 MED ORDER — DEXAMETHASONE SODIUM PHOSPHATE 10 MG/ML IJ SOLN
INTRAMUSCULAR | Status: DC | PRN
  Administered 2016-02-25: 8 mg via INTRAVENOUS

## 2016-02-25 MED ORDER — METOCLOPRAMIDE HCL 5 MG/ML IJ SOLN
INTRAMUSCULAR | Status: AC
  Filled 2016-02-25: qty 2

## 2016-02-25 SURGICAL SUPPLY — 120 items
ADH SKN CLS APL DERMABOND .7 (GAUZE/BANDAGES/DRESSINGS) ×1
APL SRG 32X5 SNPLK LF DISP (MISCELLANEOUS) ×1
APPLIER CLIP 5 13 M/L LIGAMAX5 (MISCELLANEOUS) ×3
APR CLP MED LRG 5 ANG JAW (MISCELLANEOUS) ×1
BAG BILE T-TUBES STRL (MISCELLANEOUS) ×2 IMPLANT
BAG DRN 9.5 2 ADJ BELT ADPR (MISCELLANEOUS) ×1
BIOPATCH RED 1 DISK 7.0 (GAUZE/BANDAGES/DRESSINGS) ×1 IMPLANT
BIOPATCH RED 1IN DISK 7.0MM (GAUZE/BANDAGES/DRESSINGS) ×1
BLADE SURG ROTATE 9660 (MISCELLANEOUS) IMPLANT
CANISTER SUCTION 2500CC (MISCELLANEOUS) ×3 IMPLANT
CATH KIT ON Q 5IN DUAL SLV (PAIN MANAGEMENT) IMPLANT
CATH KIT ON Q 7.5IN SLV (PAIN MANAGEMENT) IMPLANT
CATH KIT ON-Q SILVERSOAK 5 (CATHETERS) IMPLANT
CATH KIT ON-Q SILVERSOAK 5IN (CATHETERS) ×6 IMPLANT
CATH ROBINSON RED A/P 14FR (CATHETERS) IMPLANT
CATH ROBINSON RED A/P 16FR (CATHETERS) IMPLANT
CHLORAPREP W/TINT 26ML (MISCELLANEOUS) ×3 IMPLANT
CLIP APPLIE 5 13 M/L LIGAMAX5 (MISCELLANEOUS) ×1 IMPLANT
CLIP LIGATING HEM O LOK PURPLE (MISCELLANEOUS) IMPLANT
CLIP LIGATING HEMO O LOK GREEN (MISCELLANEOUS) IMPLANT
CLIP LIGATING HEMOLOK MED (MISCELLANEOUS) IMPLANT
CLIP TI LARGE 6 (CLIP) IMPLANT
CLIP TI MEDIUM 24 (CLIP) IMPLANT
CLIP TI WIDE RED SMALL 24 (CLIP) IMPLANT
CLOSURE STERI-STRIP 1/2X4 (GAUZE/BANDAGES/DRESSINGS) ×1
CLSR STERI-STRIP ANTIMIC 1/2X4 (GAUZE/BANDAGES/DRESSINGS) ×1 IMPLANT
COVER SURGICAL LIGHT HANDLE (MISCELLANEOUS) ×3 IMPLANT
DERMABOND ADVANCED (GAUZE/BANDAGES/DRESSINGS) ×2
DERMABOND ADVANCED .7 DNX12 (GAUZE/BANDAGES/DRESSINGS) IMPLANT
DRAIN CHANNEL 19F RND (DRAIN) ×2 IMPLANT
DRAIN PENROSE 1/2X36 STERILE (WOUND CARE) IMPLANT
DRAPE LAPAROSCOPIC ABDOMINAL (DRAPES) ×1 IMPLANT
DRAPE UTILITY XL STRL (DRAPES) ×2 IMPLANT
DRAPE WARM FLUID 44X44 (DRAPE) ×3 IMPLANT
DRSG COVADERM 4X10 (GAUZE/BANDAGES/DRESSINGS) IMPLANT
DRSG COVADERM 4X14 (GAUZE/BANDAGES/DRESSINGS) IMPLANT
DRSG PAD ABDOMINAL 8X10 ST (GAUZE/BANDAGES/DRESSINGS) IMPLANT
DRSG TEGADERM 2-3/8X2-3/4 SM (GAUZE/BANDAGES/DRESSINGS) ×2 IMPLANT
ELECT BLADE 6.5 EXT (BLADE) IMPLANT
ELECT CAUTERY BLADE 6.4 (BLADE) ×3 IMPLANT
ELECT REM PT RETURN 9FT ADLT (ELECTROSURGICAL) ×3
ELECTRODE REM PT RTRN 9FT ADLT (ELECTROSURGICAL) ×1 IMPLANT
EVACUATOR SILICONE 100CC (DRAIN) IMPLANT
GAUZE SPONGE 4X4 12PLY STRL (GAUZE/BANDAGES/DRESSINGS) IMPLANT
GAUZE SPONGE 4X4 16PLY XRAY LF (GAUZE/BANDAGES/DRESSINGS) ×3 IMPLANT
GEL PDS (MISCELLANEOUS) ×3 IMPLANT
GLOVE BIO SURGEON STRL SZ 6 (GLOVE) ×3 IMPLANT
GLOVE BIO SURGEON STRL SZ7 (GLOVE) ×4 IMPLANT
GLOVE BIO SURGEON STRL SZ7.5 (GLOVE) ×2 IMPLANT
GLOVE BIOGEL PI IND STRL 6.5 (GLOVE) ×1 IMPLANT
GLOVE BIOGEL PI IND STRL 7.0 (GLOVE) IMPLANT
GLOVE BIOGEL PI IND STRL 7.5 (GLOVE) ×1 IMPLANT
GLOVE BIOGEL PI INDICATOR 6.5 (GLOVE)
GLOVE BIOGEL PI INDICATOR 7.0 (GLOVE) ×2
GLOVE BIOGEL PI INDICATOR 7.5 (GLOVE) ×2
GLOVE INDICATOR 6.5 STRL GRN (GLOVE) ×3 IMPLANT
GOWN STRL REUS W/ TWL LRG LVL3 (GOWN DISPOSABLE) ×3 IMPLANT
GOWN STRL REUS W/TWL 2XL LVL3 (GOWN DISPOSABLE) ×3 IMPLANT
GOWN STRL REUS W/TWL LRG LVL3 (GOWN DISPOSABLE) ×6
KIT BASIN OR (CUSTOM PROCEDURE TRAY) ×3 IMPLANT
KIT ROOM TURNOVER OR (KITS) ×3 IMPLANT
L-HOOK LAP DISP 36CM (ELECTROSURGICAL) ×3
LHOOK LAP DISP 36CM (ELECTROSURGICAL) ×1 IMPLANT
NS IRRIG 1000ML POUR BTL (IV SOLUTION) ×5 IMPLANT
PACK LAPAROSCOPIC ABD 0248 (SET/KITS/TRAYS/PACK) ×1 IMPLANT
PAD ARMBOARD 7.5X6 YLW CONV (MISCELLANEOUS) ×8 IMPLANT
PENCIL BUTTON HOLSTER BLD 10FT (ELECTRODE) ×4 IMPLANT
POUCH ENDO CATCH II 15MM (MISCELLANEOUS) IMPLANT
RELOAD STAPLE 60 2.6 WHT THN (STAPLE) IMPLANT
RELOAD STAPLER BLUE 60MM (STAPLE) ×2 IMPLANT
RELOAD STAPLER WHITE 60MM (STAPLE) ×1 IMPLANT
RELOAD WHITE ECR60W (STAPLE) ×4 IMPLANT
SCALPEL HARMONIC ACE (MISCELLANEOUS) ×3 IMPLANT
SCISSORS HARMONIC WAVE 18CM (INSTRUMENTS) IMPLANT
SCISSORS LAP 5X35 DISP (ENDOMECHANICALS) ×2 IMPLANT
SEALANT SURGICAL APPL DUAL CAN (MISCELLANEOUS) ×3 IMPLANT
SET IRRIG TUBING LAPAROSCOPIC (IRRIGATION / IRRIGATOR) ×5 IMPLANT
SLEEVE ENDOPATH XCEL 5M (ENDOMECHANICALS) ×9 IMPLANT
SLEEVE SURGEON STRL (DRAPES) ×3 IMPLANT
SPONGE INTESTINAL PEANUT (DISPOSABLE) IMPLANT
STAPLE ECHEON FLEX 60 POW ENDO (STAPLE) ×3 IMPLANT
STAPLER RELOAD BLUE 60MM (STAPLE) ×6
STAPLER RELOAD WHITE 60MM (STAPLE) ×3
STAPLER VISISTAT 35W (STAPLE) ×1 IMPLANT
STRIP PERI DRY VERITAS 60 (STAPLE) ×4 IMPLANT
SUT ETHILON 2 0 FS 18 (SUTURE) ×2 IMPLANT
SUT MNCRL AB 4-0 PS2 18 (SUTURE) ×3 IMPLANT
SUT PDS AB 1 CT  36 (SUTURE) ×4
SUT PDS AB 1 CT 36 (SUTURE) IMPLANT
SUT PDS AB 1 TP1 96 (SUTURE) IMPLANT
SUT PDS AB 3-0 SH 27 (SUTURE) IMPLANT
SUT PDS II 0 TP-1 LOOPED 60 (SUTURE) IMPLANT
SUT PROLENE 3 0 SH 48 (SUTURE) IMPLANT
SUT PROLENE 4 0 RB 1 (SUTURE)
SUT PROLENE 4 0 SH DA (SUTURE) IMPLANT
SUT PROLENE 4-0 RB1 .5 CRCL 36 (SUTURE) IMPLANT
SUT PROLENE 5 0 C1 (SUTURE) IMPLANT
SUT SILK 2 0 SH CR/8 (SUTURE) ×1 IMPLANT
SUT SILK 2 0 TIES 10X30 (SUTURE) IMPLANT
SUT SILK 3 0 SH CR/8 (SUTURE) ×1 IMPLANT
SUT SILK 3 0 TIES 10X30 (SUTURE) ×1 IMPLANT
SYS LAPSCP GELPORT 120MM (MISCELLANEOUS) ×3
SYSTEM LAPSCP GELPORT 120MM (MISCELLANEOUS) IMPLANT
TIP RIGID 35CM EVICEL (HEMOSTASIS) ×2 IMPLANT
TOWEL OR 17X24 6PK STRL BLUE (TOWEL DISPOSABLE) ×1 IMPLANT
TOWEL OR 17X26 10 PK STRL BLUE (TOWEL DISPOSABLE) ×3 IMPLANT
TRAY FOLEY CATH 14FRSI W/METER (CATHETERS) ×3 IMPLANT
TRAY LAPAROSCOPIC MC (CUSTOM PROCEDURE TRAY) ×3 IMPLANT
TROCAR BLADELESS 15MM (ENDOMECHANICALS) ×1 IMPLANT
TROCAR XCEL 12X100 BLDLESS (ENDOMECHANICALS) ×3 IMPLANT
TROCAR XCEL BLUNT TIP 100MML (ENDOMECHANICALS) IMPLANT
TROCAR XCEL NON-BLD 11X100MML (ENDOMECHANICALS) IMPLANT
TROCAR XCEL NON-BLD 5MMX100MML (ENDOMECHANICALS) ×3 IMPLANT
TUBE CONNECTING 12'X1/4 (SUCTIONS) ×1
TUBE CONNECTING 12X1/4 (SUCTIONS) ×2 IMPLANT
TUBING FILTER THERMOFLATOR (ELECTROSURGICAL) ×1 IMPLANT
TUBING INSUFFLATION (TUBING) ×3 IMPLANT
TUNNELER SHEATH ON-Q 11GX8 DSP (PAIN MANAGEMENT) ×3 IMPLANT
TUNNELER SHEATH ON-Q 16GX12 DP (PAIN MANAGEMENT) ×2 IMPLANT
YANKAUER SUCT BULB TIP NO VENT (SUCTIONS) ×1 IMPLANT

## 2016-02-25 NOTE — Interval H&P Note (Signed)
History and Physical Interval Note:  02/25/2016 12:53 PM  Tracy Williams  has presented today for surgery, with the diagnosis of PANCREATIC MASS  The various methods of treatment have been discussed with the patient and family. After consideration of risks, benefits and other options for treatment, the patient has consented to  Procedure(s): LAPAROSCOPIC DISTAL PANCREATECTOMY (N/A) LAPAROSCOPIC SPLENECTOMY (N/A) as a surgical intervention .  The patient's history has been reviewed, patient examined, no change in status, stable for surgery.  I have reviewed the patient's chart and labs.  Questions were answered to the patient's satisfaction.     [name]

## 2016-02-25 NOTE — Anesthesia Preprocedure Evaluation (Addendum)
Anesthesia Evaluation  Patient identified by MRN, date of birth, ID band Patient awake    Reviewed: Allergy & Precautions, H&P , Patient's Chart, lab work & pertinent test results, reviewed documented beta blocker date and time   History of Anesthesia Complications (+) PONV  Airway Mallampati: II  TM Distance: >3 FB Neck ROM: full    Dental no notable dental hx.    Pulmonary    Pulmonary exam normal breath sounds clear to auscultation       Cardiovascular hypertension,  Rhythm:regular Rate:Normal     Neuro/Psych    GI/Hepatic GERD  ,  Endo/Other  diabetes  Renal/GU      Musculoskeletal   Abdominal   Peds  Hematology   Anesthesia Other Findings   Reproductive/Obstetrics                            Anesthesia Physical Anesthesia Plan  ASA: II  Anesthesia Plan: General   Post-op Pain Management:    Induction: Intravenous  Airway Management Planned: Oral ETT  Additional Equipment: Arterial line  Intra-op Plan:   Post-operative Plan: Extubation in OR  Informed Consent: I have reviewed the patients History and Physical, chart, labs and discussed the procedure including the risks, benefits and alternatives for the proposed anesthesia with the patient or authorized representative who has indicated his/her understanding and acceptance.   Dental Advisory Given and Dental advisory given  Plan Discussed with: CRNA and Surgeon  Anesthesia Plan Comments: (  Discussed general anesthesia, including possible nausea, instrumentation of airway, sore throat,pulmonary aspiration, etc. I asked if the were any outstanding questions, or  concerns before we proceeded. )       Anesthesia Quick Evaluation

## 2016-02-25 NOTE — Transfer of Care (Signed)
Immediate Anesthesia Transfer of Care Note  Patient: Tracy Williams  Procedure(s) Performed: Procedure(s): LAPAROSCOPIC DISTAL PANCREATECTOMY (N/A) LAPAROSCOPIC SPLENECTOMY (N/A)  Patient Location: PACU  Anesthesia Type:General  Level of Consciousness: awake, alert , oriented and patient cooperative  Airway & Oxygen Therapy: Patient Spontanous Breathing and Patient connected to nasal cannula oxygen  Post-op Assessment: Report given to RN, Post -op Vital signs reviewed and stable and Patient moving all extremities  Post vital signs: Reviewed and stable  Last Vitals:  Vitals:   02/25/16 1116  BP: 139/66  Pulse: 94  Resp: 20  Temp: 37 C    Last Pain:  Vitals:   02/25/16 1130  PainSc: 2       Patients Stated Pain Goal: 2 ([id number])  Complications: No apparent anesthesia complications

## 2016-02-25 NOTE — Interval H&P Note (Signed)
History and Physical Interval Note:  02/25/2016 12:53 PM  Tracy Williams  has presented today for surgery, with the diagnosis of PANCREATIC MASS  The various methods of treatment have been discussed with the patient and family. After consideration of risks, benefits and other options for treatment, the patient has consented to  Procedure(s): LAPAROSCOPIC DISTAL PANCREATECTOMY (N/A) LAPAROSCOPIC SPLENECTOMY (N/A) as a surgical intervention .  The patient's history has been reviewed, patient examined, no change in status, stable for surgery.  I have reviewed the patient's chart and labs.  Questions were answered to the patient's satisfaction.     Marris Frontera

## 2016-02-25 NOTE — Op Note (Signed)
PREOPERATIVE DIAGNOSIS:  Cystic pancreatic lesion     POSTOPERATIVE DIAGNOSIS:  Same      PROCEDURE:  Diagnostic laparoscopy, laparoscopic hand-assisted distal   pancreatectomy and splenectomy.      SURGEON:  Stark Klein, MD      ASSISTANT:  Fanny Skates, M.D.      ANESTHESIA:  General and local.      FINDINGS:  Mass adherent to splenic vein     SPECIMEN:  Distal pancreas and spleen to Pathology.      ESTIMATED BLOOD LOSS: 75  mL      COMPLICATIONS:  None known.      PROCEDURE:  Patient was identified in the holding area and taken to   operating room where she was placed supine on the operating room table.   General anesthesia was induced.  Foley catheter was placed.  She was   placed in the leaning spleen position.  Her abdomen was prepped and   draped in sterile fashion.  Time-out was performed according to surgical   safety check list.  When all was correct, we continued.    An upper midline incision was made approximately 7-8 cm in length just below the xiphoid.  The subcutaneous tissues were divided with the cautery.  The midline fascia was identified and opened with the cautery.  The peritoneum was identified and opened sharply.  The peritoneal incision was opened the length of the fascial incision.  The gelport was inserted and the cap applied.  A trocar was inserted through the gel port and pneumoperitoneum was achieved.       There were some LUQ adhesions.  A 5 mm port was placed in the left abdomen.  Sharp dissection was used to take down the omental adhesions.  Two additional 5 mm trocars were placed into the abdominal wall.  The lienocolic ligament was   taken down with the Harmonic.  The lesser sac was opened with the   Harmonic and all the adhesions were taken down.  Once the   stomach was completely off the spleen, the inferior border of the   pancreas was identified and this was opened up with the Harmonic.   The pancreas and the splenic artery and vein were  elevated.    The cystic mass was identified in the distal body of the pancreas. The pancreas proximal to the mass was then isolated off  the vessels.  A 12 mm port was placed through the gel port.  The splenic artery and vein were divided with a vascular load of the Echelon stapler.  The pancreas was then divided with the blue load with peristrips, taking care to close the stapler down slowly.  The posterior attachments were then taken   off with the Harmonic scalpel and the spleen was disconnected from the   posterior attachments.  There was a portion of the stomach that was adherent to the mass and could not be dissected away.  This was divided with the blue load.  The specimen was then removed from the hand   port.  The area was inspected for hemostasis.  No bleeding was seen at the staple line or elsewhere.    The LUQ was irrigated and there was   no sign of any additional bleeding.  Evicel was placed over the stump of   the pancreas.  The 19 Blake drain was then passed into the abdomen through   the hand port and pulled out through one of the other 5  mm trocars.   This was placed in the appropriate location and secured with a 2-0 nylon.    At this point, the other 5 mm trocars were removed and the fascia from the hand port was closed with running #1 PDS sutures.  The skin of all the incisions was   then closed with 4-0 Monocryl in a subcuticular fashion and then dressed   with benzoin, Steri-Strips, and soft dressings.  The patient tolerated   the procedure well.  She was extubated and taken to the PACU in stable   condition.  Needle, sponge, and instrument counts were correct x2               Stark Klein, MD

## 2016-02-25 NOTE — Anesthesia Procedure Notes (Signed)
Procedure Name: Intubation Date/Time: 02/25/2016 1:32 PM Performed by: Myna Bright Pre-anesthesia Checklist: Patient identified, Emergency Drugs available, Suction available and Patient being monitored Patient Re-evaluated:Patient Re-evaluated prior to inductionOxygen Delivery Method: Circle system utilized Preoxygenation: Pre-oxygenation with 100% oxygen Intubation Type: IV induction Ventilation: Mask ventilation without difficulty Laryngoscope Size: Mac and 3 Grade View: Grade II Tube type: Oral Tube size: 7.5 mm Number of attempts: 1 Airway Equipment and Method: Stylet Placement Confirmation: ETT inserted through vocal cords under direct vision,  positive ETCO2 and breath sounds checked- equal and bilateral Secured at: 21 cm Tube secured with: Tape Dental Injury: Teeth and Oropharynx as per pre-operative assessment

## 2016-02-25 NOTE — H&P (Signed)
Tracy Williams  Location: [facility] Patient #: [id number] DOB: 1946/01/27 Married / Language: English / Race: White Female   History of Present Illness  The patient is a 70 year old female who presents with a pancreatic mass. Patient is a 70 year old female referred by Dr. Paulita Fujita for a pancreatic cyst. The patient presented to the hospital in late August with sudden onset of nausea vomiting and abdominal pain. She also noted that the pain radiated to her left shoulder. She had a gallbladder ultrasound which was negative for stones and then a CT showing a distal pancreatic mass. This is followed by an MRI and this was seen to be approximately 4 cm in size. She was referred for outpatient endoscopic ultrasound. Endoscopic ultrasound demonstrated a multicystic septated lesion in the pancreatic body. There is not definitive communication with the duct. There were several thick septations and mural nodules present. FNA was performed which did not show any malignant cells. Fluid was analyzed in the CEA in the fluid was 2563. Amylase was [place] [address]. She is referred for resection.  CT 11/09/15 IMPRESSION: 1. Large minimally heterogeneous mass at the distal body of the pancreas, measuring 4.7 x 3.7 x 4.0 cm, with minimal surrounding soft tissue inflammation. Suggestion of surrounding angiogenesis. This is concerning for primary pancreatic malignancy. Would correlate with LFTs, and recommend MRCP for further evaluation. 2. Scattered calcification along the abdominal aorta and its branches. 3. Uterine fibroid noted. 4. Bibasilar atelectasis or scarring seen. 5. Small hiatal hernia noted.  MRI 11/09/15 IMPRESSION: 1. Complex cystic pancreatic body lesion, incompletely characterized on this noncontrast exam. Favor complex pseudocyst. Cystic neoplasm, including mucinous serous cystadenocarcinoma or oligocystic serous cystadenoma could look similar. Consider  sampling via endoscopic ultrasound. 2. The cystic lesion is intimately associated with the a greater curvature of the stomach, without gross fistulous communication identified. There is also edema adjacent the greater curvature of the stomach. Recommend clinical exclusion of mild superimposed acute pancreatitis. 3. Trace abdominal ascites. 4. Tiny hiatal hernia. 5. Right renal 1 cm lesion is technically indeterminate but most likely a hemorrhagic/proteinaceous cyst. Consider renal ultrasound follow-up at 1 year to confirm stability  Labs> LFTs elevated in August, but normalized by september. Glucose elevated at 165 CBC normal  pathology FINE NEEDLE ASPIRATION, ENDOSCOPIC, PANCREAS CYST FLUID, BODY (SPECIMEN 1 OF 1 COLLECTED 12/24/15): NO MALIGNANT CELLS IDENTIFIED. FINDINGS CONSISTENT WITH THE CONTENTS OF A CYST.   EUS Endosonographic Finding Findings: There was no sign of significant endosonographic abnormality in the gallbladder. There was no sign of significant endosonographic abnormality in the left lobe of the liver. An anechoic, multicystic and septated lesion suggestive of a cyst was identified in the pancreatic body. It is not in obvious communication with the pancreatic duct. The lesion measured 40 mm by 40 mm in maximal cross-sectional diameter. There were a few compartments thickly septated. The outer wall of the lesion was thick. There were associated mural nodules within the wall. There was internal debris within the fluid-filled cavity. Diagnostic needle aspiration for fluid was performed. Color Doppler imaging was utilized prior to needle puncture to confirm a lack of significant vascular structures within the needle path. One pass was made with the 22 gauge needle using a transgastric approach. A stylet was used. The amount of fluid collected was 5 mL. The fluid was clear, white and viscous. Sample(s) were sent for amylase concentration, cytology and  CEA.    Other Problems  Arthritis Atrial Fibrillation Back  Pain Diabetes Mellitus Gastroesophageal Reflux Disease General anesthesia - complications High blood pressure Hypercholesterolemia Pancreatitis  Past Surgical History  Cesarean Section - 1 Colon Polyp Removal - Colonoscopy Knee Surgery Bilateral.  Diagnostic Studies History  Colonoscopy 1-5 years ago Mammogram within last year Pap Smear >5 years ago  Allergies  Darvocet A500 *ANALGESICS - OPIOID* Demerol *ANALGESICS - OPIOID* HydroCHLOROthiazide *DIURETICS* Percocet *ANALGESICS - OPIOID* Vicodin *ANALGESICS - OPIOID*  Medication History  Atorvastatin Calcium (20MG  Tablet, Oral) Active. GlipiZIDE ER (5MG  Tablet ER 24HR, Oral) Active. Januvia (100MG  Tablet, Oral) Active. Lisinopril (10MG  Tablet, Oral) Active. Meloxicam (7.5MG  Tablet, Oral) Active. TraMADol HCl (50MG  Tablet, Oral as needed) Active. Medications Reconciled  Social History  Caffeine use Carbonated beverages, Tea. No alcohol use No drug use Tobacco use Never smoker.  Family History Breast Cancer Mother. Diabetes Mellitus Father.  Pregnancy / Birth History Age at menarche 21 years. Age of menopause 51-55 Contraceptive History Oral contraceptives. Gravida 1 Maternal age 23-30 Para 1    Review of Systems General Present- Fatigue. Not Present- Appetite Loss, Chills, Fever, Night Sweats, Weight Gain and Weight Loss. Skin Present- Dryness. Not Present- Change in Wart/Mole, Hives, Jaundice, New Lesions, Non-Healing Wounds, Rash and Ulcer. HEENT Present- Ringing in the Ears and Wears glasses/contact lenses. Not Present- Earache, Hearing Loss, Hoarseness, Nose Bleed, Oral Ulcers, Seasonal Allergies, Sinus Pain, Sore Throat, Visual Disturbances and Yellow Eyes. Respiratory Not Present- Bloody sputum, Chronic Cough, Difficulty Breathing, Snoring and Wheezing. Breast Not Present- Breast Mass, Breast Pain,  Nipple Discharge and Skin Changes. Cardiovascular Present- Palpitations and Swelling of Extremities. Not Present- Chest Pain, Difficulty Breathing Lying Down, Leg Cramps, Rapid Heart Rate and Shortness of Breath. Gastrointestinal Present- Abdominal Pain. Not Present- Bloating, Bloody Stool, Change in Bowel Habits, Chronic diarrhea, Constipation, Difficulty Swallowing, Excessive gas, Gets full quickly at meals, Hemorrhoids, Indigestion, Nausea, Rectal Pain and Vomiting. Female Genitourinary Present- Frequency and Urgency. Not Present- Nocturia, Painful Urination and Pelvic Pain. Musculoskeletal Present- Back Pain. Not Present- Joint Pain, Joint Stiffness, Muscle Pain, Muscle Weakness and Swelling of Extremities. Neurological Not Present- Decreased Memory, Fainting, Headaches, Numbness, Seizures, Tingling, Tremor, Trouble walking and Weakness. Psychiatric Present- Frequent crying. Not Present- Anxiety, Bipolar, Change in Sleep Pattern, Depression and Fearful. Endocrine Not Present- Cold Intolerance, Excessive Hunger, Hair Changes, Heat Intolerance, Hot flashes and New Diabetes. Hematology Not Present- Blood Thinners, Easy Bruising, Excessive bleeding, Gland problems, HIV and Persistent Infections.  Vitals  Weight: 169 lb Height: 65in Body Surface Area: 1.84 m Body Mass Index: 28.12 kg/m  Temp.: 68F(Temporal)  Pulse: 76 (Regular)  BP: 124/80 (Sitting, Left Arm, Standard)       Physical Exam  General Mental Status-Alert. General Appearance-Consistent with stated age. Hydration-Well hydrated. Voice-Normal.  Head and Neck Head-normocephalic, atraumatic with no lesions or palpable masses. Trachea-midline. Thyroid Gland Characteristics - normal size and consistency.  Eye Eyeball - Bilateral-Extraocular movements intact. Sclera/Conjunctiva - Bilateral-No scleral icterus.  Chest and Lung Exam Chest and lung exam reveals -quiet, even and easy respiratory  effort with no use of accessory muscles and on auscultation, normal breath sounds, no adventitious sounds and normal vocal resonance. Inspection Chest Wall - Normal. Back - normal.  Cardiovascular Cardiovascular examination reveals -normal heart sounds, regular rate and rhythm with no murmurs and normal pedal pulses bilaterally.  Abdomen Inspection Inspection of the abdomen reveals - No Hernias. Palpation/Percussion Palpation and Percussion of the abdomen reveal - Soft, Non Tender, No Rebound tenderness, No Rigidity (guarding) and No hepatosplenomegaly. Auscultation Auscultation of the abdomen reveals -  Bowel sounds normal.  Neurologic Neurologic evaluation reveals -alert and oriented x 3 with no impairment of recent or remote memory. Mental Status-Normal.  Musculoskeletal Global Assessment -Note: no gross deformities.  Normal Exam - Left-Upper Extremity Strength Normal and Lower Extremity Strength Normal. Normal Exam - Right-Upper Extremity Strength Normal and Lower Extremity Strength Normal.  Lymphatic Head & Neck  General Head & Neck Lymphatics: Bilateral - Description - Normal. Axillary  General Axillary Region: Bilateral - Description - Normal. Tenderness - Non Tender. Femoral & Inguinal  Generalized Femoral & Inguinal Lymphatics: Bilateral - Description - No Generalized lymphadenopathy.    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 The CEA is extremely elevated and the amylase is also extremely elevated. There is a very small chance that she may have passed a gallstone and had pancreatitis with a resulting pseudocyst. However, she had no gallstones seen on her right upper quadrant ultrasound and has an elevated CEA in the cyst. Given the fact that she continues to have abdominal pain and appears to have a mucinous lesion based on fluid analysis, we will  plan to resect it. Cystic mucinous lesions have somewhere between a 5 and 40% chance of malignant transformation.  I advised the patient that I think her spleen will need to be resected with it because of the lack of a fat plane between the mass and the splenic vessels. I also think she certainly could have a cystic lesion with superimposed pancreatitis. This would make dissection between the pancreas and the splenic vessels extremely difficult.  I have given her prescription for splenic vaccinations. I have discussed with surgery would entail. I would definitely need to do at least a hand-assisted surgery and possibly an open procedure based on the fact that this mass is in the pancreatic body.  I reviewed the risk of bleeding, infection, damage to adjacent structures, possible need for additional surgeries or procedures, pancreatic leak, nausea, prolonged hospitalization, heart or lung complications, blood clot, and death.  The patient would like to do this expeditiously. Current Plans Pt Education - FB pancreatectomy You are being scheduled for surgery - Our schedulers will call you.  You should hear from our office's scheduling department within 5 working days about the location, date, and time of surgery. We try to make accommodations for patient's preferences in scheduling surgery, but sometimes the OR schedule or the surgeon's schedule prevents [place] from making those accommodations.  If you have not heard from our office [phone]) in 5 working days, call the office and ask for your surgeon's nurse.  If you have other questions about your diagnosis, plan, or surgery, call the office and ask for your surgeon's nurse.  DIABETES MELLITUS WITHOUT COMPLICATION ([id number]) Impression: I discussed with the patient that there is quite a significant portion of pancreas that would be removed. I discussed that she is at risk for having to convert to insulin rather than oral medications. I discussed  that we would definitely use insulin while she is in the hospital to control her diabetes.    Signed by Stark Klein, MD

## 2016-02-26 ENCOUNTER — Encounter (HOSPITAL_COMMUNITY): Payer: Self-pay | Admitting: General Surgery

## 2016-02-26 LAB — CBC
HCT: 31 % — ABNORMAL LOW (ref 36.0–46.0)
Hemoglobin: 10.1 g/dL — ABNORMAL LOW (ref 12.0–15.0)
MCH: 27.9 pg (ref 26.0–34.0)
MCHC: 32.6 g/dL (ref 30.0–36.0)
MCV: 85.6 fL (ref 78.0–100.0)
Platelets: 235 10*3/uL (ref 150–400)
RBC: 3.62 MIL/uL — ABNORMAL LOW (ref 3.87–5.11)
RDW: 13.9 % (ref 11.5–15.5)
WBC: 15 10*3/uL — AB (ref 4.0–10.5)

## 2016-02-26 LAB — GLUCOSE, CAPILLARY
GLUCOSE-CAPILLARY: 189 mg/dL — AB (ref 65–99)
GLUCOSE-CAPILLARY: 167 mg/dL — AB (ref 65–99)
Glucose-Capillary: 249 mg/dL — ABNORMAL HIGH (ref 65–99)
Glucose-Capillary: 156 mg/dL — ABNORMAL HIGH (ref 65–99)

## 2016-02-26 LAB — BASIC METABOLIC PANEL
ANION GAP: 10 (ref 5–15)
BUN: 21 mg/dL — ABNORMAL HIGH (ref 6–20)
CALCIUM: 8.4 mg/dL — AB (ref 8.9–10.3)
CO2: 22 mmol/L (ref 22–32)
CREATININE: 1.29 mg/dL — AB (ref 0.44–1.00)
Chloride: 103 mmol/L (ref 101–111)
GFR, EST AFRICAN AMERICAN: 47 mL/min — AB (ref >60)
GFR, EST NON AFRICAN AMERICAN: 41 mL/min — AB (ref >60)
Glucose, Bld: 278 mg/dL — ABNORMAL HIGH (ref 65–99)
Potassium: 5.1 mmol/L (ref 3.5–5.1)
SODIUM: 135 mmol/L (ref 135–145)

## 2016-02-26 MED ORDER — DEXTROSE-NACL 5-0.45 % IV SOLN
INTRAVENOUS | Status: DC
  Administered 2016-02-26 – 2016-02-29 (×7): via INTRAVENOUS

## 2016-02-26 NOTE — Progress Notes (Signed)
1 Day Post-Op  Subjective: Having dizziness or pain.  Morphine makes her nauseated.    Objective: Vital signs in last 24 hours: Temp:  [97.3 F (36.3 C)-98.3 F (36.8 C)] 98.3 F (36.8 C) (12/07 [id number]) Pulse Rate:  [85-117] 103 (12/07 0846) Resp:  [8-20] 17 (12/07 1154) BP: (143-183)/(48-88) 143/48 (12/07 0638) SpO2:  [93 %-100 %] 97 % (12/07 1154) Arterial Line BP: (150-200)/(79-128) 200/128 (12/06 1801)    Intake/Output from previous day: 12/06 0701 - 12/07 0700 In: 3070 [P.O.:320; I.V.:2750] Out: [id number] [Urine:1335; Drains:210; Blood:100] Intake/Output this shift: Total I/O In: 120 [P.O.:120] Out: 300 [Urine:300]  General appearance: alert, cooperative and no distress Resp: breathing comfortably GI: soft, slightly distended upper abdomen, dressing c/d/i.   Extremities: extremities normal, atraumatic, no cyanosis or edema drain serosang  Lab Results:   Recent Labs  02/25/16 2040 02/26/16 0452  WBC 20.9* 15.0*  HGB 12.0 10.1*  HCT 35.8* 31.0*  PLT 220 235   BMET  Recent Labs  02/23/16 1402 02/25/16 2040 02/26/16 0452  NA 137  --  135  K 4.4  --  5.1  CL 103  --  103  CO2 25  --  22  GLUCOSE 106*  --  278*  BUN 33*  --  21*  CREATININE 1.26* 1.15* 1.29*  CALCIUM 9.2  --  8.4*   PT/INR No results for input(s): LABPROT, INR in the last 72 hours. ABG No results for input(s): PHART, HCO3 in the last 72 hours.  Invalid input(s): PCO2, PO2  Studies/Results: No results found.  Anti-infectives: Anti-infectives    Start     Dose/Rate Route Frequency Ordered Stop   02/25/16 2200  ceFAZolin (ANCEF) IVPB 2g/100 mL premix     2 g 200 mL/hr over 30 Minutes Intravenous Every 8 hours 02/25/16 1955 02/25/16 2241   02/25/16 1245  metroNIDAZOLE (FLAGYL) IVPB 500 mg     500 mg 100 mL/hr over 60 Minutes Intravenous On call to O.R. 02/25/16 1119 02/25/16 1349   02/25/16 1119  ceFAZolin (ANCEF) IVPB 2g/100 mL premix     2 g 200 mL/hr over 30 Minutes Intravenous  On call to O.R. 02/25/16 1119 02/25/16 1344      Assessment/Plan: s/p Procedure(s): LAPAROSCOPIC DISTAL PANCREATECTOMY (N/A) LAPAROSCOPIC SPLENECTOMY (N/A) Continue foley due to urinary output monitoring and nausea Try minimizing narcotics.   Already on Mobic, would not add toradol given baseline chronic kidney disease. Add phenergan. Doing standing tylenol, muscle relaxants, OnQ pain pump.   LOS: 1 day    [facility] 02/26/2016

## 2016-02-26 NOTE — Anesthesia Postprocedure Evaluation (Signed)
Anesthesia Post Note  Patient: Tracy Williams  Procedure(s) Performed: Procedure(s) (LRB): LAPAROSCOPIC DISTAL PANCREATECTOMY (N/A) LAPAROSCOPIC SPLENECTOMY (N/A)  Patient location during evaluation: PACU Anesthesia Type: General Level of consciousness: awake and alert Pain management: pain level controlled Vital Signs Assessment: post-procedure vital signs reviewed and stable Respiratory status: spontaneous breathing, nonlabored ventilation, respiratory function stable and patient connected to nasal cannula oxygen Cardiovascular status: blood pressure returned to baseline and stable Postop Assessment: no signs of nausea or vomiting Anesthetic complications: no    Last Vitals:  Vitals:   02/26/16 0225 02/26/16 0638  BP: (!) 165/54 (!) 143/48  Pulse: (!) 117 (!) 111  Resp: 17 18  Temp: 36.8 C 36.8 C    Last Pain:  Vitals:   02/26/16 0638  TempSrc: Oral  PainSc:                  Effie Berkshire

## 2016-02-27 LAB — BASIC METABOLIC PANEL
ANION GAP: 8 (ref 5–15)
BUN: 10 mg/dL (ref 6–20)
CALCIUM: 8.4 mg/dL — AB (ref 8.9–10.3)
CO2: 28 mmol/L (ref 22–32)
Chloride: 98 mmol/L — ABNORMAL LOW (ref 101–111)
Creatinine, Ser: 1 mg/dL (ref 0.44–1.00)
GFR calc Af Amer: >60 mL/min (ref >60)
GFR, EST NON AFRICAN AMERICAN: 56 mL/min — AB (ref >60)
Glucose, Bld: 188 mg/dL — ABNORMAL HIGH (ref 65–99)
POTASSIUM: 4.7 mmol/L (ref 3.5–5.1)
SODIUM: 134 mmol/L — AB (ref 135–145)

## 2016-02-27 LAB — CBC
HCT: 30.7 % — ABNORMAL LOW (ref 36.0–46.0)
Hemoglobin: 9.8 g/dL — ABNORMAL LOW (ref 12.0–15.0)
MCH: 27.8 pg (ref 26.0–34.0)
MCHC: 31.9 g/dL (ref 30.0–36.0)
MCV: 87 fL (ref 78.0–100.0)
PLATELETS: 259 10*3/uL (ref 150–400)
RBC: 3.53 MIL/uL — AB (ref 3.87–5.11)
RDW: 13.9 % (ref 11.5–15.5)
WBC: 24.1 10*3/uL — AB (ref 4.0–10.5)

## 2016-02-27 LAB — GLUCOSE, CAPILLARY
GLUCOSE-CAPILLARY: 189 mg/dL — AB (ref 65–99)
Glucose-Capillary: 167 mg/dL — ABNORMAL HIGH (ref 65–99)
Glucose-Capillary: 179 mg/dL — ABNORMAL HIGH (ref 65–99)
Glucose-Capillary: 162 mg/dL — ABNORMAL HIGH (ref 65–99)

## 2016-02-27 MED ORDER — ALUM & MAG HYDROXIDE-SIMETH 200-200-20 MG/5ML PO SUSP
30.0000 mL | ORAL | Status: DC | PRN
  Administered 2016-02-27: 30 mL via ORAL
  Filled 2016-02-27: qty 30

## 2016-02-27 MED ORDER — MORPHINE SULFATE (PF) 2 MG/ML IV SOLN
0.5000 mg | INTRAVENOUS | Status: DC | PRN
Start: 2016-02-27 — End: 2016-03-02

## 2016-02-27 MED ORDER — PROMETHAZINE HCL 25 MG/ML IJ SOLN: 12.5000 mg | Freq: Four times a day (QID) | INTRAMUSCULAR | Status: DC | PRN

## 2016-02-27 NOTE — Progress Notes (Signed)
2 Days Post-Op  Subjective: Less dizzy today.  Has been out of bed.  Having a fair amount of belching.  No fevers.  Less nausea since she has only taken 2 doses of morphine.    Objective: Vital signs in last 24 hours: Temp:  [98 F (36.7 C)-99.6 F (37.6 C)] 98 F (36.7 C) (12/08 0551) Pulse Rate:  [107-117] 110 (12/08 0551) Resp:  [12-23] 17 (12/08 0728) BP: (114-148)/(54-56) 114/56 (12/08 0551) SpO2:  [92 %-97 %] 95 % (12/08 0728) Last BM Date: 02/25/16  Intake/Output from previous day: 12/07 0701 - 12/08 0700 In: 1368 [P.O.:360; I.V.:1008] Out: 2870 [Urine:2825; Drains:45] Intake/Output this shift: Total I/O In: 118.8 [I.V.:118.8] Out: 300 [Urine:300]  General appearance: alert, cooperative and no distress Resp: breathing comfortably GI: soft, slightly distended upper abdomen, dressing c/d/i.   Extremities: extremities normal, atraumatic, no cyanosis or edema drain serosang  Lab Results:   Recent Labs  02/26/16 0452 02/27/16 0308  WBC 15.0* 24.1*  HGB 10.1* 9.8*  HCT 31.0* 30.7*  PLT 235 259   BMET  Recent Labs  02/26/16 0452 02/27/16 0308  NA 135 134*  K 5.1 4.7  CL 103 98*  CO2 22 28  GLUCOSE 278* 188*  BUN 21* 10  CREATININE 1.29* 1.00  CALCIUM 8.4* 8.4*   PT/INR No results for input(s): LABPROT, INR in the last 72 hours. ABG No results for input(s): PHART, HCO3 in the last 72 hours.  Invalid input(s): PCO2, PO2  Studies/Results: No results found.  Anti-infectives: Anti-infectives    Start     Dose/Rate Route Frequency Ordered Stop   02/25/16 2200  ceFAZolin (ANCEF) IVPB 2g/100 mL premix     2 g 200 mL/hr over 30 Minutes Intravenous Every 8 hours 02/25/16 1955 02/25/16 2241   02/25/16 1245  metroNIDAZOLE (FLAGYL) IVPB 500 mg     500 mg 100 mL/hr over 60 Minutes Intravenous On call to O.R. 02/25/16 1119 02/25/16 1349   02/25/16 1119  ceFAZolin (ANCEF) IVPB 2g/100 mL premix     2 g 200 mL/hr over 30 Minutes Intravenous On call to O.R.  02/25/16 1119 02/25/16 1344      Assessment/Plan: s/p Procedure(s): LAPAROSCOPIC DISTAL PANCREATECTOMY (N/A) LAPAROSCOPIC SPLENECTOMY (N/A) D/c foley D/c PCA.  Pt barely using and monitoring equipment is inhibiting mobility.   Pulmonary toilet.   Already on Mobic, would not add toradol given baseline chronic kidney disease. Valium as muscle relaxant.   Doing standing tylenol, muscle relaxants, OnQ pain pump.   LOS: 2 days    [facility] 02/27/2016

## 2016-02-27 NOTE — Progress Notes (Signed)
Patient complaining of indigestion/heartburn and asking for medications. Patient has been taking Protonix. Dr. Grandville Silos paged and verbal orders received and placed.

## 2016-02-28 LAB — BASIC METABOLIC PANEL
ANION GAP: 7 (ref 5–15)
BUN: 7 mg/dL (ref 6–20)
CALCIUM: 8.3 mg/dL — AB (ref 8.9–10.3)
CHLORIDE: 99 mmol/L — AB (ref 101–111)
CO2: 29 mmol/L (ref 22–32)
Creatinine, Ser: 0.89 mg/dL (ref 0.44–1.00)
GFR calc non Af Amer: >60 mL/min (ref >60)
Glucose, Bld: 193 mg/dL — ABNORMAL HIGH (ref 65–99)
POTASSIUM: 3.9 mmol/L (ref 3.5–5.1)
Sodium: 135 mmol/L (ref 135–145)

## 2016-02-28 LAB — CBC
HEMATOCRIT: 30.8 % — AB (ref 36.0–46.0)
HEMOGLOBIN: 9.7 g/dL — AB (ref 12.0–15.0)
MCH: 27.6 pg (ref 26.0–34.0)
MCHC: 31.5 g/dL (ref 30.0–36.0)
MCV: 87.7 fL (ref 78.0–100.0)
Platelets: 273 10*3/uL (ref 150–400)
RBC: 3.51 MIL/uL — AB (ref 3.87–5.11)
RDW: 13.8 % (ref 11.5–15.5)
WBC: 25.1 10*3/uL — AB (ref 4.0–10.5)

## 2016-02-28 LAB — GLUCOSE, CAPILLARY
GLUCOSE-CAPILLARY: 160 mg/dL — AB (ref 65–99)
Glucose-Capillary: 187 mg/dL — ABNORMAL HIGH (ref 65–99)
Glucose-Capillary: 175 mg/dL — ABNORMAL HIGH (ref 65–99)
Glucose-Capillary: 196 mg/dL — ABNORMAL HIGH (ref 65–99)

## 2016-02-28 MED ORDER — BUPIVACAINE 0.25 % ON-Q PUMP DUAL CATH 300 ML
300.0000 mL | INJECTION | Status: DC
Start: 2016-02-28 — End: 2016-03-02
  Filled 2016-02-28: qty 300

## 2016-02-28 MED ORDER — BUPIVACAINE 0.25 % ON-Q PUMP SINGLE CATH 300ML
300.0000 mL | INJECTION | Status: DC
  Filled 2016-02-28: qty 300

## 2016-02-28 MED ORDER — PANTOPRAZOLE SODIUM 40 MG PO TBEC
40.0000 mg | DELAYED_RELEASE_TABLET | Freq: Every day | ORAL | Status: DC
  Administered 2016-02-29 – 2016-03-01 (×2): 40 mg via ORAL
  Filled 2016-02-28 (×2): qty 1

## 2016-02-28 MED ORDER — BUPIVACAINE ON-Q PAIN PUMP (FOR ORDER SET NO CHG)
INJECTION | Status: DC
  Filled 2016-02-28: qty 1

## 2016-02-28 NOTE — Progress Notes (Signed)
3 Days Post-Op  Subjective: She feels better, having some flatus, does not like clears liquids, has not really been OOB aside from bedside commode.    Objective: Vital signs in last 24 hours: Temp:  [98.1 F (36.7 C)-99.1 F (37.3 C)] 99.1 F (37.3 C) (12/09 0537) Pulse Rate:  [112-121] 113 (12/09 0537) Resp:  [18] 18 (12/09 0537) BP: (128-146)/(56-62) 139/62 (12/09 0537) SpO2:  [92 %-95 %] 92 % (12/09 0537) Last BM Date: 02/25/16 250 PO Urine 1900 1800 IV Drain 25 Afebrile, VSS WBC 25K, H/H stable  Intake/Output from previous day: 12/08 0701 - 12/09 0700 In: 2012.5 [P.O.:250; I.V.:1762.5] Out: 1925 [Urine:1900; Drains:25] Intake/Output this shift: No intake/output data recorded.  General appearance: alert, cooperative and no distress Resp: clear to auscultation bilaterally GI: soft sore, few BS and some flatus.  drain looks like old blood.  Lab Results:   Recent Labs  02/27/16 0308 02/28/16 0322  WBC 24.1* 25.1*  HGB 9.8* 9.7*  HCT 30.7* 30.8*  PLT 259 273    BMET  Recent Labs  02/27/16 0308 02/28/16 0322  NA 134* 135  K 4.7 3.9  CL 98* 99*  CO2 28 29  GLUCOSE 188* 193*  BUN 10 7  CREATININE 1.00 0.89  CALCIUM 8.4* 8.3*   PT/INR No results for input(s): LABPROT, INR in the last 72 hours.  No results for input(s): AST, ALT, ALKPHOS, BILITOT, PROT, ALBUMIN in the last 168 hours.   Lipase     Component Value Date/Time   LIPASE 33.0 12/15/2015 1021     Studies/Results: No results found. Prior to Admission medications   Medication Sig Start Date End Date Taking? Authorizing Provider  acetaminophen (TYLENOL) 500 MG tablet Take 1,000 mg by mouth every 6 (six) hours as needed for mild pain or headache.   Yes Historical Provider, MD  atorvastatin (LIPITOR) 20 MG tablet TAKE 1 TABLET (20 MG TOTAL) BY MOUTH AT BEDTIME. 12/08/15  Yes Midge Minium, MD  diazepam (VALIUM) 2 MG tablet Take 0.5 tablets (1 mg total) by mouth every 12 (twelve) hours as  needed for muscle spasms. Patient taking differently: Take 2 mg by mouth 2 (two) times daily.  02/10/16  Yes Brunetta Jeans, PA-C  glipiZIDE (GLUCOTROL XL) 5 MG 24 hr tablet Take 1 tablet (5 mg total) by mouth daily with breakfast. 12/17/15  Yes Midge Minium, MD  lisinopril (PRINIVIL,ZESTRIL) 10 MG tablet TAKE 1 TABLET (10 MG TOTAL) BY MOUTH DAILY. 12/15/15  Yes Midge Minium, MD  meloxicam (MOBIC) 7.5 MG tablet Take 1 tablet (7.5 mg total) by mouth daily. 02/10/16  Yes Brunetta Jeans, PA-C  ranitidine (ZANTAC) 150 MG tablet Take 150 mg by mouth daily as needed for heartburn.    Yes Historical Provider, MD  sitaGLIPtin (JANUVIA) 100 MG tablet Take 1 tablet (100 mg total) by mouth daily. Patient taking differently: Take 100 mg by mouth every evening.  08/15/15  Yes Midge Minium, MD  triamcinolone ointment (KENALOG) 0.1 % Apply 1 application topically 2 (two) times daily. Patient not taking: Reported on 02/16/2016 10/31/15 10/30/16  Midge Minium, MD    Medications: . docusate sodium  100 mg Oral BID  . enoxaparin (LOVENOX) injection  40 mg Subcutaneous Q24H  . insulin aspart  0-15 Units Subcutaneous TID WC  . lisinopril  10 mg Oral Daily  . meloxicam  7.5 mg Oral Daily  . pantoprazole (PROTONIX) IV  40 mg Intravenous QHS  . scopolamine  1  patch Transdermal Q72H   . bupivacaine ON-Q pain pump    . dextrose 5 % and 0.45% NaCl 75 mL/hr at 02/28/16 0629  . 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 Barry Dienes   Hx of Atrial fibrillation FEN:  IV fluids/Clears  =>>full liquids ID:  Pre op antibiotics only DVT: Lovenox     Plan:  Mobilize and advance diet, continue On-Q    LOS: 3 days    Ryka Beighley 02/28/2016 [phone]

## 2016-02-29 LAB — CBC
HEMATOCRIT: 27.9 % — AB (ref 36.0–46.0)
Hemoglobin: 8.8 g/dL — ABNORMAL LOW (ref 12.0–15.0)
MCH: 27.6 pg (ref 26.0–34.0)
MCHC: 31.5 g/dL (ref 30.0–36.0)
MCV: 87.5 fL (ref 78.0–100.0)
Platelets: 310 10*3/uL (ref 150–400)
RBC: 3.19 MIL/uL — ABNORMAL LOW (ref 3.87–5.11)
RDW: 13.9 % (ref 11.5–15.5)
WBC: 20.4 10*3/uL — ABNORMAL HIGH (ref 4.0–10.5)

## 2016-02-29 LAB — BASIC METABOLIC PANEL
Anion gap: 8 (ref 5–15)
BUN: 9 mg/dL (ref 6–20)
CALCIUM: 8.2 mg/dL — AB (ref 8.9–10.3)
CO2: 29 mmol/L (ref 22–32)
Chloride: 101 mmol/L (ref 101–111)
Creatinine, Ser: 0.89 mg/dL (ref 0.44–1.00)
GFR calc non Af Amer: >60 mL/min (ref >60)
Glucose, Bld: 170 mg/dL — ABNORMAL HIGH (ref 65–99)
Potassium: 3.8 mmol/L (ref 3.5–5.1)
SODIUM: 138 mmol/L (ref 135–145)

## 2016-02-29 LAB — GLUCOSE, CAPILLARY
GLUCOSE-CAPILLARY: 117 mg/dL — AB (ref 65–99)
GLUCOSE-CAPILLARY: 150 mg/dL — AB (ref 65–99)
Glucose-Capillary: 186 mg/dL — ABNORMAL HIGH (ref 65–99)
Glucose-Capillary: 131 mg/dL — ABNORMAL HIGH (ref 65–99)

## 2016-02-29 NOTE — Evaluation (Signed)
Physical Therapy Evaluation Patient Details Name: Tracy Williams MRN: [id number] DOB: Dec 18, 1945 Today's Date: 02/29/2016   History of Present Illness  Pt is a 70 y.o. female admitted with pancreatic mass now s/p distal pancreatectomy and splenectomy.  PMH: Rt knee DJD, HTN, diabetes.   Clinical Impression  Pt demonstrating decreased activity tolerance on instability during ambulation. Pt did have loss of balance X3 with need to use wall/rail for support. At home she is the primary caregiver for her husband but admits that she will not be able to assist him at this time. She is planning to have her daughter-in-law staying with her initially. Recommending HHPT due to instability and loss of balance during ambulation. This recommendation may be modified depending upon the patient's progress with mobility prior to D/C. PT to continue to follow.     Follow Up Recommendations Home health PT    Equipment Recommendations  Rolling walker with 5" wheels    Recommendations for Other Services       Precautions / Restrictions Precautions Precautions: Fall Precaution Comments: drain, on-Q pump Restrictions Weight Bearing Restrictions: No      Mobility  Bed Mobility               General bed mobility comments: pt in chair upon arrival  Transfers Overall transfer level: Needs assistance Equipment used: None Transfers: Sit to/from Stand Sit to Stand: Min guard         General transfer comment: guard for safety  Ambulation/Gait Ambulation/Gait assistance: Min guard Ambulation Distance (Feet): 120 Feet Assistive device: None Gait Pattern/deviations: Step-through pattern Gait velocity: decreased   General Gait Details: Pt with 3 losses of balance during ambulation, reaching out to wall for support. Discussed using rw when up with nursing.   Stairs            Wheelchair Mobility    Modified Rankin (Stroke Patients Only)       Balance Overall balance  assessment: Needs assistance Sitting-balance support: No upper extremity supported Sitting balance-Leahy Scale: Good     Standing balance support: No upper extremity supported Standing balance-Leahy Scale: Fair Standing balance comment: static standing                             Pertinent Vitals/Pain Pain Assessment: 0-10 Pain Score: 5  Pain Location: abdomen Pain Descriptors / Indicators: Sore;Operative site guarding Pain Intervention(s): Limited activity within patient's tolerance;Monitored during session    [place] expects to be discharged to:: Private residence Living Arrangements: Spouse/significant other Available Help at Discharge: Family Type of Home: House Home Access: Ramped entrance     Home Layout: One level   Additional Comments: Pt has equipment for husband not for her.     Prior Function Level of Independence: Independent         Comments: Pt is the primary caregiver for her husband. She states that her son has been staying with her husband and will have her daughter-in-law with her when she goes home. Encouraged pt to talk with family to ensure assistance once home.      Hand Dominance        Extremity/Trunk Assessment   Upper Extremity Assessment: Overall WFL for tasks assessed           Lower Extremity Assessment: Generalized weakness         Communication   Communication: No difficulties  Cognition Arousal/Alertness: Awake/alert Behavior During Therapy: WFL for tasks  assessed/performed Overall Cognitive Status: Within Functional Limits for tasks assessed                      General Comments      Exercises     Assessment/Plan    PT Assessment Patient needs continued PT services  PT Problem List Decreased strength;Decreased activity tolerance;Decreased balance;Decreased mobility;Pain          PT Treatment Interventions DME instruction;Gait training;Functional mobility  training;Therapeutic activities;Stair training;Therapeutic exercise;Patient/family education    PT Goals (Current goals can be found in the Care Plan section)  Acute Rehab PT Goals Patient Stated Goal: be able to get back home PT Goal Formulation: With patient Time For Goal Achievement: 03/14/16 Potential to Achieve Goals: Good    Frequency Min 3X/week   Barriers to discharge        Co-evaluation               End of Session Equipment Utilized During Treatment: Gait belt Activity Tolerance: Patient tolerated treatment well Patient left: in chair;with call bell/phone within reach Nurse Communication: Mobility status (encouraged ambulation with rw)    Functional Assessment Tool Used: clinical judgment Functional Limitation: Mobility: Walking and moving around Mobility: Walking and Moving Around Current Status [id number]): At least 40 percent but less than 60 percent impaired, limited or restricted Mobility: Walking and Moving Around Goal Status [phone]): At least 20 percent but less than 40 percent impaired, limited or restricted    Time: 1130-1154 PT Time Calculation (min) (ACUTE ONLY): 24 min   Charges:   PT Evaluation $PT Eval Moderate Complexity: 1 Procedure PT Treatments $Gait Training: 8-22 mins   PT G Codes:   PT G-Codes **NOT FOR INPATIENT CLASS** Functional Assessment Tool Used: clinical judgment Functional Limitation: Mobility: Walking and moving around Mobility: Walking and Moving Around Current Status [id number]): At least 40 percent but less than 60 percent impaired, limited or restricted Mobility: Walking and Moving Around Goal Status [phone]): At least 20 percent but less than 40 percent impaired, limited or restricted    Cassell Clement, PT, [place] Pager [phone] Office [phone]  02/29/2016, 1:05 PM

## 2016-02-29 NOTE — Progress Notes (Signed)
  Progress Note: General Surgery Service   Subjective: Pain controlled, ambulating, tolerating fulls, not complete appetite yet, no nausea  Objective: Vital signs in last 24 hours: Temp:  [97.8 F (36.6 C)-98.9 F (37.2 C)] 97.8 F (36.6 C) (12/10 0431) Pulse Rate:  [83-109] 101 (12/10 0431) Resp:  [17-18] 18 (12/10 0431) BP: (120-147)/(56-63) 140/63 (12/10 0431) SpO2:  [92 %-93 %] 93 % (12/10 0431) Last BM Date: 02/25/16  Intake/Output from previous day: 12/09 0701 - 12/10 0700 In: 2497.5 [P.O.:800; I.V.:1697.5] Out: 951 [Urine:951] Intake/Output this shift: No intake/output data recorded.  Lungs: CTAB  Cardiovascular: RRR  Abd: soft, ATTP, incision bandaged, no surrounding erythema  Extremities: no edema  Neuro: AOx4  Lab Results: CBC   Recent Labs  02/28/16 0322 02/29/16 0403  WBC 25.1* 20.4*  HGB 9.7* 8.8*  HCT 30.8* 27.9*  PLT 273 310   BMET  Recent Labs  02/28/16 0322 02/29/16 0403  NA 135 138  K 3.9 3.8  CL 99* 101  CO2 29 29  GLUCOSE 193* 170*  BUN 7 9  CREATININE 0.89 0.89  CALCIUM 8.3* 8.2*   PT/INR No results for input(s): LABPROT, INR in the last 72 hours. ABG No results for input(s): PHART, HCO3 in the last 72 hours.  Invalid input(s): PCO2, PO2  Studies/Results:  Anti-infectives: Anti-infectives    Start     Dose/Rate Route Frequency Ordered Stop   02/25/16 2200  ceFAZolin (ANCEF) IVPB 2g/100 mL premix     2 g 200 mL/hr over 30 Minutes Intravenous Every 8 hours 02/25/16 1955 02/25/16 2241   02/25/16 1245  metroNIDAZOLE (FLAGYL) IVPB 500 mg     500 mg 100 mL/hr over 60 Minutes Intravenous On call to O.R. 02/25/16 1119 02/25/16 1349   02/25/16 1119  ceFAZolin (ANCEF) IVPB 2g/100 mL premix     2 g 200 mL/hr over 30 Minutes Intravenous On call to O.R. 02/25/16 1119 02/25/16 1344      Medications: Scheduled Meds: . docusate sodium  100 mg Oral BID  . enoxaparin (LOVENOX) injection  40 mg Subcutaneous Q24H  . insulin  aspart  0-15 Units Subcutaneous TID WC  . lisinopril  10 mg Oral Daily  . meloxicam  7.5 mg Oral Daily  . pantoprazole  40 mg Oral Q supper  . scopolamine  1 patch Transdermal Q72H   Continuous Infusions: . bupivacaine 0.25 % ON-Q pump DUAL CATH 300 mL    . lactated ringers 50 mL/hr at 02/25/16 1135   PRN Meds:.acetaminophen, alum & mag hydroxide-simeth, diazepam, diphenhydrAMINE **OR** diphenhydrAMINE, hydrALAZINE, morphine injection, ondansetron **OR** ondansetron (ZOFRAN) IV, polyethylene glycol, promethazine, simethicone, zolpidem  Assessment/Plan: Patient Active Problem List   Diagnosis Date Noted  . 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 Shingles 11/14/2015  . Pancreatic mass 11/09/2015  . Leukocytosis 11/09/2015  . Emesis   . Pain of upper abdomen   . Bilateral low back pain without sciatica 10/03/2014  . 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 Diabetes mellitus type 2, controlled, without complications ([place]) 0000000  . Bronchitis 03/19/2011  . General medical examination 11/26/2010  . HTN (hypertension) 11/26/2010  . Depression 11/26/2010  . Colon cancer screening 11/26/2010  . Hyperlipidemia 11/26/2010   s/p Procedure(s): LAPAROSCOPIC DISTAL PANCREATECTOMY LAPAROSCOPIC SPLENECTOMY 02/25/2016 -soft diet -dc fluids -ambulate in halls with assist -continue Onq    LOS: 4 days   Mickeal Skinner, MD Pg# [phone] [facility]

## 2016-03-01 LAB — GLUCOSE, CAPILLARY
GLUCOSE-CAPILLARY: 72 mg/dL (ref 65–99)
GLUCOSE-CAPILLARY: 213 mg/dL — AB (ref 65–99)
Glucose-Capillary: 155 mg/dL — ABNORMAL HIGH (ref 65–99)
Glucose-Capillary: 258 mg/dL — ABNORMAL HIGH (ref 65–99)

## 2016-03-01 LAB — BASIC METABOLIC PANEL
ANION GAP: 9 (ref 5–15)
BUN: 10 mg/dL (ref 6–20)
CALCIUM: 8.2 mg/dL — AB (ref 8.9–10.3)
CO2: 28 mmol/L (ref 22–32)
Chloride: 99 mmol/L — ABNORMAL LOW (ref 101–111)
Creatinine, Ser: 0.88 mg/dL (ref 0.44–1.00)
GFR calc non Af Amer: >60 mL/min (ref >60)
GLUCOSE: 163 mg/dL — AB (ref 65–99)
POTASSIUM: 3.8 mmol/L (ref 3.5–5.1)
Sodium: 136 mmol/L (ref 135–145)

## 2016-03-01 LAB — TYPE AND SCREEN
ABO/RH(D): A NEG
Antibody Screen: NEGATIVE
UNIT DIVISION: 0
UNIT DIVISION: 0

## 2016-03-01 LAB — CBC
HEMATOCRIT: 29 % — AB (ref 36.0–46.0)
HEMOGLOBIN: 9 g/dL — AB (ref 12.0–15.0)
MCH: 26.8 pg (ref 26.0–34.0)
MCHC: 31 g/dL (ref 30.0–36.0)
MCV: 86.3 fL (ref 78.0–100.0)
Platelets: 429 10*3/uL — ABNORMAL HIGH (ref 150–400)
RBC: 3.36 MIL/uL — AB (ref 3.87–5.11)
RDW: 13.8 % (ref 11.5–15.5)
WBC: 18.1 10*3/uL — AB (ref 4.0–10.5)

## 2016-03-01 NOTE — Progress Notes (Signed)
Patient reported to this RN that the tubes of her Q-ball pain pump accidentally got detached when getting out of bed.  Qball tubes are intact and RLQ tube site is dry and intact.  On call Gen. Surgery MD made aware and no further order.

## 2016-03-01 NOTE — Progress Notes (Signed)
Progress Note: General Surgery Service   Subjective: Doing a bit better.  Passing significant flatus.  No BM yet.  No n/v.  Tolerating soft diet, but decreased appetite, as expected.  Pt very upset this AM because her husband may have to get admitted to the hospital, and they are going to have to put her great dane to sleep.    Objective: Vital signs in last 24 hours: Temp:  [98.4 F (36.9 C)-99.1 F (37.3 C)] 98.8 F (37.1 C) (12/11 0623) Pulse Rate:  [96-103] 96 (12/11 0623) Resp:  [17-18] 17 (12/11 0623) BP: (78-147)/(49-59) 136/59 (12/11 0623) SpO2:  [91 %-94 %] 94 % (12/11 0623) Last BM Date: 02/25/16  Intake/Output from previous day: 12/10 0701 - 12/11 0700 In: 720 [P.O.:720] Out: 1 [Drains:1] Intake/Output this shift: No intake/output data recorded.  Ben:  Alert and oriented, no acute distress.    Lungs: breathing comfortably.    Cardiovascular: RRR  Abd: soft, ATTP, incision bandaged, no surrounding erythema.  Drain with serosang, dark  Extremities: no edema    Lab Results: CBC   Recent Labs  02/29/16 0403 03/01/16 0544  WBC 20.4* 18.1*  HGB 8.8* 9.0*  HCT 27.9* 29.0*  PLT 310 429*   BMET  Recent Labs  02/29/16 0403 03/01/16 0544  NA 138 136  K 3.8 3.8  CL 101 99*  CO2 29 28  GLUCOSE 170* 163*  BUN 9 10  CREATININE 0.89 0.88  CALCIUM 8.2* 8.2*   PT/INR No results for input(s): LABPROT, INR in the last 72 hours. ABG No results for input(s): PHART, HCO3 in the last 72 hours.  Invalid input(s): PCO2, PO2  Studies/Results:  Anti-infectives: Anti-infectives    Start     Dose/Rate Route Frequency Ordered Stop   02/25/16 2200  ceFAZolin (ANCEF) IVPB 2g/100 mL premix     2 g 200 mL/hr over 30 Minutes Intravenous Every 8 hours 02/25/16 1955 02/25/16 2241   02/25/16 1245  metroNIDAZOLE (FLAGYL) IVPB 500 mg     500 mg 100 mL/hr over 60 Minutes Intravenous On call to O.R. 02/25/16 1119 02/25/16 1349   02/25/16 1119  ceFAZolin (ANCEF) IVPB  2g/100 mL premix     2 g 200 mL/hr over 30 Minutes Intravenous On call to O.R. 02/25/16 1119 02/25/16 1344      Medications: Scheduled Meds: . docusate sodium  100 mg Oral BID  . enoxaparin (LOVENOX) injection  40 mg Subcutaneous Q24H  . insulin aspart  0-15 Units Subcutaneous TID WC  . lisinopril  10 mg Oral Daily  . meloxicam  7.5 mg Oral Daily  . pantoprazole  40 mg Oral Q supper  . scopolamine  1 patch Transdermal Q72H   Continuous Infusions: . bupivacaine 0.25 % ON-Q pump DUAL CATH 300 mL    . lactated ringers 50 mL/hr at 02/25/16 1135   PRN Meds:.acetaminophen, alum & mag hydroxide-simeth, diazepam, diphenhydrAMINE **OR** diphenhydrAMINE, hydrALAZINE, morphine injection, ondansetron **OR** ondansetron (ZOFRAN) IV, polyethylene glycol, promethazine, simethicone, zolpidem  Assessment/Plan: Patient Active Problem List   Diagnosis Date Noted  . 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 Shingles 11/14/2015  . Pancreatic mass 11/09/2015  . Leukocytosis 11/09/2015  . Emesis   . Pain of upper abdomen   . Bilateral low back pain without sciatica 10/03/2014  . 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 Diabetes mellitus type 2, controlled, without complications ([place]) 0000000  . Bronchitis 03/19/2011  . General medical examination 11/26/2010  . HTN (hypertension)  11/26/2010  . Depression 11/26/2010  . Colon cancer screening 11/26/2010  . Hyperlipidemia 11/26/2010   s/p Procedure(s): LAPAROSCOPIC DISTAL PANCREATECTOMY LAPAROSCOPIC SPLENECTOMY 02/25/2016 -soft diet to continue.  - -ambulate in halls with assist -continue [facility] for d/c tomorrow.   D/C drain.     LOS: 5 days   Stark Klein, [facility]

## 2016-03-01 NOTE — Care Management Important Message (Signed)
Important Message  Patient Details  Name: Tracy Williams MRN: [id number] Date of Birth: 10/25/1945   Medicare Important Message Given:  Yes    Orbie Pyo 03/01/2016, 12:01 PM

## 2016-03-01 NOTE — Progress Notes (Signed)
Physical Therapy Treatment Patient Details Name: Tracy Williams MRN: [id number] DOB: 1946-01-14 Today's Date: 03/01/2016    History of Present Illness Pt is a 70 y.o. female admitted with pancreatic mass now s/p distal pancreatectomy and splenectomy.  PMH: Rt knee DJD, HTN, diabetes.     PT Comments    Pt with improved ambulation distance and stability when using rw. Pt reports that the plan remains to go home and have her daughter-in-law stay with her. PT to continue to follow and progress mobility in anticipation of D/C to home.   Follow Up Recommendations  No PT follow up     Equipment Recommendations   (pt declined rw for home, reports having several)    Recommendations for Other Services       Precautions / Restrictions Precautions Precautions: Fall Precaution Comments: drain, on-Q pump Restrictions Weight Bearing Restrictions: No    Mobility  Bed Mobility Overal bed mobility: Needs Assistance Bed Mobility: Supine to Sit;Sit to Supine     Supine to sit: Supervision     General bed mobility comments: supervision for safety  Transfers Overall transfer level: Needs assistance Equipment used: Rolling walker (2 wheeled) Transfers: Sit to/from Stand Sit to Stand: Supervision         General transfer comment: supervision for safety  Ambulation/Gait Ambulation/Gait assistance: Supervision Ambulation Distance (Feet): 250 Feet Assistive device: Rolling walker (2 wheeled) Gait Pattern/deviations: Step-through pattern Gait velocity: decreased   General Gait Details: no loss of balance, good posture   Stairs            Wheelchair Mobility    Modified Rankin (Stroke Patients Only)       Balance Overall balance assessment: Needs assistance Sitting-balance support: No upper extremity supported Sitting balance-Leahy Scale: Good     Standing balance support: No upper extremity supported Standing balance-Leahy Scale: Fair Standing balance  comment: static standing                    Cognition Arousal/Alertness: Awake/alert Behavior During Therapy: WFL for tasks assessed/performed Overall Cognitive Status: Within Functional Limits for tasks assessed                      Exercises      General Comments        Pertinent Vitals/Pain Pain Assessment: 0-10 Pain Score: 6  Pain Location: abdomen Pain Descriptors / Indicators: Sore;Operative site guarding Pain Intervention(s): Limited activity within patient's tolerance    Home Living                      Prior Function            PT Goals (current goals can now be found in the care plan section) Acute Rehab PT Goals Patient Stated Goal: be able to get back home PT Goal Formulation: With patient Time For Goal Achievement: 03/14/16 Potential to Achieve Goals: Good Progress towards PT goals: Progressing toward goals    Frequency    Min 3X/week      PT Plan Equipment recommendations need to be updated    Co-evaluation             End of Session Equipment Utilized During Treatment:  (pt declined use of gait belt) Activity Tolerance: Patient tolerated treatment well Patient left: in bed;with call bell/phone within reach     Time: 1203-1225 PT Time Calculation (min) (ACUTE ONLY): 22 min  Charges:  $Gait Training: 8-22 mins  G Codes:      Cassell Clement, PT, CSCS Pager [phone] Office [phone]  03/01/2016, 1:04 PM

## 2016-03-01 NOTE — Care Management Note (Signed)
Case Management Note  Patient Details  Name: Tracy Williams MRN: [id number] Date of Birth: 05/23/1945  Subjective/Objective:                    Action/Plan:  PT recommending HHPT and walker. Discussed same with patient. At this time patient does not feel that she needs home health . Her husband has had multiple back surgeries and has home health many times . She also has a walker at home.    Expected Discharge Date:                  Expected Discharge Plan:  Home/Self Care  In-House Referral:     Discharge planning Services     Post Acute Care Choice:    Choice offered to:  Patient  DME Arranged:    DME Agency:     HH Arranged:  Patient Refused [place] Agency:     Status of Service:  Completed, signed off  If discussed at [employer] of Stay Meetings, dates discussed:    Additional Comments:  Marilu Favre, RN 03/01/2016, 10:36 AM

## 2016-03-02 LAB — GLUCOSE, CAPILLARY
GLUCOSE-CAPILLARY: 188 mg/dL — AB (ref 65–99)
GLUCOSE-CAPILLARY: 191 mg/dL — AB (ref 65–99)

## 2016-03-02 MED ORDER — GLIPIZIDE ER 5 MG PO TB24
5.0000 mg | ORAL_TABLET | Freq: Every day | ORAL | Status: DC
  Filled 2016-03-02: qty 1

## 2016-03-02 MED ORDER — DIAZEPAM 2 MG PO TABS: 2.0000 mg | ORAL_TABLET | Freq: Four times a day (QID) | ORAL | 1 refills | Status: DC | PRN

## 2016-03-02 MED ORDER — LINAGLIPTIN 5 MG PO TABS
5.0000 mg | ORAL_TABLET | Freq: Every day | ORAL | Status: DC
  Administered 2016-03-02: 5 mg via ORAL
  Filled 2016-03-02: qty 1

## 2016-03-02 NOTE — Discharge Summary (Signed)
Physician Discharge Summary  Patient ID: Tracy Williams MRN: [id number] DOB/AGE: [birth year] 70 y.o.  Admit date: 02/25/2016 Discharge date: 03/03/2016  Admission Diagnoses: Patient Active Problem List   Diagnosis Date Noted  . 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 Shingles 11/14/2015  . Pancreatic mass 11/09/2015  . Leukocytosis 11/09/2015  . Emesis   . Pain of upper abdomen   . Bilateral low back pain without sciatica 10/03/2014  . 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 Diabetes mellitus type 2, controlled, without complications ([place]) 0000000  . Bronchitis 03/19/2011  . General medical examination 11/26/2010  . HTN (hypertension) 11/26/2010  . Depression 11/26/2010  . Colon cancer screening 11/26/2010  . Hyperlipidemia 11/26/2010    Discharge Diagnoses:  Active Problems:   Cystic mass of pancreas Same and DM  Discharged Condition: stable  Hospital Course:  Pt was admitted to the hospital following a hand assisted lap distal pancreatectomy/splenectomy for a complex cystic pancreatic mass with LUQ pain.  She did well post op, but did have some pain issues secondary to her intolerance of pretty much all narcotics.  We tried a morphine PCA, but even low infrequent doses gave her significant nausea and dizziness.  She switched to around the clock tylenol and low dose valium for muscle relaxation which worked better.  She was ambulatory and able to void independently after foley removal.  She had an anticipated ileus for 2-3 days, and then started passing gas.  Her diet was advanced.  She was able to tolerate small meals and had BM prior to d/c.  Her drain was removed prior to d/c as it was serosanguinous and drainage came down to minimal per day.  I discussed symptoms to call for or return to the ED such as fevers/shortness of breath/severe pain, nausea/vomiting, decreased urine output or night sweats.  I discussed that she is still not far  enough out post op to know that she will not have a pancreatic leak.  Pathology came back while she was an inpatient and showed a mucinous cystadenoma with dysplasia which was consistent with pre operative picture.    Her blood sugars were initially higher, but she did not require significant amounts of insulin.  She was placed back on her oral antihyperglycemics.  She was instructed to follow up with her PCP regarding this.    Consults: None  Significant Diagnostic Studies: labs: WBCs down to 18 prior to d/c.  Had anticipated post splenectomy bump  Treatments: surgery: see above  Discharge Exam: Blood pressure (!) 120/54, pulse 88, temperature 97.7 F (36.5 C), temperature source Oral, resp. rate 18, height 5\' 5"  (1.651 m), weight 76.2 kg (168 lb), SpO2 92 %. General appearance: alert, cooperative and no distress Resp: breathing comfortably GI: soft, non distended, approp tender Extremities: extremities normal, atraumatic, no cyanosis or edema  Disposition: 01-Home or Self Care  Discharge Instructions    Call MD for:  persistant nausea and vomiting    Complete by:  As directed    Call MD for:  redness, tenderness, or signs of infection (pain, swelling, redness, odor or green/yellow discharge around incision site)    Complete by:  As directed    Call MD for:  severe uncontrolled pain    Complete by:  As directed    Call MD for:  temperature >100.4    Complete by:  As directed    Discharge diet:    Complete by:  As directed    Carb  mod   Increase activity slowly    Complete by:  As directed        Medication List    TAKE these medications   acetaminophen 500 MG tablet Commonly known as:  TYLENOL Take 1,000 mg by mouth every 6 (six) hours as needed for mild pain or headache.   atorvastatin 20 MG tablet Commonly known as:  LIPITOR TAKE 1 TABLET (20 MG TOTAL) BY MOUTH AT BEDTIME.   diazepam 2 MG tablet Commonly known as:  VALIUM Take 0.5 tablets (1 mg total) by mouth  every 12 (twelve) hours as needed for muscle spasms. What changed:  how much to take  when to take this   diazepam 2 MG tablet Commonly known as:  VALIUM Take 1 tablet (2 mg total) by mouth every 6 (six) hours as needed for anxiety or muscle spasms. What changed:  You were already taking a medication with the same name, and this prescription was added. Make sure you understand how and when to take each.   glipiZIDE 5 MG 24 hr tablet Commonly known as:  GLUCOTROL XL Take 1 tablet (5 mg total) by mouth daily with breakfast.   lisinopril 10 MG tablet Commonly known as:  PRINIVIL,ZESTRIL TAKE 1 TABLET (10 MG TOTAL) BY MOUTH DAILY.   meloxicam 7.5 MG tablet Commonly known as:  MOBIC Take 1 tablet (7.5 mg total) by mouth daily.   ranitidine 150 MG tablet Commonly known as:  ZANTAC Take 150 mg by mouth daily as needed for heartburn.   sitaGLIPtin 100 MG tablet Commonly known as:  JANUVIA Take 1 tablet (100 mg total) by mouth daily. What changed:  when to take this   triamcinolone ointment 0.1 % Commonly known as:  KENALOG Apply 1 application topically 2 (two) times daily.      Follow-up Information    Tashiba Timoney, MD Follow up in 2 week(s).   Specialty:  General Surgery Contact information: [address] [place] [address] [phone]           Signed: Stark Klein 03/03/2016, 10:14 PM

## 2016-03-02 NOTE — Consult Note (Addendum)
   [facility] CM Inpatient Consult   03/02/2016  Tracy Williams 05-Jul-1945 [id number]    Spoke with [name] via phone to offer and discuss [facility] services. She is familiar with [facility] as her husband is active with [facility] program. Her husband is currently inpatient at [facility]. [name] pleasantly declines [facility] follow up . She states " I think I am fine right now". She states her son and daughter in law will be with her post discharge. [name] knows how to contact [facility] in the future should she be interested in follow up. Appreciative of the call. Made inpatient RNCM aware that [name] declined [facility].    Marthenia Rolling, MSN-Ed, RN,BSN [facility] Liaison [phone]

## 2016-03-02 NOTE — Discharge Instructions (Signed)
[facility]      [facility] °[phone] ° °ABDOMINAL SURGERY: POST OP INSTRUCTIONS ° °Always review your discharge instruction sheet given to you by the facility where your surgery was performed. ° °IF YOU HAVE DISABILITY OR FAMILY LEAVE FORMS, YOU MUST BRING THEM TO THE OFFICE FOR PROCESSING.  PLEASE DO NOT GIVE THEM TO YOUR DOCTOR. ° °1. A prescription for pain medication may be given to you upon discharge.  Take your pain medication as prescribed, if needed.  If narcotic pain medicine is not needed, then you may take acetaminophen (Tylenol) or ibuprofen (Advil) as needed. °2. Take your usually prescribed medications unless otherwise directed. °3. If you need a refill on your pain medication, please contact your pharmacy. They will contact our office to request authorization.  Prescriptions will not be filled after 5pm or on week-ends. °4. You should follow a light diet the first few days after arrival home, such as soup and crackers, pudding, etc.unless your doctor has advised otherwise. A high-fiber, low fat diet can be resumed as tolerated.   Be sure to include lots of fluids daily. Most patients will experience some swelling and bruising on the chest and neck area.  Ice packs will help.  Swelling and bruising can take several days to resolve °5. Most patients will experience some swelling and bruising in the area of the incision. Ice pack will help. Swelling and bruising can take several days to resolve..  °6. It is common to experience some constipation if taking pain medication after surgery.  Increasing fluid intake and taking a stool softener will usually help or prevent this problem from occurring.  A mild laxative (Milk of Magnesia or Miralax) should be taken according to package directions if there are no bowel movements after 48 hours. °7.  You may have steri-strips (small skin tapes) in place directly over the incision.  These strips should be left on the skin for 10-14 days.  If your  surgeon used skin glue on the incision, you may shower in 48 hours.  The glue will flake off over the next 2-3 weeks.  Any sutures or staples will be removed at the office during your follow-up visit. You may find that a light gauze bandage over your incision may keep your staples from being rubbed or pulled. You may shower and replace the bandage daily. °8. ACTIVITIES:  You may resume regular (light) daily activities beginning the next day--such as daily self-care, walking, climbing stairs--gradually increasing activities as tolerated.  You may have sexual intercourse when it is comfortable.  Refrain from any heavy lifting or straining until approved by your doctor. °a. You may drive when you no longer are taking prescription pain medication, you can comfortably wear a seatbelt, and you can safely maneuver your car and apply brakes °b. Return to Work: __________8 weeks if applicable_________________________ °9. You should see your doctor in the office for a follow-up appointment approximately two weeks after your surgery.  Make sure that you call for this appointment within a day or two after you arrive home to insure a convenient appointment time. °OTHER INSTRUCTIONS:  °_____________________________________________________________ °_____________________________________________________________ ° °WHEN TO CALL YOUR DOCTOR: °1. Fever over 101.0 °2. Inability to urinate °3. Nausea and/or vomiting °4. Extreme swelling or bruising °5. Continued bleeding from incision. °6. Increased pain, redness, or drainage from the incision. °7. Difficulty swallowing or breathing °8. Muscle cramping or spasms. °9. Numbness or tingling in hands or feet or around lips. ° °The clinic staff is   available to answer your questions during regular business hours.  Please don’t hesitate to call and ask to speak to one of the nurses if you have concerns. ° °For further questions, please visit [email or web address] ° ° ° °

## 2016-04-19 ENCOUNTER — Ambulatory Visit (INDEPENDENT_AMBULATORY_CARE_PROVIDER_SITE_OTHER): Payer: Medicare Other | Admitting: Family Medicine

## 2016-04-19 ENCOUNTER — Encounter: Payer: Self-pay | Admitting: Family Medicine

## 2016-04-19 VITALS — BP 126/70 | HR 89 | Temp 98.1°F | Resp 16 | Ht 65.0 in | Wt 163.2 lb

## 2016-04-19 DIAGNOSIS — E119 Type 2 diabetes mellitus without complications: Secondary | ICD-10-CM

## 2016-04-19 LAB — BASIC METABOLIC PANEL
BUN: 17 mg/dL (ref 6–23)
CHLORIDE: 104 meq/L (ref 96–112)
CO2: 29 mEq/L (ref 19–32)
CREATININE: 0.88 mg/dL (ref 0.40–1.20)
Calcium: 9.4 mg/dL (ref 8.4–10.5)
GFR: 67.45 mL/min (ref >60.00)
GLUCOSE: 143 mg/dL — AB (ref 70–99)
Potassium: 4.7 mEq/L (ref 3.5–5.1)
Sodium: 140 mEq/L (ref 135–145)

## 2016-04-19 MED ORDER — "INSULIN SYRINGE-NEEDLE U-100 31G X 5/16"" 0.5 ML MISC": 3 refills | Status: DC

## 2016-04-19 MED ORDER — GLUCOSE BLOOD VI STRP: ORAL_STRIP | 12 refills | Status: DC

## 2016-04-19 NOTE — Progress Notes (Signed)
   Subjective:    Patient ID: Tracy Williams, female    DOB: 05-01-1945, 71 y.o.   MRN: [id number]  HPI DM- chronic problem.  On Januvia, Glucotrol.  On ACE for renal protection.  UTD on eye exam, due for foot exam.  Denies CP, SOB, HAs, visual changes, edema.  No numbness/tingling of hands/feet.  Pt reports CBGs are running 'about 130'.  Denies symptomatic lows.  Has completely changed diet since surgery.     Review of Systems For ROS see HPI     Objective:   Physical Exam  Constitutional: She is oriented to person, place, and time. She appears well-developed and well-nourished. No distress.  HENT:  Head: Normocephalic and atraumatic.  Eyes: Conjunctivae and EOM are normal. Pupils are equal, round, and reactive to light.  Neck: Normal range of motion. Neck supple. No thyromegaly present.  Cardiovascular: Normal rate, regular rhythm, normal heart sounds and intact distal pulses.   No murmur heard. Pulmonary/Chest: Effort normal and breath sounds normal. No respiratory distress.  Abdominal: Soft. She exhibits no distension. There is no tenderness.  Musculoskeletal: She exhibits no edema.  Lymphadenopathy:    She has no cervical adenopathy.  Neurological: She is alert and oriented to person, place, and time.  Skin: Skin is warm and dry.  Psychiatric: She has a normal mood and affect. Her behavior is normal.  Vitals reviewed.         Assessment & Plan:

## 2016-04-19 NOTE — Assessment & Plan Note (Signed)
Chronic problem.  Pt's recent A1C was excellent at 6.8.  Home CBGs are good and she denies symptomatic lows.  UTD on eye exam.  Foot exam done today.  Asymptomatic.  Check BMP.  No anticipated med changes.

## 2016-04-19 NOTE — Progress Notes (Signed)
Pre visit review using our clinic review tool, if applicable. No additional management support is needed unless otherwise documented below in the visit note. 

## 2016-04-19 NOTE — Patient Instructions (Signed)
Schedule your complete physical in 3-4 months We'll notify you of your lab results and make any changes if needed Keep up the good work!  You look great! Call with any questions or concerns Happy New Year!!!

## 2016-04-21 ENCOUNTER — Encounter: Payer: Self-pay | Admitting: Family Medicine

## 2016-04-21 MED ORDER — FREESTYLE LANCETS MISC: 12 refills | Status: DC

## 2016-05-24 ENCOUNTER — Other Ambulatory Visit: Payer: Self-pay | Admitting: General Practice

## 2016-05-24 MED ORDER — GLIPIZIDE ER 5 MG PO TB24: 5.0000 mg | ORAL_TABLET | Freq: Every day | ORAL | 1 refills | Status: DC

## 2016-05-27 ENCOUNTER — Other Ambulatory Visit: Payer: Self-pay | Admitting: Family Medicine

## 2016-05-28 ENCOUNTER — Other Ambulatory Visit: Payer: Self-pay | Admitting: Family Medicine

## 2016-06-21 ENCOUNTER — Other Ambulatory Visit: Payer: Self-pay | Admitting: Family Medicine

## 2016-07-10 ENCOUNTER — Other Ambulatory Visit: Payer: Self-pay | Admitting: Family Medicine

## 2016-07-15 ENCOUNTER — Other Ambulatory Visit: Payer: Self-pay | Admitting: Family Medicine

## 2016-07-19 ENCOUNTER — Other Ambulatory Visit: Payer: Self-pay | Admitting: Family Medicine

## 2016-07-20 ENCOUNTER — Other Ambulatory Visit: Payer: Self-pay | Admitting: Family Medicine

## 2016-07-20 ENCOUNTER — Telehealth: Payer: Self-pay | Admitting: Family Medicine

## 2016-07-20 DIAGNOSIS — Z1231 Encounter for screening mammogram for malignant neoplasm of breast: Secondary | ICD-10-CM

## 2016-07-20 DIAGNOSIS — M810 Age-related osteoporosis without current pathological fracture: Secondary | ICD-10-CM

## 2016-07-20 DIAGNOSIS — E2839 Other primary ovarian failure: Secondary | ICD-10-CM

## 2016-07-20 NOTE — Telephone Encounter (Signed)
Pt asking for an order to be place so she can schedule her bone density test with [facility].

## 2016-07-21 ENCOUNTER — Other Ambulatory Visit: Payer: Self-pay | Admitting: General Practice

## 2016-07-21 NOTE — Telephone Encounter (Signed)
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

## 2016-07-21 NOTE — Telephone Encounter (Signed)
New order was placed.

## 2016-07-21 NOTE — Telephone Encounter (Signed)
Placed order for osteoporosis, post menopausal will this work for a diagnosis or does it have to be estrogen deficiency?

## 2016-07-21 NOTE — Telephone Encounter (Signed)
Please advise on dx.  

## 2016-08-12 ENCOUNTER — Ambulatory Visit (INDEPENDENT_AMBULATORY_CARE_PROVIDER_SITE_OTHER): Payer: Medicare Other | Admitting: Family Medicine

## 2016-08-12 ENCOUNTER — Encounter: Payer: Self-pay | Admitting: Family Medicine

## 2016-08-12 VITALS — BP 120/64 | HR 83 | Temp 98.4°F | Resp 16 | Ht 65.0 in | Wt 165.0 lb

## 2016-08-12 DIAGNOSIS — Z0000 Encounter for general adult medical examination without abnormal findings: Secondary | ICD-10-CM | POA: Diagnosis not present

## 2016-08-12 DIAGNOSIS — E119 Type 2 diabetes mellitus without complications: Secondary | ICD-10-CM | POA: Diagnosis not present

## 2016-08-12 DIAGNOSIS — Z1231 Encounter for screening mammogram for malignant neoplasm of breast: Secondary | ICD-10-CM | POA: Diagnosis not present

## 2016-08-12 DIAGNOSIS — I10 Essential (primary) hypertension: Secondary | ICD-10-CM | POA: Diagnosis not present

## 2016-08-12 DIAGNOSIS — M810 Age-related osteoporosis without current pathological fracture: Secondary | ICD-10-CM

## 2016-08-12 DIAGNOSIS — E785 Hyperlipidemia, unspecified: Secondary | ICD-10-CM

## 2016-08-12 LAB — HEPATIC FUNCTION PANEL
ALBUMIN: 4.2 g/dL (ref 3.5–5.2)
ALT: 12 U/L (ref 0–35)
AST: 13 U/L (ref 0–37)
Alkaline Phosphatase: 99 U/L (ref 39–117)
Bilirubin, Direct: 0.1 mg/dL (ref 0.0–0.3)
TOTAL PROTEIN: 6.7 g/dL (ref 6.0–8.3)
Total Bilirubin: 0.5 mg/dL (ref 0.2–1.2)

## 2016-08-12 LAB — CBC WITH DIFFERENTIAL/PLATELET
Basophils Absolute: 0 10*3/uL (ref 0.0–0.1)
Basophils Relative: 0.4 % (ref 0.0–3.0)
EOS ABS: 0.1 10*3/uL (ref 0.0–0.7)
EOS PCT: 0.5 % (ref 0.0–5.0)
HCT: 39.2 % (ref 36.0–46.0)
Hemoglobin: 12.9 g/dL (ref 12.0–15.0)
LYMPHS ABS: 1.1 10*3/uL (ref 0.7–4.0)
Lymphocytes Relative: 8.8 % — ABNORMAL LOW (ref 12.0–46.0)
MCHC: 33 g/dL (ref 30.0–36.0)
MCV: 85.9 fl (ref 78.0–100.0)
MONO ABS: 1 10*3/uL (ref 0.1–1.0)
Monocytes Relative: 8 % (ref 3.0–12.0)
NEUTROS PCT: 82.3 % — AB (ref 43.0–77.0)
Neutro Abs: 10.4 10*3/uL — ABNORMAL HIGH (ref 1.4–7.7)
Platelets: 354 10*3/uL (ref 150.0–400.0)
RBC: 4.57 Mil/uL (ref 3.87–5.11)
RDW: 14.7 % (ref 11.5–15.5)
WBC: 12.6 10*3/uL — ABNORMAL HIGH (ref 4.0–10.5)

## 2016-08-12 LAB — BASIC METABOLIC PANEL
BUN: 18 mg/dL (ref 6–23)
CALCIUM: 9.6 mg/dL (ref 8.4–10.5)
CHLORIDE: 99 meq/L (ref 96–112)
CO2: 30 meq/L (ref 19–32)
CREATININE: 1.08 mg/dL (ref 0.40–1.20)
GFR: 53.2 mL/min — ABNORMAL LOW (ref >60.00)
GLUCOSE: 147 mg/dL — AB (ref 70–99)
Potassium: 4.6 mEq/L (ref 3.5–5.1)
Sodium: 136 mEq/L (ref 135–145)

## 2016-08-12 LAB — HEMOGLOBIN A1C: HEMOGLOBIN A1C: 8.7 % — AB (ref 4.6–6.5)

## 2016-08-12 LAB — LIPID PANEL
Cholesterol: 216 mg/dL — ABNORMAL HIGH (ref 0–200)
HDL: 37.3 mg/dL — AB (ref >39.00)
LDL Cholesterol: 144 mg/dL — ABNORMAL HIGH (ref 0–99)
NonHDL: 178.21
TRIGLYCERIDES: 172 mg/dL — AB (ref 0.0–149.0)
Total CHOL/HDL Ratio: 6
VLDL: 34.4 mg/dL (ref 0.0–40.0)

## 2016-08-12 LAB — VITAMIN D 25 HYDROXY (VIT D DEFICIENCY, FRACTURES): VITD: 13.73 ng/mL — AB (ref 30.00–100.00)

## 2016-08-12 LAB — TSH: TSH: 0.8 u[IU]/mL (ref 0.35–4.50)

## 2016-08-12 NOTE — Assessment & Plan Note (Signed)
Chronic problem.  Well controlled today.  Asymptomatic.  Check labs.  No anticipated med changes.  Will follow. 

## 2016-08-12 NOTE — Assessment & Plan Note (Signed)
Chronic problem.  Hx of good control on current meds.  UTD on foot exam.  Due for eye exam- this is scheduled.  On ACE for renal protection.  Stressed need for healthy diet and regular exercise.  Check labs.  Adjust meds prn

## 2016-08-12 NOTE — Progress Notes (Addendum)
Subjective:   Tracy Williams is a 71 y.o. female who presents for Medicare Annual (Subsequent) preventive examination.  Review of Systems:  No ROS.  Medicare Wellness Visit.  Cardiac Risk Factors include: advanced age (>58men, >97 women);diabetes mellitus;hypertension;family history of premature cardiovascular disease   Sleep patterns: Sleeps 6 hours, rarely at one time. Naps during the day.  Home Safety/Smoke Alarms: Smoke detectors and security in place.  Living environment; residence and Firearm Safety: Lives with husband in 1 story home, with ramp. Feels safe in home. Firearm safety discussed.  Seat Belt Safety/Bike Helmet: Wears seat belt.   Counseling:   Eye Exam-Last exam 2017, scheduled within the next 2 weeks. Yearly by Dr. Delman Cheadle.   Dental-Last exam > 2 years. Only with issues, Dr. Lovena Le.   Female:   Pap-N/A       Mammo-07/14/2015, negative. Ordered on 07/21/2016  Dexa scan-05/01/2014, Osteoporosis. Ordered on 07/21/2016       CCS-Colonoscopy 06/28/2011, normal. Recall 10 years. Dr. Watt Climes      Objective:     Vitals: BP 120/64 (BP Location: Right Arm, Patient Position: Sitting, Cuff Size: Large)   Pulse 83   Temp 98.4 F (36.9 C) (Oral)   Resp 16   Ht 5\' 5"  (1.651 m)   Wt 165 lb (74.8 kg)   SpO2 94%   BMI 27.46 kg/m   Body mass index is 27.46 kg/m.   Tobacco History  Smoking Status  . Never Smoker  Smokeless Tobacco  . Never Used     Counseling given: Not Answered   Past Medical History:  Diagnosis Date  . Allergy   . Anemia    hx  . Arthritis   . Diabetes mellitus without complication ([place])    Type II  . Dysrhythmia    Hx: of palpitations in 2010 only  . GERD (gastroesophageal reflux disease)   . Hypertension   . Pancreatic mass    Archie Endo 02/25/2016  . PONV (postoperative nausea and vomiting)    pt reports no PONV after biopsy on 12/24/15  . Right knee DJD   . Stress incontinence   . Urinary frequency   . Urinary urgency   . Wears  glasses    Past Surgical History:  Procedure Laterality Date  . CESAREAN SECTION  1975  . COLONOSCOPY W/ BIOPSIES AND POLYPECTOMY     Hx: of in 2012  . DILATION AND CURETTAGE OF UTERUS     Hx: of 1978  . EUS N/A 12/24/2015   Procedure: UPPER ENDOSCOPIC ULTRASOUND (EUS) RADIAL;  Surgeon: Arta Silence, MD;  Location: [facility];  Service: Endoscopy;  Laterality: N/A;  . FINE NEEDLE ASPIRATION N/A 12/24/2015   Procedure: FINE NEEDLE ASPIRATION (FNA) RADIAL;  Surgeon: Arta Silence, MD;  Location: [facility];  Service: Endoscopy;  Laterality: N/A;  . JOINT REPLACEMENT     Hx: of left Knee 2010  . LAPAROSCOPIC DISTAL PANCREATECTOMY  02/25/2016   Diagnostic laparoscopy, laparoscopic hand-assisted   . LAPAROSCOPIC SPLENECTOMY N/A 02/25/2016   Procedure: LAPAROSCOPIC SPLENECTOMY;  Surgeon: Stark Klein, MD;  Location: [facility];  Service: General;  Laterality: N/A;  . PANCREATECTOMY N/A 02/25/2016   Procedure: LAPAROSCOPIC DISTAL PANCREATECTOMY;  Surgeon: Stark Klein, MD;  Location: [facility];  Service: General;  Laterality: N/A;  . SPLENECTOMY  02/25/2016  . TOTAL KNEE ARTHROPLASTY  2010  . TOTAL KNEE ARTHROPLASTY Right 06/26/2012   Dr Noemi Chapel  . TOTAL KNEE ARTHROPLASTY Right 06/26/2012   Procedure: TOTAL KNEE ARTHROPLASTY- right;  Surgeon:  Lorn Junes, MD;  Location: [facility];  Service: Orthopedics;  Laterality: Right;   Family History  Problem Relation Age of Onset  . Diabetes Mellitus II Father   . Hypertension Father   . Cancer - Other Mother        Breast   History  Sexual Activity  . Sexual activity: Not on file    Outpatient Encounter Prescriptions as of 08/12/2016  Medication Sig  . acetaminophen (TYLENOL) 500 MG tablet Take 1,000 mg by mouth every 6 (six) hours as needed for mild pain or headache.  Marland Kitchen atorvastatin (LIPITOR) 20 MG tablet TAKE 1 TABLET (20 MG TOTAL) BY MOUTH AT BEDTIME.  [name] Sodium (COLACE PO) Take by mouth.  Marland Kitchen glipiZIDE (GLUCOTROL XL) 5 MG 24 hr tablet  Take 1 tablet (5 mg total) by mouth daily with breakfast.  . JANUVIA 100 MG tablet TAKE 1 TABLET BY MOUTH EVERY DAY  . lisinopril (PRINIVIL,ZESTRIL) 10 MG tablet TAKE 1 TABLET (10 MG TOTAL) BY MOUTH DAILY.  . ranitidine (ZANTAC) 150 MG tablet Take 150 mg by mouth daily as needed for heartburn.   Marland Kitchen glucose blood (FREESTYLE PRECISION NEO TEST) test strip Use as instructed  . Insulin Syringe-Needle U-100 (FREESTYLE PRECISION INS SYR) 31G X 5/16" 0.5 ML MISC Use to test sugar 2-3x/day as directed   Dx: E11.9  . Lancets (FREESTYLE) lancets Use one lancet each time sugars are tested, pt tests 2-3 times daily.   No facility-administered encounter medications on file as of 08/12/2016.     Activities of Daily Living In your present state of health, do you have any difficulty performing the following activities: 08/12/2016 04/19/2016  Hearing? N N  Vision? N N  Difficulty concentrating or making decisions? N N  Walking or climbing stairs? Y N  Dressing or bathing? N N  Doing errands, shopping? N N  Preparing Food and eating ? N -  Using the Toilet? N -  In the past six months, have you accidently leaked urine? N -  Do you have problems with loss of bowel control? N -  Managing your Medications? N -  Managing your Finances? N -  Housekeeping or managing your Housekeeping? N -  Some recent data might be hidden    Patient Care Team: Midge Minium, MD as PCP - General (Family Medicine) Clarene Essex, MD as Consulting Physician (Gastroenterology) Elsie Saas, MD as Consulting Physician (Orthopedic Surgery) Belva Crome, MD as Consulting Physician (Cardiology)    Assessment:    Physical assessment deferred to PCP.  Exercise Activities and Dietary recommendations Current Exercise Habits: The patient does not participate in regular exercise at present (Cares for husband), Exercise limited by: orthopedic condition(s)   Diet (meal preparation, eat out, water intake, caffeinated beverages,  dairy products, fruits and vegetables): Drinks diet soda and water.   Breakfast: Toast and peanut butter, eggs, tea Lunch: [place] sandwich Dinner: chicken, vegetables  Discussed heart heathy diet and staying active.  Goals    . Exercise 150 minutes per week (moderate activity)          Be more active, using stationary bike.       Fall Risk Fall Risk  08/12/2016 04/19/2016 08/13/2015 04/14/2015 05/08/2014  Falls in the past year? Yes No No Yes No  Number falls in past yr: 1 - - 1 -  Injury with Fall? No - - No -  Risk for fall due to : - - - Impaired balance/gait -  Risk  for fall due to (comments): - - - Tripped over cane -  Follow up Falls prevention discussed - - Falls prevention discussed -   Depression Screen PHQ 2/9 Scores 08/12/2016 04/19/2016 08/13/2015 04/14/2015  PHQ - 2 Score 0 0 0 0  PHQ- 9 Score - 0 - -  Exception Documentation - - - Patient refusal     Cognitive Function       Ad8 score reviewed for issues:  Issues making decisions: no  Less interest in hobbies / activities: no  Repeats questions, stories (family complaining): no  Trouble using ordinary gadgets (microwave, computer, phone): no  Forgets the month or year: no  Mismanaging finances: no  Remembering appts: no  Daily problems with thinking and/or memory: no Ad8 score is=0     Immunization History  Administered Date(s) Administered  . HiB (PRP-T) 01/29/2016  . Meningococcal Mcv4o 01/29/2016  . Pneumococcal Conjugate-13 05/08/2014  . Pneumococcal Polysaccharide-23 06/27/2012   Screening Tests Health Maintenance  Topic Date Due  . OPHTHALMOLOGY EXAM  05/01/2016  . MAMMOGRAM  07/13/2016  . INFLUENZA VACCINE  12/20/2016 (Originally 10/20/2016)  . TETANUS/TDAP  12/20/2016 (Originally 12/04/1964)  . Hepatitis C Screening  12/20/2016 (Originally 1945-04-27)  . HEMOGLOBIN A1C  08/18/2016  . FOOT EXAM  04/19/2017  . COLONOSCOPY  06/27/2021  . DEXA SCAN  Completed  . PNA vac Low Risk Adult   Completed      Plan:    Continue doing brain stimulating activities (puzzles, reading, adult coloring books, staying active) to keep memory sharp.   Schedule mammogram and bone scan.  Bring a copy of your advance directives to your next office visit.  I have personally reviewed and noted the following in the patient's chart:   . Medical and social history . Use of alcohol, tobacco or illicit drugs  . Current medications and supplements . Functional ability and status . Nutritional status . Physical activity . Advanced directives . List of other physicians . Hospitalizations, surgeries, and ER visits in previous 12 months . Vitals . Screenings to include cognitive, depression, and falls . Referrals and appointments  In addition, I have reviewed and discussed with patient certain preventive protocols, quality metrics, and best practice recommendations. A written personalized care plan for preventive services as well as general preventive health recommendations were provided to patient.     Gerilyn Nestle, RN  08/12/2016   Agree w/ documentation above.  [name], MD

## 2016-08-12 NOTE — Progress Notes (Signed)
   Subjective:    Patient ID: Tracy Williams, female    DOB: [birth year], 71 y.o.   MRN: [id number]  HPI DM- chronic problem. On Glipizide, Januvia w/ hx of good control.  On ACE for renal protection.  UTD on foot exam.  Pt has eye exam scheduled.  Denies symptomatic lows.  No numbness/tingling of hands/feet.  HTN- chronic problem, on Lisinopril w/ good control.  No CP, SOB, HAs, visual changes, edema.  Hyperlipidemia- chronic problem, on Lipitor 20mg  daily.  No abd pain, N/V.  Health Maintenance- due for mammo and DEXA   Review of Systems For ROS see HPI     Objective:   Physical Exam  Constitutional: She is oriented to person, place, and time. She appears well-developed and well-nourished. No distress.  HENT:  Head: Normocephalic and atraumatic.  Eyes: Conjunctivae and EOM are normal. Pupils are equal, round, and reactive to light.  Neck: Normal range of motion. Neck supple. No thyromegaly present.  Cardiovascular: Normal rate, regular rhythm, normal heart sounds and intact distal pulses.   No murmur heard. Pulmonary/Chest: Effort normal and breath sounds normal. No respiratory distress.  Abdominal: Soft. She exhibits no distension. There is no tenderness.  Musculoskeletal: She exhibits no edema.  Lymphadenopathy:    She has no cervical adenopathy.  Neurological: She is alert and oriented to person, place, and time.  Skin: Skin is warm and dry.  Psychiatric: She has a normal mood and affect. Her behavior is normal.  Vitals reviewed.         Assessment & Plan:

## 2016-08-12 NOTE — Patient Instructions (Addendum)
Follow up in 3-4 months to recheck diabetes We'll notify you of your lab results and make any changes if needed Keep up the good work on healthy diet and regular exercise- you look great! Please have your eye doctor send me a copy of their report If you do not hear from the [facility] in the next week or so- let me know! Call with any questions or concerns [place] Day!!!  Continue doing brain stimulating activities (puzzles, reading, adult coloring books, staying active) to keep memory sharp.   Schedule mammogram and bone scan.  Bring a copy of your advance directives to your next office visit.   Health Maintenance, Female Adopting a healthy lifestyle and getting preventive care can go a long way to promote health and wellness. Talk with your health care provider about what schedule of regular examinations is right for you. This is a good chance for you to check in with your provider about disease prevention and staying healthy. In between checkups, there are plenty of things you can do on your own. Experts have done a lot of research about which lifestyle changes and preventive measures are most likely to keep you healthy. Ask your health care provider for more information. Weight and diet Eat a healthy diet  Be sure to include plenty of vegetables, fruits, low-fat dairy products, and lean protein.  Do not eat a lot of foods high in solid fats, added sugars, or salt.  Get regular exercise. This is one of the most important things you can do for your health.  Most adults should exercise for at least 150 minutes each week. The exercise should increase your heart rate and make you sweat (moderate-intensity exercise).  Most adults should also do strengthening exercises at least twice a week. This is in addition to the moderate-intensity exercise. Maintain a healthy weight  Body mass index (BMI) is a measurement that can be used to identify possible weight problems. It estimates  body fat based on height and weight. Your health care provider can help determine your BMI and help you achieve or maintain a healthy weight.  For females 44 years of age and older:  A BMI below 18.5 is considered underweight.  A BMI of 18.5 to 24.9 is normal.  A BMI of 25 to 29.9 is considered overweight.  A BMI of 30 and above is considered obese. Watch levels of cholesterol and blood lipids  You should start having your blood tested for lipids and cholesterol at 71 years of age, then have this test every 5 years.  You may need to have your cholesterol levels checked more often if:  Your lipid or cholesterol levels are high.  You are older than 71 years of age.  You are at high risk for heart disease. Cancer screening Lung Cancer  Lung cancer screening is recommended for adults 38-57 years old who are at high risk for lung cancer because of a history of smoking.  A yearly low-dose CT scan of the lungs is recommended for people who:  Currently smoke.  Have quit within the past 15 years.  Have at least a 30-pack-year history of smoking. A pack year is smoking an average of one pack of cigarettes a day for 1 year.  Yearly screening should continue until it has been 15 years since you quit.  Yearly screening should stop if you develop a health problem that would prevent you from having lung cancer treatment. Breast Cancer  Practice breast self-awareness. This  means understanding how your breasts normally appear and feel.  It also means doing regular breast self-exams. Let your health care provider know about any changes, no matter how small.  If you are in your 20s or 30s, you should have a clinical breast exam (CBE) by a health care provider every 1-3 years as part of a regular health exam.  If you are 60 or older, have a CBE every year. Also consider having a breast X-ray (mammogram) every year.  If you have a family history of breast cancer, talk to your health care  provider about genetic screening.  If you are at high risk for breast cancer, talk to your health care provider about having an MRI and a mammogram every year.  Breast cancer gene (BRCA) assessment is recommended for women who have family members with BRCA-related cancers. BRCA-related cancers include:  Breast.  Ovarian.  Tubal.  Peritoneal cancers.  Results of the assessment will determine the need for genetic counseling and BRCA1 and BRCA2 testing. Cervical Cancer  Your health care provider may recommend that you be screened regularly for cancer of the pelvic organs (ovaries, uterus, and vagina). This screening involves a pelvic examination, including checking for microscopic changes to the surface of your cervix (Pap test). You may be encouraged to have this screening done every 3 years, beginning at age 3.  For women ages 70-65, health care providers may recommend pelvic exams and Pap testing every 3 years, or they may recommend the Pap and pelvic exam, combined with testing for human papilloma virus (HPV), every 5 years. Some types of HPV increase your risk of cervical cancer. Testing for HPV may also be done on women of any age with unclear Pap test results.  Other health care providers may not recommend any screening for nonpregnant women who are considered low risk for pelvic cancer and who do not have symptoms. Ask your health care provider if a screening pelvic exam is right for you.  If you have had past treatment for cervical cancer or a condition that could lead to cancer, you need Pap tests and screening for cancer for at least 20 years after your treatment. If Pap tests have been discontinued, your risk factors (such as having a new sexual partner) need to be reassessed to determine if screening should resume. Some women have medical problems that increase the chance of getting cervical cancer. In these cases, your health care provider may recommend more frequent screening and  Pap tests. Colorectal Cancer  This type of cancer can be detected and often prevented.  Routine colorectal cancer screening usually begins at 71 years of age and continues through 71 years of age.  Your health care provider may recommend screening at an earlier age if you have risk factors for colon cancer.  Your health care provider may also recommend using home test kits to check for hidden blood in the stool.  A small camera at the end of a tube can be used to examine your colon directly (sigmoidoscopy or colonoscopy). This is done to check for the earliest forms of colorectal cancer.  Routine screening usually begins at age 90.  Direct examination of the colon should be repeated every 5-10 years through 71 years of age. However, you may need to be screened more often if early forms of precancerous polyps or small growths are found. Skin Cancer  Check your skin from head to toe regularly.  Tell your health care provider about any new moles or  changes in moles, especially if there is a change in a mole's shape or color.  Also tell your health care provider if you have a mole that is larger than the size of a pencil eraser.  Always use sunscreen. Apply sunscreen liberally and repeatedly throughout the day.  Protect yourself by wearing long sleeves, pants, a wide-brimmed hat, and sunglasses whenever you are outside. Heart disease, diabetes, and high blood pressure  High blood pressure causes heart disease and increases the risk of stroke. High blood pressure is more likely to develop in:  People who have blood pressure in the high end of the normal range (130-139/85-89 mm Hg).  People who are overweight or obese.  People who are African American.  If you are 48-52 years of age, have your blood pressure checked every 3-5 years. If you are 57 years of age or older, have your blood pressure checked every year. You should have your blood pressure measured twice-once when you are at a  hospital or clinic, and once when you are not at a hospital or clinic. Record the average of the two measurements. To check your blood pressure when you are not at a hospital or clinic, you can use:  An automated blood pressure machine at a pharmacy.  A home blood pressure monitor.  If you are between 53 years and 81 years old, ask your health care provider if you should take aspirin to prevent strokes.  Have regular diabetes screenings. This involves taking a blood sample to check your fasting blood sugar level.  If you are at a normal weight and have a low risk for diabetes, have this test once every three years after 71 years of age.  If you are overweight and have a high risk for diabetes, consider being tested at a younger age or more often. Preventing infection Hepatitis B  If you have a higher risk for hepatitis B, you should be screened for this virus. You are considered at high risk for hepatitis B if:  You were born in a country where hepatitis B is common. Ask your health care provider which countries are considered high risk.  Your parents were born in a high-risk country, and you have not been immunized against hepatitis B (hepatitis B vaccine).  You have HIV or AIDS.  You use needles to inject street drugs.  You live with someone who has hepatitis B.  You have had sex with someone who has hepatitis B.  You get hemodialysis treatment.  You take certain medicines for conditions, including cancer, organ transplantation, and autoimmune conditions. Hepatitis C  Blood testing is recommended for:  Everyone born from [birth year] through 1965.  Anyone with known risk factors for hepatitis C. Sexually transmitted infections (STIs)  You should be screened for sexually transmitted infections (STIs) including gonorrhea and chlamydia if:  You are sexually active and are younger than 71 years of age.  You are older than 71 years of age and your health care provider tells you  that you are at risk for this type of infection.  Your sexual activity has changed since you were last screened and you are at an increased risk for chlamydia or gonorrhea. Ask your health care provider if you are at risk.  If you do not have HIV, but are at risk, it may be recommended that you take a prescription medicine daily to prevent HIV infection. This is called pre-exposure prophylaxis (PrEP). You are considered at risk if:  You are sexually  active and do not regularly use condoms or know the HIV status of your partner(s).  You take drugs by injection.  You are sexually active with a partner who has HIV. Talk with your health care provider about whether you are at high risk of being infected with HIV. If you choose to begin PrEP, you should first be tested for HIV. You should then be tested every 3 months for as long as you are taking PrEP. Pregnancy  If you are premenopausal and you may become pregnant, ask your health care provider about preconception counseling.  If you may become pregnant, take 400 to 800 micrograms (mcg) of folic acid every day.  If you want to prevent pregnancy, talk to your health care provider about birth control (contraception). Osteoporosis and menopause  Osteoporosis is a disease in which the bones lose minerals and strength with aging. This can result in serious bone fractures. Your risk for osteoporosis can be identified using a bone density scan.  If you are 62 years of age or older, or if you are at risk for osteoporosis and fractures, ask your health care provider if you should be screened.  Ask your health care provider whether you should take a calcium or vitamin D supplement to lower your risk for osteoporosis.  Menopause may have certain physical symptoms and risks.  Hormone replacement therapy may reduce some of these symptoms and risks. Talk to your health care provider about whether hormone replacement therapy is right for you. Follow  these instructions at home:  Schedule regular health, dental, and eye exams.  Stay current with your immunizations.  Do not use any tobacco products including cigarettes, chewing tobacco, or electronic cigarettes.  If you are pregnant, do not drink alcohol.  If you are breastfeeding, limit how much and how often you drink alcohol.  Limit alcohol intake to no more than 1 drink per day for nonpregnant women. One drink equals 12 ounces of beer, 5 ounces of wine, or 1 ounces of hard liquor.  Do not use street drugs.  Do not share needles.  Ask your health care provider for help if you need support or information about quitting drugs.  Tell your health care provider if you often feel depressed.  Tell your health care provider if you have ever been abused or do not feel safe at home. This information is not intended to replace advice given to you by your health care provider. Make sure you discuss any questions you have with your health care provider. Document Released: 09/21/2010 Document Revised: 08/14/2015 Document Reviewed: 12/10/2014 Elsevier Interactive Patient Education  2017 [employer].

## 2016-08-12 NOTE — Assessment & Plan Note (Signed)
Chronic problem.  Tolerating statin w/o difficulty.  Stressed need for healthy diet and regular exercise.  Check labs.  Adjust meds prn  

## 2016-08-12 NOTE — Assessment & Plan Note (Signed)
Chronic problem.  Check Vit D level and replete prn.  Repeat DEXA ordered.

## 2016-08-13 NOTE — Progress Notes (Signed)
Called pt and lmovm to return call.

## 2016-08-17 ENCOUNTER — Other Ambulatory Visit: Payer: Self-pay | Admitting: General Practice

## 2016-08-17 DIAGNOSIS — E119 Type 2 diabetes mellitus without complications: Secondary | ICD-10-CM

## 2016-08-17 MED ORDER — VITAMIN D (ERGOCALCIFEROL) 1.25 MG (50000 UNIT) PO CAPS: 50000.0000 [IU] | ORAL_CAPSULE | ORAL | 0 refills | Status: DC

## 2016-08-26 DIAGNOSIS — E119 Type 2 diabetes mellitus without complications: Secondary | ICD-10-CM | POA: Diagnosis not present

## 2016-08-26 LAB — HM DIABETES EYE EXAM

## 2016-09-09 ENCOUNTER — Telehealth: Payer: Self-pay | Admitting: Family Medicine

## 2016-09-09 NOTE — Telephone Encounter (Signed)
Missed call RE new pt appt.  Thank you,  -LL

## 2016-09-10 ENCOUNTER — Encounter: Payer: Self-pay | Admitting: General Practice

## 2016-09-27 ENCOUNTER — Ambulatory Visit
Admission: RE | Admit: 2016-09-27 | Discharge: 2016-09-27 | Disposition: A | Discharge status: Home / Self Care | Payer: Medicare Other | Source: Ambulatory Visit | Attending: Family Medicine | Admitting: Family Medicine

## 2016-09-27 ENCOUNTER — Encounter: Payer: Self-pay | Admitting: General Practice

## 2016-09-27 DIAGNOSIS — Z1231 Encounter for screening mammogram for malignant neoplasm of breast: Secondary | ICD-10-CM | POA: Diagnosis not present

## 2016-09-27 DIAGNOSIS — M8589 Other specified disorders of bone density and structure, multiple sites: Secondary | ICD-10-CM | POA: Diagnosis not present

## 2016-09-27 DIAGNOSIS — Z780 Asymptomatic menopausal state: Secondary | ICD-10-CM | POA: Diagnosis not present

## 2016-09-27 DIAGNOSIS — M810 Age-related osteoporosis without current pathological fracture: Secondary | ICD-10-CM

## 2016-11-10 ENCOUNTER — Ambulatory Visit (INDEPENDENT_AMBULATORY_CARE_PROVIDER_SITE_OTHER): Payer: Medicare Other | Admitting: Endocrinology

## 2016-11-10 ENCOUNTER — Encounter: Payer: Self-pay | Admitting: Endocrinology

## 2016-11-10 VITALS — BP 138/84 | HR 82 | Ht 65.0 in | Wt 166.4 lb

## 2016-11-10 DIAGNOSIS — E782 Mixed hyperlipidemia: Secondary | ICD-10-CM | POA: Diagnosis not present

## 2016-11-10 DIAGNOSIS — E1165 Type 2 diabetes mellitus with hyperglycemia: Secondary | ICD-10-CM | POA: Diagnosis not present

## 2016-11-10 LAB — POCT GLYCOSYLATED HEMOGLOBIN (HGB A1C): Hemoglobin A1C: 8.2

## 2016-11-10 MED ORDER — SITAGLIP PHOS-METFORMIN HCL ER 50-500 MG PO TB24: 1.0000 | ORAL_TABLET | Freq: Two times a day (BID) | ORAL | 3 refills | Status: DC

## 2016-11-10 NOTE — Progress Notes (Signed)
Patient ID: Tracy Williams, female   DOB: 25-Jul-1945, 71 y.o.   MRN: [id number]          Reason for Appointment: Consultation for Type 2 Diabetes  Referring physician:   History of Present Illness:          Date of diagnosis of type 2 diabetes mellitus: ?  2012       Background history:   Her blood sugars were probably mildly increased at diagnosis and she does not remember her initial readings She was apparently tried on metformin according to her records initially However she could not take it for long because of nausea and lightheadedness Subsequently she has been on Januvia mostly and also glipizide added in 2017 Her A1c has fluctuated and has been as high as10.2 in 07/2015 Although a prescription was made for Jardiance in 9/17 the patient does not recall taking this  Recent history:   Her last A1c was 8.7 when she was referred here Today it is 8.2  Non-insulin hypoglycemic drugs the patient is taking RKY:HCWCBJS 100 mg daily, glipizide ER 5 mg daily  Current management, blood sugar patterns and problems identified:  She does check her blood sugars consistently but only in the mornings with her One Touch monitor  Her blood sugars averaging about 160 with her [phone]  Her weight has stayed about the same  She says she is not able to eat consistent with diet especially with snacking  However is usually trying to eat low fat meals although he may have some higher fat meats at times  She does not exercise, has not been motivated to do so          Side effects from medications have been:nausea from metformin  Compliance with the medical regimen: fair  Glucose monitoring:  done  times a day         Glucometer: One Touch.      Blood Glucose readings by monitor download as above  Self-care: The diet that the patient has been following is: tries to limit        Typical meal intake: Breakfast is either toast and peanut butter or bacon and eggs Lunch usually  [place] sandwich Dinner either hamburger or chicken with salad and bread For snacks she will have chips or cauliflower        Avoiding drinks with sugar       Dietician visit, most recent:never               Exercise:  none  Weight history:  Wt Readings from Last 3 Encounters:  11/10/16 166 lb 6.4 oz (75.5 kg)  08/12/16 165 lb (74.8 kg)  04/19/16 163 lb 4 oz (74 kg)    Glycemic control:   Lab Results  Component Value Date   HGBA1C 8.2 11/10/2016   HGBA1C 8.7 (H) 08/12/2016   HGBA1C 6.8 (H) 02/19/2016   Lab Results  Component Value Date   LDLCALC 144 (H) 08/12/2016   CREATININE 1.08 08/12/2016   No results found for: MICRALBCREAT  No results found for: FRUCTOSAMINE    Allergies as of 11/10/2016      Reactions   Metformin And Related Nausea And Vomiting   Darvocet [propoxyphene N-acetaminophen] Nausea And Vomiting   Demerol [meperidine] Nausea And Vomiting   Hydrochlorothiazide Nausea And Vomiting   Percocet [oxycodone-acetaminophen] Nausea And Vomiting   Vicodin [hydrocodone-acetaminophen] Nausea And Vomiting      Medication List       Accurate as of  11/10/16  1:26 PM. Always use your most recent med list.          acetaminophen 500 MG tablet Commonly known as:  TYLENOL Take 1,000 mg by mouth every 6 (six) hours as needed for mild pain or headache.   atorvastatin 20 MG tablet Commonly known as:  LIPITOR TAKE 1 TABLET (20 MG TOTAL) BY MOUTH AT BEDTIME.   COLACE PO Take by mouth.   freestyle lancets Use one lancet each time sugars are tested, pt tests 2-3 times daily.   glipiZIDE 5 MG 24 hr tablet Commonly known as:  GLUCOTROL XL Take 1 tablet (5 mg total) by mouth daily with breakfast.   glucose blood test strip Commonly known as:  FREESTYLE PRECISION NEO TEST Use as instructed   Insulin Syringe-Needle U-100 31G X 5/16" 0.5 ML Misc Commonly known as:  FREESTYLE PRECISION INS SYR Use to test sugar 2-3x/day as directed   Dx: E11.9   JANUVIA 100  MG tablet Generic drug:  sitaGLIPtin TAKE 1 TABLET BY MOUTH EVERY DAY   lisinopril 10 MG tablet Commonly known as:  PRINIVIL,ZESTRIL TAKE 1 TABLET (10 MG TOTAL) BY MOUTH DAILY.   ranitidine 150 MG tablet Commonly known as:  ZANTAC Take 150 mg by mouth daily as needed for heartburn.   SitaGLIPtin-MetFORMIN HCl 50-500 MG Tb24 Commonly known as:  JANUMET XR Take 1 tablet by mouth 2 (two) times daily.   Vitamin D (Ergocalciferol) 50000 units Caps capsule Commonly known as:  DRISDOL Take 1 capsule (50,000 Units total) by mouth every 7 (seven) days.   Vitamin D3 2000 units Tabs Take 1 capsule by mouth daily.            Discharge Care Instructions        Start     Ordered   11/10/16 0000  POCT glycosylated hemoglobin (Hb A1C)     11/10/16 1151   11/10/16 0000  SitaGLIPtin-MetFORMIN HCl (JANUMET XR) 50-500 MG TB24  2 times daily     11/10/16 1151   11/10/16 9518  Basic metabolic panel     84/16/60 1151   11/10/16 0000  Fructosamine     11/10/16 1151   11/10/16 0000  LDL cholesterol, direct     11/10/16 1151      Allergies:  Allergies  Allergen Reactions  . Metformin And Related Nausea And Vomiting  . Darvocet [Propoxyphene N-Acetaminophen] Nausea And Vomiting  . Demerol [Meperidine] Nausea And Vomiting  . Hydrochlorothiazide Nausea And Vomiting  . Percocet [Oxycodone-Acetaminophen] Nausea And Vomiting  . Vicodin [Hydrocodone-Acetaminophen] Nausea And Vomiting    Past Medical History:  Diagnosis Date  . Allergy   . Anemia    hx  . Arthritis   . Diabetes mellitus without complication ([place])    Type II  . Dysrhythmia    Hx: of palpitations in 2010 only  . GERD (gastroesophageal reflux disease)   . Hypertension   . Pancreatic mass    [facility] Endo 02/25/2016  . PONV (postoperative nausea and vomiting)    pt reports no PONV after biopsy on 12/24/15  . Right knee DJD   . Stress incontinence   . Urinary frequency   . Urinary urgency   . Wears glasses     Past  Surgical History:  Procedure Laterality Date  . CESAREAN SECTION  1975  . COLONOSCOPY W/ BIOPSIES AND POLYPECTOMY     Hx: of in 2012  . DILATION AND CURETTAGE OF UTERUS     Hx: of 1978  .  EUS N/A 12/24/2015   Procedure: UPPER ENDOSCOPIC ULTRASOUND (EUS) RADIAL;  Surgeon: Arta Silence, MD;  Location: [facility];  Service: Endoscopy;  Laterality: N/A;  . FINE NEEDLE ASPIRATION N/A 12/24/2015   Procedure: FINE NEEDLE ASPIRATION (FNA) RADIAL;  Surgeon: Arta Silence, MD;  Location: [facility];  Service: Endoscopy;  Laterality: N/A;  . JOINT REPLACEMENT     Hx: of left Knee 2010  . LAPAROSCOPIC DISTAL PANCREATECTOMY  02/25/2016   Diagnostic laparoscopy, laparoscopic hand-assisted   . LAPAROSCOPIC SPLENECTOMY N/A 02/25/2016   Procedure: LAPAROSCOPIC SPLENECTOMY;  Surgeon: Stark Klein, MD;  Location: [facility];  Service: General;  Laterality: N/A;  . PANCREATECTOMY N/A 02/25/2016   Procedure: LAPAROSCOPIC DISTAL PANCREATECTOMY;  Surgeon: Stark Klein, MD;  Location: [facility];  Service: General;  Laterality: N/A;  . SPLENECTOMY  02/25/2016  . TOTAL KNEE ARTHROPLASTY  2010  . TOTAL KNEE ARTHROPLASTY Right 06/26/2012   Dr Noemi Chapel  . TOTAL KNEE ARTHROPLASTY Right 06/26/2012   Procedure: TOTAL KNEE ARTHROPLASTY- right;  Surgeon: Lorn Junes, MD;  Location: [facility];  Service: Orthopedics;  Laterality: Right;    Family History  Problem Relation Age of Onset  . Diabetes Mellitus II Father   . Hypertension Father   . Cancer - Other Mother        Breast    Social History:  reports that she has never smoked. She has never used smokeless tobacco. She reports that she does not drink alcohol or use drugs.   Review of Systems  Constitutional: Positive for malaise. Negative for weight loss.  HENT: Negative for trouble swallowing.   Eyes: Negative for visual disturbance.  Respiratory: Negative for shortness of breath.   Cardiovascular:       At times has swelling around her ankles, this is  long-standing  Gastrointestinal: Negative for nausea, constipation, diarrhea and abdominal pain.  Endocrine: Positive for fatigue. Negative for polydipsia.  Genitourinary: Negative for frequency.  Musculoskeletal: Positive for joint pain. Negative for back pain.  Skin: Negative for dry skin.  Neurological: Negative for weakness, numbness, tingling and balance difficulty.  Psychiatric/Behavioral: Negative for depressed mood.     Lipid history: Lipitor 20mg  with the following results, has been followed by PCP in 5/18    Lab Results  Component Value Date   CHOL 216 (H) 08/12/2016   HDL 37.30 (L) 08/12/2016   LDLCALC 144 (H) 08/12/2016   LDLDIRECT 188.0 12/15/2015   TRIG 172.0 (H) 08/12/2016   CHOLHDL 6 08/12/2016           Hypertension:treated with lisinopril, last BP was normal  Most recent eye exam was 08/2016  Most recent foot exam:8/18    LABS:  Office Visit on 11/10/2016  Component Date Value Ref Range Status  . Hemoglobin A1C 11/10/2016 8.2   Final    Physical Examination:  BP 138/84   Pulse 82   Ht 5\' 5"  (1.651 m)   Wt 166 lb 6.4 oz (75.5 kg)   SpO2 96%   BMI 27.69 kg/m   GENERAL:         Patient has generalized obesity.   HEENT:         Eye exam shows normal external appearance. Fundus exam shows no retinopathy. Oral exam shows normal mucosa .  NECK:   There is no lymphadenopathy Thyroid is not enlarged and no nodules felt.  Carotids are normal to palpation and no bruit heard LUNGS:         Chest is symmetrical. Lungs are clear  to auscultation.Marland Kitchen   HEART:         Heart sounds:  S1 and S2 are normal. No murmur or click heard., no S3 or S4.   ABDOMEN:   There is no distention present. Liver and spleen are not palpable. No other mass or tenderness present.   NEUROLOGICAL:   Ankle jerks are absent bilaterally.    Diabetic Foot Exam - Simple   Simple Foot Form Diabetic Foot exam was performed with the following findings:  Yes   Visual Inspection No  deformities, no ulcerations, no other skin breakdown bilaterally:  Yes Sensation Testing Intact to touch and monofilament testing bilaterally:  Yes Pulse Check Posterior Tibialis and Dorsalis pulse intact bilaterally:  Yes Comments            Vibration sense is markedly reduced in distal first toes on the left and moderately on the right. MUSCULOSKELETAL:  There is no swelling or deformity of the peripheral joints. Spine is normal to inspection.   EXTREMITIES:     There is no edema. No skin lesions present.Marland Kitchen SKIN:       No rash or lesions of concern.        ASSESSMENT:  Diabetes type 2, uncontrolled with A1c persistently ov5 She is on Januvia and glipizide Reportedly intolerant to metformin tried at the time of diagnosis Although her fasting blood sugars at home are about 160 but they probably are higher after meals, she is not doing any postprandial readings She has not been motivated to exercise Although her diet is reasonably good she can benefit from a consultation with dietitian for better meal planning   Complications of diabetes:none evident;does have definitely decreased vibration sense on her foot exam She does need a urine microalbumin checked   HYPERLIPIDEMIA: Inadequately controlled currently with 20 mg Lipitor, consider increasing the dose or changing to Crestor  Mild hypertension: Appears well-controlled   PLAN:    Since she has very limited options because of financial reasons she will need to try etformin ER again  Most likely for better tolerability she can use Janumet XR instead of metformin by itself  She will start with 50/500 of the Janumet XR once a day and if tolerated increase it to twice a day  Given her a co-pay card for a 30 day trial  She will leave off the Januvia at this time but continue glipizide  Discussed that she may be a candidate for other medications that will help long-term but they probably will be more expensive on her Medicare  plans  She will see the dietitian for meal planning  Encouraged her to cut back on snacks like chips  She will need to start doing some aerobicexercise regularly at least with her exercise bike  She will also start monitoring postprandial blood sugars at home   Patient Instructions  Check blood sugars on waking up 3-4 times a week   Also check blood sugars about 2 hours after a meal and do this after different meals by rotation  Recommended blood sugar levels on waking up is 90-130 and about 2 hours after meal is 130-160  Please bring your blood sugar monitor to each visit, thank you  JANUMET XR: Start taking 1 tablet with your main meal for the first week and then twice a day.  Do not take Januvia with this Continue glipizide unchanged  Important: Start exercise bike or other aerobic exercise like walking starting with an to 15 minutes and working up to  as much as 30 minutes daily    Consultation note has been sent to the referring physician  [facility] 11/10/2016, 1:26 PM   Note: This office note was prepared with Dragon voice recognition system technology. Any transcriptional errors that result from this process are unintentional.

## 2016-11-10 NOTE — Patient Instructions (Signed)
Check blood sugars on waking up 3-4 times a week   Also check blood sugars about 2 hours after a meal and do this after different meals by rotation  Recommended blood sugar levels on waking up is 90-130 and about 2 hours after meal is 130-160  Please bring your blood sugar monitor to each visit, thank you  JANUMET XR: Start taking 1 tablet with your main meal for the first week and then twice a day.  Do not take Januvia with this Continue glipizide unchanged  Important: Start exercise bike or other aerobic exercise like walking starting with an to 15 minutes and working up to as much as 30 minutes daily

## 2016-11-11 ENCOUNTER — Encounter: Payer: Medicare Other | Attending: Endocrinology | Admitting: Dietician

## 2016-11-11 ENCOUNTER — Encounter: Payer: Self-pay | Admitting: Dietician

## 2016-11-11 DIAGNOSIS — Z713 Dietary counseling and surveillance: Secondary | ICD-10-CM | POA: Insufficient documentation

## 2016-11-11 DIAGNOSIS — Z9889 Other specified postprocedural states: Secondary | ICD-10-CM | POA: Diagnosis not present

## 2016-11-11 DIAGNOSIS — E119 Type 2 diabetes mellitus without complications: Secondary | ICD-10-CM | POA: Diagnosis not present

## 2016-11-11 NOTE — Patient Instructions (Addendum)
Add some form of exercise most days.  (stationary bike, walking, lawn work, heavy housework)  Start with 15 minutes and increase to 30 minutes as tolerated) What can do to improve your sleep?  Increase your non starchy vegetable intake. Consider changing to more whole grains. Be sure to have dinner.  Aim for 2-3 Carb Choices per meal (30-45 grams) +/- 1 either way  Aim for 0-1 Carbs per snack if hungry  Include protein in moderation with your meals and snacks Consider reading food labels for Total Carbohydrate and Fat Grams of foods Consider checking BG at alternate times per day as directed by MD  Continue taking medication as directed by MD

## 2016-11-11 NOTE — Progress Notes (Signed)
Diabetes Self-Management Education  Visit Type: First/Initial  Appt. Start Time: 0800 Appt. End Time: 0930  11/11/2016  Ms. Tracy Williams, identified by name and date of birth, is a 71 y.o. female with a diagnosis of Diabetes: Type 2. Other hx includes surgery for a benign pancreatic mass 02/2016.  She reports losing weight after that time but gaining it back..  Labs include A1C 8.2% 11/10/16, GFR 53, cholesterol 216, HDL 37, LDL 144, Triglycerides 172, Vitamin D 13.7 08/12/16.    Labs include Januvia and Glipizide.  She will stop the Januvia when she starts Janumet.  Patient lives with her husband.  She does the shopping and cooking.  Her husband was in an accident many years ago and she cares for him.  He requires a wheelchair or walker and a pureed diet.  She does not like to cook 2 meals so sometimes eats something very easy for dinner and snacks which becomes less healthy.  He wakes her up about every 3 hours.  Poor sleep.  ASSESSMENT  Height 5\' 5"  (1.651 m), weight 166 lb (75.3 kg). Body mass index is 27.62 kg/m.      Diabetes Self-Management Education - 11/11/16 0848      Visit Information   Visit Type First/Initial     Initial Visit   Diabetes Type Type 2   Are you currently following a meal plan? No   Are you taking your medications as prescribed? Yes  Just prescribed Janumet and will start soon.   Date Diagnosed 2012     Health Coping   How would you rate your overall health? Good     Psychosocial Assessment   Patient Belief/Attitude about Diabetes Motivated to manage diabetes   Self-care barriers None   Self-management support Doctor's office;Family   Other persons present Patient   Patient Concerns Nutrition/Meal planning;Glycemic Control   Special Needs None   Preferred Learning Style No preference indicated   Learning Readiness Ready   How often do you need to have someone help you when you read instructions, pamphlets, or other written materials from your  doctor or pharmacy? 1 - Never   What is the last grade level you completed in school? 4 years college     Pre-Education Assessment   Patient understands the diabetes disease and treatment process. Needs Instruction   Patient understands incorporating nutritional management into lifestyle. Needs Instruction   Patient undertands incorporating physical activity into lifestyle. Needs Instruction   Patient understands using medications safely. Needs Instruction   Patient understands monitoring blood glucose, interpreting and using results Needs Instruction   Patient understands prevention, detection, and treatment of acute complications. Needs Instruction   Patient understands prevention, detection, and treatment of chronic complications. Needs Instruction   Patient understands how to develop strategies to address psychosocial issues. Needs Instruction   Patient understands how to develop strategies to promote health/change behavior. Needs Instruction     Complications   Last HgB A1C per patient/outside source 8.2 %  11/10/16 decreased from 8.7% 07/2016   How often do you check your blood sugar? 1-2 times/day   Fasting Blood glucose range (mg/dL) 130-179;180-200   Number of hypoglycemic episodes per month 0   Number of hyperglycemic episodes per week 1   Can you tell when your blood sugar is high? Yes  only when it is very high   What do you do if your blood sugar is high? drinks water and rest   Have you had a dilated eye exam  in the past 12 months? Yes   Have you had a dental exam in the past 12 months? No   Are you checking your feet? No     Dietary Intake   Breakfast white toast and peanut butter OR eggs, bacon or sausage, white toast with butter   Snack (morning) none   Lunch deli [place] with pickles OR tuna on rice cake    Snack (afternoon) cinamon rolls or raw cauliflower with ranch, melon, tomatoes, potatoes chips, pickles   Dinner tuna or salad O   Snack (evening) raw cauliflower  with ranch, melon, tomatoes, potato chips, pickles   Beverage(s) diet coke, diet peach tea, water     Exercise   Exercise Type ADL's  owns a stationary bike   How many days per week to you exercise? 0   How many minutes per day do you exercise? 0   Total minutes per week of exercise 0     Patient Education   Previous Diabetes Education No   Disease state  Definition of diabetes, type 1 and 2, and the diagnosis of diabetes   Nutrition management  Role of diet in the treatment of diabetes and the relationship between the three main macronutrients and blood glucose level;Food label reading, portion sizes and measuring food.;Meal options for control of blood glucose level and chronic complications.   Physical activity and exercise  Role of exercise on diabetes management, blood pressure control and cardiac health.;Helped patient identify appropriate exercises in relation to his/her diabetes, diabetes complications and other health issue.   Monitoring Purpose and frequency of SMBG.;Identified appropriate SMBG and/or A1C goals.;Daily foot exams;Yearly dilated eye exam   Chronic complications Relationship between chronic complications and blood glucose control;Dental care   Psychosocial adjustment Worked with patient to identify barriers to care and solutions;Role of stress on diabetes;Identified and addressed patients feelings and concerns about diabetes     Individualized Goals (developed by patient)   Nutrition General guidelines for healthy choices and portions discussed   Physical Activity Exercise 5-7 days per week;15 minutes per day   Medications take my medication as prescribed   Monitoring  test my blood glucose as discussed   Reducing Risk examine blood glucose patterns  MD/RD   Health Coping discuss diabetes with (comment)  MD/RD     Post-Education Assessment   Patient understands the diabetes disease and treatment process. Demonstrates understanding / competency   Patient  understands incorporating nutritional management into lifestyle. Demonstrates understanding / competency   Patient undertands incorporating physical activity into lifestyle. Demonstrates understanding / competency   Patient understands using medications safely. Demonstrates understanding / competency   Patient understands monitoring blood glucose, interpreting and using results Demonstrates understanding / competency   Patient understands prevention, detection, and treatment of acute complications. Demonstrates understanding / competency   Patient understands prevention, detection, and treatment of chronic complications. Demonstrates understanding / competency   Patient understands how to develop strategies to address psychosocial issues. Demonstrates understanding / competency   Patient understands how to develop strategies to promote health/change behavior. Demonstrates understanding / competency     Outcomes   Expected Outcomes Demonstrated interest in learning. Expect positive outcomes   Future DMSE PRN   Program Status Completed      Individualized Plan for Diabetes Self-Management Training:   Learning Objective:  Patient will have a greater understanding of diabetes self-management. Patient education plan is to attend individual and/or group sessions per assessed needs and concerns.   Plan:   Patient  Instructions  Add some form of exercise most days.  (stationary bike, walking, lawn work, heavy housework)  Start with 15 minutes and increase to 30 minutes as tolerated) What can do to improve your sleep?  Increase your non starchy vegetable intake. Consider changing to more whole grains. Be sure to have dinner.  Aim for 2-3 Carb Choices per meal (30-45 grams) +/- 1 either way  Aim for 0-1 Carbs per snack if hungry  Include protein in moderation with your meals and snacks Consider reading food labels for Total Carbohydrate and Fat Grams of foods Consider checking BG at alternate  times per day as directed by MD  Continue taking medication as directed by MD      Expected Outcomes:  Demonstrated interest in learning. Expect positive outcomes  Education material provided: Living Well with Diabetes, Food label handouts, A1C conversion sheet, Meal plan card, My Plate and Snack sheet  If problems or questions, patient to contact team via:  Phone  Future DSME appointment: PRN

## 2016-11-13 ENCOUNTER — Other Ambulatory Visit: Payer: Self-pay | Admitting: Family Medicine

## 2016-12-02 ENCOUNTER — Encounter: Payer: Self-pay | Admitting: Family Medicine

## 2016-12-02 ENCOUNTER — Ambulatory Visit (INDEPENDENT_AMBULATORY_CARE_PROVIDER_SITE_OTHER): Payer: Medicare Other | Admitting: Family Medicine

## 2016-12-02 ENCOUNTER — Other Ambulatory Visit: Payer: Self-pay | Admitting: Family Medicine

## 2016-12-02 VITALS — BP 128/82 | HR 63 | Temp 98.1°F | Resp 16 | Ht 65.0 in | Wt 165.1 lb

## 2016-12-02 DIAGNOSIS — E119 Type 2 diabetes mellitus without complications: Secondary | ICD-10-CM | POA: Diagnosis not present

## 2016-12-02 DIAGNOSIS — R7989 Other specified abnormal findings of blood chemistry: Secondary | ICD-10-CM

## 2016-12-02 LAB — BASIC METABOLIC PANEL
BUN: 22 mg/dL (ref 6–23)
CALCIUM: 9.6 mg/dL (ref 8.4–10.5)
CO2: 29 meq/L (ref 19–32)
Chloride: 102 mEq/L (ref 96–112)
Creatinine, Ser: 1.22 mg/dL — ABNORMAL HIGH (ref 0.40–1.20)
GFR: 46.18 mL/min — AB (ref >60.00)
Glucose, Bld: 163 mg/dL — ABNORMAL HIGH (ref 70–99)
POTASSIUM: 4.9 meq/L (ref 3.5–5.1)
Sodium: 138 mEq/L (ref 135–145)

## 2016-12-02 LAB — HEMOGLOBIN A1C: Hgb A1c MFr Bld: 8.3 % — ABNORMAL HIGH (ref 4.6–6.5)

## 2016-12-02 NOTE — Progress Notes (Signed)
   Subjective:    Patient ID: Tracy Williams, female    DOB: May 26, 1945, 71 y.o.   MRN: [id number]  HPI DM- chronic problem.  On Glipizide and Janumet after seeing Dr Dwyane Dee.  On ACE for renal protection.  UTD on foot exam, eye exam.  Home sugars are 'so good'.  Pt is hopefully A1C will be better.  Denies symptomatic lows.  No CP, SOB, HAs, visual changes, edema.   Review of Systems For ROS see HPI     Objective:   Physical Exam  Constitutional: She is oriented to person, place, and time. She appears well-developed and well-nourished. No distress.  HENT:  Head: Normocephalic and atraumatic.  Eyes: Pupils are equal, round, and reactive to light. Conjunctivae and EOM are normal.  Neck: Normal range of motion. Neck supple. No thyromegaly present.  Cardiovascular: Normal rate, regular rhythm, normal heart sounds and intact distal pulses.   No murmur heard. Pulmonary/Chest: Effort normal and breath sounds normal. No respiratory distress.  Abdominal: Soft. She exhibits no distension. There is no tenderness.  Musculoskeletal: She exhibits no edema.  Lymphadenopathy:    She has no cervical adenopathy.  Neurological: She is alert and oriented to person, place, and time.  Skin: Skin is warm and dry.  Psychiatric: She has a normal mood and affect. Her behavior is normal.  Vitals reviewed.         Assessment & Plan:

## 2016-12-02 NOTE — Assessment & Plan Note (Signed)
Chronic problem.  Pt feels much better since Dr Dwyane Dee adjusted her medication.  UTD on foot exam, eye exam.  On ACE for renal protection.  Check labs.  Adjust meds prn

## 2016-12-02 NOTE — Progress Notes (Signed)
Pre visit review using our clinic review tool, if applicable. No additional management support is needed unless otherwise documented below in the visit note. 

## 2016-12-02 NOTE — Patient Instructions (Signed)
Follow up in 3-4 months to recheck sugar, BP, and cholesterol We'll notify you of your lab results and make any changes if needed Continue to work on healthy diet and regular exercise- you look great! Call with any questions or concerns Stay Safe this weekend!!!

## 2016-12-09 ENCOUNTER — Other Ambulatory Visit: Payer: Self-pay | Admitting: Pharmacist

## 2016-12-09 ENCOUNTER — Encounter: Payer: Self-pay | Admitting: Pharmacist

## 2016-12-09 NOTE — Patient Outreach (Signed)
[facility]) Care Management  [place]   12/09/2016  Tracy Williams 24-Jul-1945 [id number]  Subjective:  [facility] Pharmacist received message from patient requesting a call.  Successful phone outreach to patient---HIPAA details verified.  Patient reports she is needing medication patient assistance with Janumet.    Patient is self referring herself to [facility] and consents to services of [facility] Pharmacist.    Patient has a past medical history significant for:  Hypertension, Type 2 diabetes mellitus, hyperlipidemia.    Patient reports her PCP is Dr Birdie Riddle and endocrinologist is Dr Dwyane Dee.    Patient was willing to review medications with [facility] Pharmacist over the phone.    Objective:   Current Medications: Current Outpatient Prescriptions  Medication Sig Dispense Refill  . atorvastatin (LIPITOR) 20 MG tablet TAKE 1 TABLET (20 MG TOTAL) BY MOUTH AT BEDTIME. 90 tablet 1  . Cholecalciferol (VITAMIN D3) 2000 units TABS Take 1 capsule by mouth daily.    Marland Kitchen glipiZIDE (GLUCOTROL XL) 5 MG 24 hr tablet Take 1 tablet (5 mg total) by mouth daily with breakfast. 90 tablet 1  . lisinopril (PRINIVIL,ZESTRIL) 10 MG tablet TAKE 1 TABLET (10 MG TOTAL) BY MOUTH DAILY. 90 tablet 1  . ranitidine (ZANTAC) 150 MG tablet Take 150 mg by mouth daily as needed for heartburn.     . SitaGLIPtin-MetFORMIN HCl (JANUMET XR) 50-500 MG TB24 Take 1 tablet by mouth 2 (two) times daily. 60 tablet 3  . acetaminophen (TYLENOL) 500 MG tablet Take 1,000 mg by mouth every 6 (six) hours as needed for mild pain or headache.    [name] Sodium (COLACE PO) Take 1 capsule by mouth.     Marland Kitchen glucose blood (FREESTYLE PRECISION NEO TEST) test strip Use as instructed 100 each 12  . Insulin Syringe-Needle U-100 (FREESTYLE PRECISION INS SYR) 31G X 5/16" 0.5 ML MISC Use to test sugar 2-3x/day as directed   Dx: E11.9 100 each 3  . Lancets (FREESTYLE) lancets Use one lancet each time sugars are tested, pt tests 2-3 times  daily. 100 each 12   No current facility-administered medications for this visit.     Functional Status: In your present state of health, do you have any difficulty performing the following activities: 08/12/2016 04/19/2016  Hearing? N N  Vision? N N  Difficulty concentrating or making decisions? N N  Walking or climbing stairs? Y N  Comment Down steps more difficult d/t knee replacements -  Dressing or bathing? N N  Doing errands, shopping? N N  Preparing Food and eating ? N -  Using the Toilet? N -  In the past six months, have you accidently leaked urine? N -  Do you have problems with loss of bowel control? N -  Managing your Medications? N -  Managing your Finances? N -  Housekeeping or managing your Housekeeping? N -  Some recent data might be hidden    Fall/Depression Screening: Fall Risk  11/11/2016 08/12/2016 04/19/2016  Falls in the past year? No Yes No  Number falls in past yr: - 1 -  Injury with Fall? - No -  Risk for fall due to : - - -  Risk for fall due to: Comment - - -  Follow up - Falls prevention discussed -   PHQ 2/9 Scores 11/11/2016 08/12/2016 04/19/2016 08/13/2015 04/14/2015 05/08/2014 02/21/2013  PHQ - 2 Score 0 0 0 0 0 0 0  PHQ- 9 Score - - 0 - - - -  Exception Documentation - - - - Patient refusal - -    Assessment:  Medication review per patient report: Drugs sorted by system:  Cardiovascular: -atorvastatin  -lisinopril   Gastrointestinal: -docusate  -ranitidine   Endocrine: -glipizide ER  -sitagliptin/metformin (Janumet ER)    Pain: -acetaminophen as needed  Vitamins/Minerals: -vitamin D   Medication patient assistance:  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   [employer] patient assistance program---discussed application process, eligibility requirements---patient wishes to apply.  She understands there is no guarantee she will be eligible for manufacturer patient assistance.    Plan:  Will mail patient [employer] patient assistance application---address in chart verified with patient.   Will route note to PCP as an update.   Will have [place] or myself follow-up with patient in the next 2 weeks.    Karrie Meres, PharmD, [place] [phone]

## 2016-12-14 ENCOUNTER — Telehealth: Payer: Self-pay | Admitting: General Practice

## 2016-12-14 MED ORDER — GLIPIZIDE 5 MG PO TABS
5.0000 mg | ORAL_TABLET | Freq: Two times a day (BID) | ORAL | 6 refills | Status: DC
Start: 2016-12-14 — End: 2017-03-28

## 2016-12-14 NOTE — Telephone Encounter (Signed)
Received a fax form [facility] in regards to pt medications. Per pharmacy, pt would prefer glipizide regular release instead of glipizide Er due to cost. Please advise?

## 2016-12-14 NOTE — Telephone Encounter (Signed)
Ok to change to Glipizide 5mg  BID

## 2016-12-14 NOTE — Telephone Encounter (Signed)
Medication filled to pharmacy as requested.   

## 2016-12-15 ENCOUNTER — Telehealth: Payer: Self-pay | Admitting: Endocrinology

## 2016-12-15 ENCOUNTER — Other Ambulatory Visit: Payer: Self-pay | Admitting: Pharmacist

## 2016-12-15 ENCOUNTER — Other Ambulatory Visit: Payer: Self-pay | Admitting: Pharmacy Technician

## 2016-12-15 NOTE — Patient Outreach (Signed)
[place]) Care Management  12/15/2016  KACHINA NIEDERER Nov 20, 1945 [id number]  I called to verify if a patient assistance application that was sent interoffice for [employer] [facility]) was received. I left a message with the receptionist to have someone to contact me to follow-up.   Doreene Burke, [place] [phone]

## 2016-12-15 NOTE — Patient Outreach (Signed)
[facility]) Care Management  12/15/2016  Tracy Williams Apr 12, 1945 [id number]  Received a message from patient requesting call back.  Successful outreach to patient, HIPAA details verified.  Patient inquiring about status of patient assistance application for [employer] that was to be mailed to her.  Discussed with patient, appears letter with details on application was printed 12/10/16, and it may not have reached the mail until Monday.    Discussed with patient if she does not receive application by Friday 11/18/54 to contact [facility] Pharmacist or [place], and another one can be sent.    She agreed.   Plan:  Updated [place] technician will continue to follow-up with patient.   Karrie Meres, PharmD, [place] [phone]

## 2016-12-15 NOTE — Telephone Encounter (Signed)
Crystal with [facility] called in reference to sending inner office form for patient assistance application for patient. Please call Crystal and advise if this was received.

## 2016-12-17 NOTE — Telephone Encounter (Signed)
Has anyone seen this form? Please advise.

## 2016-12-17 NOTE — Telephone Encounter (Signed)
This will be completed by Monday

## 2016-12-20 ENCOUNTER — Other Ambulatory Visit: Payer: Self-pay | Admitting: Pharmacist

## 2016-12-20 ENCOUNTER — Telehealth: Payer: Self-pay

## 2016-12-20 ENCOUNTER — Other Ambulatory Visit (INDEPENDENT_AMBULATORY_CARE_PROVIDER_SITE_OTHER): Payer: Medicare Other

## 2016-12-20 DIAGNOSIS — E782 Mixed hyperlipidemia: Secondary | ICD-10-CM

## 2016-12-20 DIAGNOSIS — R7989 Other specified abnormal findings of blood chemistry: Secondary | ICD-10-CM

## 2016-12-20 DIAGNOSIS — E1165 Type 2 diabetes mellitus with hyperglycemia: Secondary | ICD-10-CM

## 2016-12-20 LAB — BASIC METABOLIC PANEL
BUN: 23 mg/dL (ref 6–23)
CALCIUM: 9.3 mg/dL (ref 8.4–10.5)
CO2: 30 mEq/L (ref 19–32)
CREATININE: 0.97 mg/dL (ref 0.40–1.20)
Chloride: 103 mEq/L (ref 96–112)
GFR: 60.16 mL/min (ref >60.00)
GLUCOSE: 114 mg/dL — AB (ref 70–99)
Potassium: 4.6 mEq/L (ref 3.5–5.1)
Sodium: 138 mEq/L (ref 135–145)

## 2016-12-20 LAB — LDL CHOLESTEROL, DIRECT: Direct LDL: 98 mg/dL

## 2016-12-20 LAB — MICROALBUMIN / CREATININE URINE RATIO
Creatinine,U: 126.8 mg/dL
Microalb Creat Ratio: 0.6 mg/g (ref 0.0–30.0)

## 2016-12-20 NOTE — Telephone Encounter (Signed)
Tracy Williams from [facility] called me and stated that she needed the physical form for [facility] Care Management and I let her know that I put the paperwork in the envelope and will have it up front for her under Ms. Better last name in the folder.

## 2016-12-20 NOTE — Telephone Encounter (Signed)
I have received the form from Dr. Dwyane Dee and I have completed the dosage for the Janumet XR and have given this paperwork to [facility] who is working in the front office today to send out.

## 2016-12-20 NOTE — Telephone Encounter (Signed)
Called Crystal with [facility] and left a voice message to let her know that we received the form on Friday and she should be receiving the fax from our office today.

## 2016-12-20 NOTE — Telephone Encounter (Signed)
Correction, Tracy Williams stated she is doing calls so I gave to [place] to fax and make a copy for our records.

## 2016-12-20 NOTE — Patient Outreach (Signed)
[place]) Care Management  12/20/2016  Tracy Williams 05/16/1945 [id number]  Received call from patient, HIPAA details verified.  Verbal consent received from patient 12/09/16.    She reports she did not receive [employer] patient assistance form in mail (sitagliptin/metformin) yet.  Patient reports she will be at Dr Ronnie Derby office for lab work this morning and she would prefer to stop by [facility] main office to complete patient assistance form as it is near MD office.   Provided patient with address and directions to [facility] office.   Patient came to [place] office as she indicated, ~1115, and she completed [employer] patient assistance form.   Discussed patient assistance process with patient and that application would be sent to [employer] patient assistance for evaluation when prescriber portion is received.   Plan:  Patient's portion of application given to [place], Amadeo Garnet, PharmD, [place] [phone]

## 2016-12-21 LAB — FRUCTOSAMINE: Fructosamine: 232 umol/L (ref 0–285)

## 2016-12-23 ENCOUNTER — Other Ambulatory Visit: Payer: Self-pay | Admitting: Pharmacy Technician

## 2016-12-23 NOTE — Patient Outreach (Signed)
[place] [facility]) Care Management  12/23/2016  Tracy Williams 06-03-1945 [id number]   I contacted patient today to notify her that her [employer] patient assistance application for Janumet has been mailed. I explained what would happen next in the process and asked her to notify me once she mails back her attestation to [employer]. I will be able to follow-up with [employer] after her attestation has been received by the company to see whether or not the patient has been approved for assistance.  Doreene Burke, [place] [phone]

## 2016-12-24 ENCOUNTER — Ambulatory Visit: Payer: Medicare Other | Admitting: Endocrinology

## 2016-12-27 ENCOUNTER — Ambulatory Visit (INDEPENDENT_AMBULATORY_CARE_PROVIDER_SITE_OTHER): Payer: Medicare Other | Admitting: Endocrinology

## 2016-12-27 ENCOUNTER — Other Ambulatory Visit: Payer: Self-pay | Admitting: Family Medicine

## 2016-12-27 ENCOUNTER — Encounter: Payer: Self-pay | Admitting: Endocrinology

## 2016-12-27 VITALS — BP 134/74 | HR 70 | Ht 65.0 in | Wt 162.2 lb

## 2016-12-27 DIAGNOSIS — E1165 Type 2 diabetes mellitus with hyperglycemia: Secondary | ICD-10-CM | POA: Diagnosis not present

## 2016-12-27 DIAGNOSIS — Z23 Encounter for immunization: Secondary | ICD-10-CM

## 2016-12-27 NOTE — Progress Notes (Signed)
Patient ID: Tracy Williams, female   DOB: [birth year], 71 y.o.   MRN: [id number]          Reason for Appointment:  for Type 2 Diabetes  Referring physician: Birdie Riddle   History of Present Illness:          Date of diagnosis of type 2 diabetes mellitus: ?  2012       Background history:   Her blood sugars were probably mildly increased at diagnosis and she does not remember her initial readings She was apparently tried on metformin according to her records initially However she could not take it for long because of nausea and lightheadedness Subsequently she has been on Januvia mostly and also glipizide added in 2017 Her A1c has fluctuated and has been as high as10.2 in 07/2015 Although a prescription was made for Jardiance in 9/17 the patient does not recall taking this  Recent history:   Her A1c has been consistently over 8% the last 3 times and was 8.3 last month  Non-insulin hypoglycemic drugs the patient is taking are: Janumet XR 50/500, mg daily, glipizide ER 5 mg daily  Current management, blood sugar patterns and problems identified:  She was tried on Janumet XR because of her blood sugars being overall high and averaging about 160 fasting prior to her visit  Although she had previously had nausea with metformin and could not take it she has been able to tolerate 1 tablet of the Janumet XR now; did have difficulty with this the first day and also with 2 tablets she could not function well with nausea and feeling weak  She is also applying to company patient assistance program to get the medication the bottom  She has also lost about 4 pounds with her changing her diet somewhat and seeing the dietitian   Her blood sugars previously were averaging 160 but now they are below 120 and fairly close to normal often  She also has done some readings after meals and these are only occasionally high       Side effects from medications have been:nausea from metformin  Compliance  with the medical regimen: fair  Glucose monitoring:  done 1 times a day         Glucometer: One Touch.      Blood Glucose readings by monitor download  Mean values apply above for all meters except median for One Touch  PRE-MEAL Fasting Lunch Dinner Bedtime Overall  Glucose range: 87-135       Mean/median:  112     120    POST-MEAL PC Breakfast PC Lunch PC Dinner  Glucose range:   10 9-210   Mean/median:   152     Self-care: The diet that the patient has been following is: tries to limit fats       Typical meal intake: Breakfast is either toast and peanut butter or bacon and eggs Lunch usually [place] sandwich Dinner either hamburger or chicken with salad and bread For snacks she Usually has low fat items        Avoiding drinks with sugar       Dietician visit, most recent: 8/18               Exercise: walks some  Weight history:  Wt Readings from Last 3 Encounters:  12/27/16 162 lb 3.2 oz (73.6 kg)  12/02/16 165 lb 2 oz (74.9 kg)  11/11/16 166 lb (75.3 kg)    Glycemic control:   Lab  Results  Component Value Date   HGBA1C 8.3 (H) 12/02/2016   HGBA1C 8.2 11/10/2016   HGBA1C 8.7 (H) 08/12/2016   Lab Results  Component Value Date   MICROALBUR <0.7 12/20/2016   LDLCALC 144 (H) 08/12/2016   CREATININE 0.97 12/20/2016   Lab Results  Component Value Date   MICRALBCREAT 0.6 12/20/2016    Lab Results  Component Value Date   FRUCTOSAMINE 232 12/20/2016      Allergies as of 12/27/2016      Reactions   Metformin And Related Nausea And Vomiting   Darvocet [propoxyphene N-acetaminophen] Nausea And Vomiting   Demerol [meperidine] Nausea And Vomiting   Hydrochlorothiazide Nausea And Vomiting   Percocet [oxycodone-acetaminophen] Nausea And Vomiting   Vicodin [hydrocodone-acetaminophen] Nausea And Vomiting      Medication List       Accurate as of 12/27/16 12:33 PM. Always use your most recent med list.          acetaminophen 500 MG tablet Commonly known  as:  TYLENOL Take 1,000 mg by mouth every 6 (six) hours as needed for mild pain or headache.   atorvastatin 20 MG tablet Commonly known as:  LIPITOR TAKE 1 TABLET (20 MG TOTAL) BY MOUTH AT BEDTIME.   COLACE PO Take 1 capsule by mouth.   freestyle lancets Use one lancet each time sugars are tested, pt tests 2-3 times daily.   glipiZIDE 5 MG tablet Commonly known as:  GLUCOTROL Take 1 tablet (5 mg total) by mouth 2 (two) times daily before a meal.   glucose blood test strip Commonly known as:  FREESTYLE PRECISION NEO TEST Use as instructed   lisinopril 10 MG tablet Commonly known as:  PRINIVIL,ZESTRIL TAKE 1 TABLET (10 MG TOTAL) BY MOUTH DAILY.   ranitidine 150 MG tablet Commonly known as:  ZANTAC Take 150 mg by mouth daily as needed for heartburn.   SitaGLIPtin-MetFORMIN HCl 50-500 MG Tb24 Commonly known as:  JANUMET XR Take 1 tablet by mouth 2 (two) times daily.   Vitamin D3 2000 units Tabs Take 1 capsule by mouth daily.       Allergies:  Allergies  Allergen Reactions  . Metformin And Related Nausea And Vomiting  . Darvocet [Propoxyphene N-Acetaminophen] Nausea And Vomiting  . Demerol [Meperidine] Nausea And Vomiting  . Hydrochlorothiazide Nausea And Vomiting  . Percocet [Oxycodone-Acetaminophen] Nausea And Vomiting  . Vicodin [Hydrocodone-Acetaminophen] Nausea And Vomiting    Past Medical History:  Diagnosis Date  . Allergy   . Anemia    hx  . Arthritis   . Diabetes mellitus without complication ([place])    Type II  . Dysrhythmia    Hx: of palpitations in 2010 only  . GERD (gastroesophageal reflux disease)   . Hypertension   . Pancreatic mass    Archie Endo 02/25/2016  . PONV (postoperative nausea and vomiting)    pt reports no PONV after biopsy on 12/24/15  . Right knee DJD   . Stress incontinence   . Urinary frequency   . Urinary urgency   . Wears glasses     Past Surgical History:  Procedure Laterality Date  . CESAREAN SECTION  1975  . COLONOSCOPY  W/ BIOPSIES AND POLYPECTOMY     Hx: of in 2012  . DILATION AND CURETTAGE OF UTERUS     Hx: of 1978  . EUS N/A 12/24/2015   Procedure: UPPER ENDOSCOPIC ULTRASOUND (EUS) RADIAL;  Surgeon: Arta Silence, MD;  Location: [facility];  Service: Endoscopy;  Laterality: N/A;  .  FINE NEEDLE ASPIRATION N/A 12/24/2015   Procedure: FINE NEEDLE ASPIRATION (FNA) RADIAL;  Surgeon: Arta Silence, MD;  Location: [facility];  Service: Endoscopy;  Laterality: N/A;  . JOINT REPLACEMENT     Hx: of left Knee 2010  . LAPAROSCOPIC DISTAL PANCREATECTOMY  02/25/2016   Diagnostic laparoscopy, laparoscopic hand-assisted   . LAPAROSCOPIC SPLENECTOMY N/A 02/25/2016   Procedure: LAPAROSCOPIC SPLENECTOMY;  Surgeon: Stark Klein, MD;  Location: [facility];  Service: General;  Laterality: N/A;  . PANCREATECTOMY N/A 02/25/2016   Procedure: LAPAROSCOPIC DISTAL PANCREATECTOMY;  Surgeon: Stark Klein, MD;  Location: [facility];  Service: General;  Laterality: N/A;  . SPLENECTOMY  02/25/2016  . TOTAL KNEE ARTHROPLASTY  2010  . TOTAL KNEE ARTHROPLASTY Right 06/26/2012   Dr Noemi Chapel  . TOTAL KNEE ARTHROPLASTY Right 06/26/2012   Procedure: TOTAL KNEE ARTHROPLASTY- right;  Surgeon: Lorn Junes, MD;  Location: [facility];  Service: Orthopedics;  Laterality: Right;    Family History  Problem Relation Age of Onset  . Diabetes Mellitus II Father   . Hypertension Father   . Cancer - Other Mother        Breast    Social History:  reports that she has never smoked. She has never used smokeless tobacco. She reports that she does not drink alcohol or use drugs.   Review of Systems   Lipid history: Lipitor 20mg  with the following results, has been followed by PCP In the past LDL appears improved    Lab Results  Component Value Date   CHOL 216 (H) 08/12/2016   HDL 37.30 (L) 08/12/2016   LDLCALC 144 (H) 08/12/2016   LDLDIRECT 98.0 12/20/2016   TRIG 172.0 (H) 08/12/2016   CHOLHDL 6 08/12/2016           Hypertension:treated with  lisinopril, last BP was normal  Most recent eye exam was 08/2016  Most recent foot exam:8/18    LABS:  No visits with results within 1 Week(s) from this visit.  Latest known visit with results is:  Appointment on 12/20/2016  Component Date Value Ref Range Status  . Sodium 12/20/2016 138  135 - 145 mEq/L Final  . Potassium 12/20/2016 4.6  3.5 - 5.1 mEq/L Final  . Chloride 12/20/2016 103  96 - 112 mEq/L Final  . CO2 12/20/2016 30  19 - 32 mEq/L Final  . Glucose, Bld 12/20/2016 114* 70 - 99 mg/dL Final  . BUN 12/20/2016 23  6 - 23 mg/dL Final  . Creatinine, Ser 12/20/2016 0.97  0.40 - 1.20 mg/dL Final  . Calcium 12/20/2016 9.3  8.4 - 10.5 mg/dL Final  . GFR 12/20/2016 60.16  >60.00 mL/min Final  . Fructosamine 12/20/2016 232  0 - 285 umol/L Final   Comment: Published reference interval for apparently healthy subjects between age 87 and 35 is 74 - 285 umol/L and in a poorly controlled diabetic population is 228 - 563 umol/L with a mean of 396 umol/L.   Marland Kitchen Direct LDL 12/20/2016 98.0  mg/dL Final   Optimal:  <100 mg/dLNear or Above Optimal:  100-129 mg/dLBorderline High:  130-159 mg/dLHigh:  160-189 mg/dLVery High:  >190 mg/dL  . Microalb, Ur 12/20/2016 <0.7  0.0 - 1.9 mg/dL Final  . Creatinine,U 12/20/2016 126.8  mg/dL Final  . Microalb Creat Ratio 12/20/2016 0.6  0.0 - 30.0 mg/g Final    Physical Examination:  BP 134/74   Pulse 70   Ht 5\' 5"  (1.651 m)   Wt 162 lb 3.2 oz (73.6 kg)  SpO2 96%   PF (!) 6 L/min   BMI 26.99 kg/m       ASSESSMENT:  Diabetes type 2,  with A1c persistently Over 8% in the past  See history of present illness for  discussion of current diabetes management, blood sugar patterns and problems identified  She has done remarkably better with adding only 500 mg metformin ER to her Januvia and also improving her diet.  Has lost a little weight also Blood sugars are excellent at home and fructosamine is indicating good control at 232 Hopefully she  can get the medication through patient assistance program   HYPERLIPIDEMIA: LDL is below 100 now This may be better with improved diet and will continue Lipitor  Mild hypertension: well-controlled   PLAN:    She will continue her regimen unchanged including Janumet XR  Since the 50 mg Januvia is not available in combination with 500 mg metformin ER will have her continue 1 tablet daily for now  She will get this on the patient assistance program  Discussed potential for hypoglycemia with glipizide and she was given a handout of hypoglycemia recognition and treatment  She will continue to monitor blood sugars periodically at home about once a day with at least half of the readings after meals  Encouraged her to be as active as possible for weight loss A1c to be done on her next visit   She agrees to do influenza vaccine today and high dose given   Patient Instructions  Check blood sugars on waking up  2-3/7 days  Also check blood sugars about 2 hours after a meal and do this after different meals by rotation  Recommended blood sugar levels on waking up is 90-130 and about 2 hours after meal is 130-160  Please bring your blood sugar monitor to each visit, thank you  Walk daily!      Leena Tiede 12/27/2016, 12:33 PM   Note: This office note was prepared with Dragon voice recognition system technology. Any transcriptional errors that result from this process are unintentional.

## 2016-12-27 NOTE — Patient Instructions (Signed)
Check blood sugars on waking up  2-3/7 days  Also check blood sugars about 2 hours after a meal and do this after different meals by rotation  Recommended blood sugar levels on waking up is 90-130 and about 2 hours after meal is 130-160  Please bring your blood sugar monitor to each visit, thank you  Walk daily!

## 2017-01-13 ENCOUNTER — Other Ambulatory Visit: Payer: Self-pay | Admitting: Pharmacy Technician

## 2017-01-13 NOTE — Patient Outreach (Signed)
[facility]) Care Management  01/13/2017  Tracy Williams Feb 17, 1946 [id number]  I contacted [employer] patient assistance program in reference to application that was submitted for Janumet. The patient was approved on 01/11/2017 through the end of the year. I was informed that it takes the pharmacy 3 business days to submit the prescription to [facility]. After the prescription is received the patient should receive the medication in 7-10 business days.  I have informed [name] that her application has been approved and the approximate time that she should receive the medication. HIPAA identifiers verified and verbal consent received. When asked she has enough medication to last the patient states she has plenty.  PLAN:  1. I will contact [facility] in 2 weeks to make sure that medication has been shipped to patient.  2. I will follow-up with patient after the date [facility] states medication should be received to ensure that she got it.  Doreene Burke, [place] [phone]

## 2017-01-16 ENCOUNTER — Emergency Department (HOSPITAL_COMMUNITY)
Admission: EM | Admit: 2017-01-16 | Discharge: 2017-01-17 | Disposition: A | Discharge status: Home / Self Care | Payer: Medicare Other | Attending: Emergency Medicine | Admitting: Emergency Medicine

## 2017-01-16 ENCOUNTER — Encounter (HOSPITAL_COMMUNITY): Payer: Self-pay | Admitting: *Deleted

## 2017-01-16 DIAGNOSIS — E119 Type 2 diabetes mellitus without complications: Secondary | ICD-10-CM | POA: Insufficient documentation

## 2017-01-16 DIAGNOSIS — Z7984 Long term (current) use of oral hypoglycemic drugs: Secondary | ICD-10-CM | POA: Diagnosis not present

## 2017-01-16 DIAGNOSIS — R220 Localized swelling, mass and lump, head: Secondary | ICD-10-CM | POA: Diagnosis not present

## 2017-01-16 DIAGNOSIS — T783XXA Angioneurotic edema, initial encounter: Secondary | ICD-10-CM | POA: Insufficient documentation

## 2017-01-16 DIAGNOSIS — I10 Essential (primary) hypertension: Secondary | ICD-10-CM | POA: Diagnosis not present

## 2017-01-16 DIAGNOSIS — Z79899 Other long term (current) drug therapy: Secondary | ICD-10-CM | POA: Insufficient documentation

## 2017-01-16 DIAGNOSIS — R030 Elevated blood-pressure reading, without diagnosis of hypertension: Secondary | ICD-10-CM | POA: Diagnosis not present

## 2017-01-16 DIAGNOSIS — T7840XA Allergy, unspecified, initial encounter: Secondary | ICD-10-CM | POA: Diagnosis present

## 2017-01-16 NOTE — ED Notes (Signed)
The pts tongue is less swollen  She is asking to go home    Shed is also swallowing better

## 2017-01-16 NOTE — ED Provider Notes (Signed)
[facility] EMERGENCY DEPARTMENT Provider Note   CSN: [id number] Arrival date & time: 01/16/17  2149     History   Chief Complaint Chief Complaint  Patient presents with  . Allergic Reaction    HPI Tracy Williams is a 71 y.o. female.  Developed tongue swelling onset 7 PM tonight after taking lisinopril.  She denies difficulty swallowing or difficulty breathing.  No other complaint.  She treated herself with Benadryl 50 mg prior to coming here.  She was treated with an EpiPen by EMS she was beginning to feel improved upon arrival here.  No other associated symptoms  HPI  Past Medical History:  Diagnosis Date  . Allergy   . Anemia    hx  . Arthritis   . Diabetes mellitus without complication ([place])    Type II  . Dysrhythmia    Hx: of palpitations in 2010 only  . GERD (gastroesophageal reflux disease)   . Hypertension   . Pancreatic mass    Archie Endo 02/25/2016  . PONV (postoperative nausea and vomiting)    pt reports no PONV after biopsy on 12/24/15  . Right knee DJD   . Stress incontinence   . Urinary frequency   . Urinary urgency   . Wears glasses     Patient Active Problem List   Diagnosis Date Noted  . 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 Shingles 11/14/2015  . Pancreatic mass 11/09/2015  . Leukocytosis 11/09/2015  . Emesis   . Pain of upper abdomen   . Bilateral low back pain without sciatica 10/03/2014  . 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 Diabetes mellitus type 2, controlled, without complications ([place]) 34/74/2595  . Bronchitis 03/19/2011  . General medical examination 11/26/2010  . HTN (hypertension) 11/26/2010  . Depression 11/26/2010  . Colon cancer screening 11/26/2010  . Hyperlipidemia 11/26/2010    Past Surgical History:  Procedure Laterality Date  . CESAREAN SECTION  1975  . COLONOSCOPY W/ BIOPSIES AND POLYPECTOMY     Hx: of in 2012  . DILATION AND CURETTAGE OF UTERUS     Hx: of  1978  . EUS N/A 12/24/2015   Procedure: UPPER ENDOSCOPIC ULTRASOUND (EUS) RADIAL;  Surgeon: Arta Silence, MD;  Location: [facility];  Service: Endoscopy;  Laterality: N/A;  . FINE NEEDLE ASPIRATION N/A 12/24/2015   Procedure: FINE NEEDLE ASPIRATION (FNA) RADIAL;  Surgeon: Arta Silence, MD;  Location: [facility];  Service: Endoscopy;  Laterality: N/A;  . JOINT REPLACEMENT     Hx: of left Knee 2010  . LAPAROSCOPIC DISTAL PANCREATECTOMY  02/25/2016   Diagnostic laparoscopy, laparoscopic hand-assisted   . LAPAROSCOPIC SPLENECTOMY N/A 02/25/2016   Procedure: LAPAROSCOPIC SPLENECTOMY;  Surgeon: Stark Klein, MD;  Location: [facility];  Service: General;  Laterality: N/A;  . PANCREATECTOMY N/A 02/25/2016   Procedure: LAPAROSCOPIC DISTAL PANCREATECTOMY;  Surgeon: Stark Klein, MD;  Location: [facility];  Service: General;  Laterality: N/A;  . SPLENECTOMY  02/25/2016  . TOTAL KNEE ARTHROPLASTY  2010  . TOTAL KNEE ARTHROPLASTY Right 06/26/2012   Dr Noemi Chapel  . TOTAL KNEE ARTHROPLASTY Right 06/26/2012   Procedure: TOTAL KNEE ARTHROPLASTY- right;  Surgeon: Lorn Junes, MD;  Location: [facility];  Service: Orthopedics;  Laterality: Right;    OB History    No data available       Home Medications    Prior to Admission medications   Medication Sig Start Date End Date Taking? Authorizing Provider  acetaminophen (TYLENOL) 500  MG tablet Take 1,000 mg by mouth every 6 (six) hours as needed for mild pain or headache.    [provider]  atorvastatin (LIPITOR) 20 MG tablet TAKE 1 TABLET (20 MG TOTAL) BY MOUTH AT BEDTIME. 12/27/16   Midge Minium, MD  Cholecalciferol (VITAMIN D3) 2000 units TABS Take 1 capsule by mouth daily.    [provider]  Docusate Sodium (COLACE PO) Take 1 capsule by mouth.     [provider]  glipiZIDE (GLUCOTROL) 5 MG tablet Take 1 tablet (5 mg total) by mouth 2 (two) times daily before a meal. 12/14/16   Midge Minium, MD  glucose blood (FREESTYLE  PRECISION NEO TEST) test strip Use as instructed 04/19/16   Midge Minium, MD  Lancets (FREESTYLE) lancets Use one lancet each time sugars are tested, pt tests 2-3 times daily. 04/21/16   Midge Minium, MD  lisinopril (PRINIVIL,ZESTRIL) 10 MG tablet TAKE 1 TABLET (10 MG TOTAL) BY MOUTH DAILY. 07/19/16   Midge Minium, MD  ranitidine (ZANTAC) 150 MG tablet Take 150 mg by mouth daily as needed for heartburn.     [provider]  SitaGLIPtin-MetFORMIN HCl (JANUMET XR) 50-500 MG TB24 Take 1 tablet by mouth 2 (two) times daily. 11/10/16   Elayne Snare, MD    Family History Family History  Problem Relation Age of Onset  . Diabetes Mellitus II Father   . Hypertension Father   . Cancer - Other Mother        Breast    Social History Social History  Substance Use Topics  . Smoking status: Never Smoker  . Smokeless tobacco: Never Used  . Alcohol use No     Allergies   Metformin and related; Darvocet [propoxyphene n-acetaminophen]; Demerol [meperidine]; Hydrochlorothiazide; Percocet [oxycodone-acetaminophen]; and Vicodin [hydrocodone-acetaminophen]   Review of Systems Review of Systems  Constitutional: Negative.   HENT: Negative.  Negative for sore throat and trouble swallowing.        Tongue swelling  Respiratory: Negative.   Cardiovascular: Negative.   Gastrointestinal: Negative.   Musculoskeletal: Negative.   Skin: Negative.   Allergic/Immunologic: Positive for immunocompromised state.       Diabetic   Neurological: Negative.   Psychiatric/Behavioral: Negative.   All other systems reviewed and are negative.    Physical Exam Updated Vital Signs BP (!) 159/56   Pulse 94   Resp 18   Ht 5\' 5"  (1.651 m)   Wt 73.5 kg (162 lb)   SpO2 97%   BMI 26.96 kg/m   Physical Exam  Constitutional: She appears well-developed and well-nourished. No distress.  HENT:  Head: Normocephalic and atraumatic.  Tongue is markedly swollen however not protruding from her  mouth.  Handling secretions well  Eyes: Pupils are equal, round, and reactive to light. Conjunctivae are normal.  Neck: Neck supple. No tracheal deviation present. No thyromegaly present.  Cardiovascular: Normal rate and regular rhythm.   No murmur heard. Pulmonary/Chest: Effort normal and breath sounds normal.  Abdominal: Soft. Bowel sounds are normal. She exhibits no distension. There is no tenderness.  Musculoskeletal: Normal range of motion. She exhibits no edema or tenderness.  Neurological: She is alert. Coordination normal.  Skin: Skin is warm and dry. No rash noted.  Psychiatric: She has a normal mood and affect.  Nursing note and vitals reviewed.    ED Treatments / Results  Labs (all labs ordered are listed, but only abnormal results are displayed) Labs Reviewed - No data to display  EKG  EKG Interpretation None       Radiology No results found.  Procedures Procedures (including critical care time)  Medications Ordered in ED Medications - No data to display   Initial Impression / Assessment and Plan / ED Course  I have reviewed the triage vital signs and the nursing notes.  Pertinent labs & imaging results that were available during my care of the patient were reviewed by me and considered in my medical decision making (see chart for details).     11:35 PM tongue is much less swollen.  It is presently minimally swollen her speech is clear she is handling secretions well and she feels much improved 12:25 a.m. patient's tongue continues to feel improved.  She speaks clearly and is in no distress feels ready for discharge.  Plan stop lisinopril.  Follow-up with Dr.Tabori Final Clinical Impressions(s) / ED Diagnoses  Dx: angioedema Final diagnoses:  None    New Prescriptions New Prescriptions   No medications on file     Orlie Dakin, MD 01/17/17 [phone]

## 2017-01-16 NOTE — ED Triage Notes (Signed)
The pt arrived by gems from home  Her tongue started swelling around 1800  Her las lisinopril was  Last pm  No other problerm with this in thew past.  She is unable to swallow her saliva.  She also cannot talk and she cannot swallow  Ems did iv gave her 0.3 of epine in her lt thigh.    She took benadryl 50 mg at 1900   alertt skin warm and dry

## 2017-01-16 NOTE — ED Notes (Signed)
I called husband who is disabled to tell about her being here for observastion for several hours still

## 2017-01-17 NOTE — Discharge Instructions (Signed)
Stop lisinopril.  That is the likely cause of your tongue swelling tonight.  Call Dr.Tabori tomorrow and ask for an appointment.  You may need to be on a different blood pressure medication.  Return if your tongue feels worse if you develop difficulty breathing or swallowing or feel worse for any reason

## 2017-01-19 ENCOUNTER — Ambulatory Visit (INDEPENDENT_AMBULATORY_CARE_PROVIDER_SITE_OTHER): Payer: Medicare Other | Admitting: Family Medicine

## 2017-01-19 ENCOUNTER — Encounter: Payer: Self-pay | Admitting: Family Medicine

## 2017-01-19 VITALS — BP 130/80 | HR 73 | Temp 98.2°F | Resp 16 | Ht 65.0 in | Wt 162.2 lb

## 2017-01-19 DIAGNOSIS — T783XXD Angioneurotic edema, subsequent encounter: Secondary | ICD-10-CM | POA: Diagnosis not present

## 2017-01-19 DIAGNOSIS — I10 Essential (primary) hypertension: Secondary | ICD-10-CM | POA: Diagnosis not present

## 2017-01-19 DIAGNOSIS — T783XXA Angioneurotic edema, initial encounter: Secondary | ICD-10-CM | POA: Insufficient documentation

## 2017-01-19 NOTE — Patient Instructions (Signed)
Follow up as needed or as scheduled No need for BP meds at this time- yay!!!! Check your pressure 2-3x/week and if consistently higher than 140/90- let me know! Call with any questions or concerns Happy Fall!!!

## 2017-01-19 NOTE — Assessment & Plan Note (Signed)
Chronic problem, well controlled today off all medications.  No need to start a med for the sake of taking a medication as long as BP remains controlled.  Pt will monitor home BP 2-3x/week and let me know if it rises.  Reviewed supportive care and red flags that should prompt return.  Pt expressed understanding and is in agreement w/ plan.

## 2017-01-19 NOTE — Progress Notes (Signed)
   Subjective:    Patient ID: Tracy Williams, female    DOB: 08-23-1945, 71 y.o.   MRN: [id number]  HPI Angioedema- pt developed swelling of neck and tongue on 10/28.  Was seen in ER, dx'd w/ angioedema, and treated.  Her ACE was stopped and pt was advised to remain off all ACES and ARBs.  Pt's BP today is well controlled off all meds.   Review of Systems For ROS see HPI     Objective:   Physical Exam  Constitutional: She is oriented to person, place, and time. She appears well-developed and well-nourished. No distress.  HENT:  Head: Normocephalic and atraumatic.  Eyes: Pupils are equal, round, and reactive to light. Conjunctivae and EOM are normal.  Neck: Normal range of motion. Neck supple. No thyromegaly present.  Cardiovascular: Normal rate, regular rhythm, normal heart sounds and intact distal pulses.   No murmur heard. Pulmonary/Chest: Effort normal and breath sounds normal. No respiratory distress.  Abdominal: Soft. She exhibits no distension. There is no tenderness.  Musculoskeletal: She exhibits no edema.  Lymphadenopathy:    She has no cervical adenopathy.  Neurological: She is alert and oriented to person, place, and time.  Skin: Skin is warm and dry.  Psychiatric: She has a normal mood and affect. Her behavior is normal.  Vitals reviewed.         Assessment & Plan:

## 2017-01-19 NOTE — Assessment & Plan Note (Signed)
Pt developed angioedema from her Lisinopril.  Thankfully sxs have completely resolved but I (along with the ER) have made it very clear that she is not to take another ACE or ARB.  Pt expressed understanding and is in agreement w/ plan.

## 2017-01-24 ENCOUNTER — Other Ambulatory Visit: Payer: Self-pay | Admitting: Pharmacy Technician

## 2017-01-24 NOTE — Patient Outreach (Signed)
[facility]) Care Management  01/24/2017  Tracy Williams November 14, 1945 [id number]  Contacted [facility] to inquire when [name] shipment of Janumet will be delivered. The medication should arrive at the patient's home on Friday.  I followed-up with [name] to notify her of the status, HIPAA identifiers verified and verbal consent received.   PLAN:  I will call the patient to make sure she received her medication.

## 2017-01-25 ENCOUNTER — Other Ambulatory Visit: Payer: Self-pay | Admitting: Pharmacy Technician

## 2017-01-25 NOTE — Patient Outreach (Signed)
[facility]) Care Management  01/25/2017  Tracy Williams 03/30/1945 [id number]   Patient left a voicemail stating that she received her Janumet from [employer] patient assistance today. I will route a message to [place], [place] for case closure.  Doreene Burke, [place] [phone]

## 2017-01-26 ENCOUNTER — Encounter: Payer: Self-pay | Admitting: Family Medicine

## 2017-01-26 ENCOUNTER — Other Ambulatory Visit: Payer: Self-pay | Admitting: Pharmacist

## 2017-01-26 NOTE — Patient Outreach (Signed)
[facility]) Care Management  01/26/2017  Tracy Williams Jun 18, 1945 [id number]  Patient self-referred to [facility] Pharmacist for medication patient assistance.   Per chart review, patient notified [place] Technician she received Janumet from manufacturer patient assistance.   She will need to re-apply for eligibility next calendar year if she remains on medication.   Plan:  Case closed at this time.    Closure letters sent to patient and PCP.    Note routed to Dr Dwyane Dee, to alert him patient was approved through end of calendar year for Janumet patient assistance.   Karrie Meres, PharmD, [place] [phone]

## 2017-03-09 ENCOUNTER — Encounter: Payer: Self-pay | Admitting: Family Medicine

## 2017-03-16 ENCOUNTER — Other Ambulatory Visit: Payer: Medicare Other

## 2017-03-18 ENCOUNTER — Ambulatory Visit: Payer: Medicare Other | Admitting: Endocrinology

## 2017-03-28 ENCOUNTER — Other Ambulatory Visit: Payer: Self-pay

## 2017-03-28 ENCOUNTER — Ambulatory Visit (INDEPENDENT_AMBULATORY_CARE_PROVIDER_SITE_OTHER): Payer: Medicare Other | Admitting: Family Medicine

## 2017-03-28 ENCOUNTER — Encounter: Payer: Self-pay | Admitting: General Practice

## 2017-03-28 ENCOUNTER — Encounter: Payer: Self-pay | Admitting: Family Medicine

## 2017-03-28 VITALS — BP 132/80 | HR 87 | Temp 98.1°F | Resp 16 | Ht 65.0 in | Wt 160.5 lb

## 2017-03-28 DIAGNOSIS — E785 Hyperlipidemia, unspecified: Secondary | ICD-10-CM | POA: Diagnosis not present

## 2017-03-28 DIAGNOSIS — E119 Type 2 diabetes mellitus without complications: Secondary | ICD-10-CM | POA: Diagnosis not present

## 2017-03-28 DIAGNOSIS — F4323 Adjustment disorder with mixed anxiety and depressed mood: Secondary | ICD-10-CM

## 2017-03-28 DIAGNOSIS — I10 Essential (primary) hypertension: Secondary | ICD-10-CM | POA: Diagnosis not present

## 2017-03-28 LAB — HEPATIC FUNCTION PANEL
ALT: 9 U/L (ref 0–35)
AST: 11 U/L (ref 0–37)
Albumin: 4.1 g/dL (ref 3.5–5.2)
Alkaline Phosphatase: 92 U/L (ref 39–117)
BILIRUBIN TOTAL: 0.6 mg/dL (ref 0.2–1.2)
Bilirubin, Direct: 0.1 mg/dL (ref 0.0–0.3)
Total Protein: 6.4 g/dL (ref 6.0–8.3)

## 2017-03-28 LAB — CBC WITH DIFFERENTIAL/PLATELET
BASOS PCT: 1 % (ref 0.0–3.0)
Basophils Absolute: 0.1 10*3/uL (ref 0.0–0.1)
Eosinophils Absolute: 0.4 10*3/uL (ref 0.0–0.7)
Eosinophils Relative: 3.4 % (ref 0.0–5.0)
HEMATOCRIT: 36.8 % (ref 36.0–46.0)
HEMOGLOBIN: 11.6 g/dL — AB (ref 12.0–15.0)
LYMPHS PCT: 13.2 % (ref 12.0–46.0)
Lymphs Abs: 1.6 10*3/uL (ref 0.7–4.0)
MCHC: 31.6 g/dL (ref 30.0–36.0)
MCV: 90.8 fl (ref 78.0–100.0)
MONO ABS: 1 10*3/uL (ref 0.1–1.0)
Monocytes Relative: 8 % (ref 3.0–12.0)
Neutro Abs: 8.9 10*3/uL — ABNORMAL HIGH (ref 1.4–7.7)
Neutrophils Relative %: 74.4 % (ref 43.0–77.0)
Platelets: 378 10*3/uL (ref 150.0–400.0)
RBC: 4.05 Mil/uL (ref 3.87–5.11)
RDW: 14 % (ref 11.5–15.5)
WBC: 11.9 10*3/uL — AB (ref 4.0–10.5)

## 2017-03-28 LAB — LIPID PANEL
CHOLESTEROL: 201 mg/dL — AB (ref 0–200)
HDL: 39.2 mg/dL (ref >39.00)
LDL Cholesterol: 131 mg/dL — ABNORMAL HIGH (ref 0–99)
NonHDL: 161.7
Total CHOL/HDL Ratio: 5
Triglycerides: 155 mg/dL — ABNORMAL HIGH (ref 0.0–149.0)
VLDL: 31 mg/dL (ref 0.0–40.0)

## 2017-03-28 LAB — BASIC METABOLIC PANEL
BUN: 22 mg/dL (ref 6–23)
CALCIUM: 9.3 mg/dL (ref 8.4–10.5)
CO2: 27 meq/L (ref 19–32)
CREATININE: 1.21 mg/dL — AB (ref 0.40–1.20)
Chloride: 105 mEq/L (ref 96–112)
GFR: 46.58 mL/min — ABNORMAL LOW (ref >60.00)
GLUCOSE: 123 mg/dL — AB (ref 70–99)
Potassium: 4.9 mEq/L (ref 3.5–5.1)
SODIUM: 141 meq/L (ref 135–145)

## 2017-03-28 LAB — TSH: TSH: 1.11 u[IU]/mL (ref 0.35–4.50)

## 2017-03-28 MED ORDER — GLIPIZIDE ER 5 MG PO TB24: 5.0000 mg | ORAL_TABLET | Freq: Every day | ORAL | 3 refills | Status: DC

## 2017-03-28 NOTE — Progress Notes (Signed)
   Subjective:    Patient ID: Tracy Williams, female    DOB: [birth year], 72 y.o.   MRN: [id number]  HPI DM- chronic problem.  Pt is on Janumet XR 50/500mg  BID, Glipizide 5mg  (wants to switch back to ER).  UTD on microalbumin, UTD on eye exam, foot exam.  Denies symptomatic lows.  No numbness/tingling of hands/feet.  No CP, SOB, HAs, visual changes, edema.  HTN- chronic problem, pt has been off all BP meds since her angioedema episode in October.  Asymptomatic.  Hyperlipidemia- chronic problem, on Lipitor 20mg  daily.  Denies abd pain, N/V, myalgias.  Grief- husband died Mar 20, 2023.  She has hx of depression- will take a left over 2.5mg  Valium as needed for sleep.  Review of Systems For ROS see HPI     Objective:   Physical Exam  Constitutional: She is oriented to person, place, and time. She appears well-developed and well-nourished. No distress.  HENT:  Head: Normocephalic and atraumatic.  Eyes: Conjunctivae and EOM are normal. Pupils are equal, round, and reactive to light.  Neck: Normal range of motion. Neck supple. No thyromegaly present.  Cardiovascular: Normal rate, regular rhythm, normal heart sounds and intact distal pulses.  No murmur heard. Pulmonary/Chest: Effort normal and breath sounds normal. No respiratory distress.  Abdominal: Soft. She exhibits no distension. There is no tenderness.  Musculoskeletal: She exhibits no edema.  Lymphadenopathy:    She has no cervical adenopathy.  Neurological: She is alert and oriented to person, place, and time.  Skin: Skin is warm and dry.  Psychiatric: She has a normal mood and affect. Her behavior is normal.  Vitals reviewed.         Assessment & Plan:  Adjust disorder- new.  Pt's husband died ~1 month ago.  She is doing fairly well but doesn't want to talk about it today.  Will take left over diazepam 2.5mg  QHS prn.  Encouraged grief counseling.  Will follow.

## 2017-03-28 NOTE — Assessment & Plan Note (Signed)
Chronic problem.  Following w/ Dr Dwyane Dee.  Pt is asking to switch back to Glipizide ER rather than twice daily dosing.  Prescription sent.

## 2017-03-28 NOTE — Assessment & Plan Note (Signed)
Pt's BP was initially elevated today but improved by end of visit.  No need to restart medication at this time.  Reviewed that she has been under a lot of stress and to allow herself time to grieve/adjust.  Will follow.

## 2017-03-28 NOTE — Patient Instructions (Signed)
Follow up in 3 months to recheck blood pressure We'll notify you of your lab results and make any changes if needed I sent the Glipizide prescription to [facility] with any questions or concerns Hang in there!  You can do this!!!

## 2017-03-28 NOTE — Assessment & Plan Note (Signed)
Chronic problem.  Tolerating statin w/o difficulty.  Check labs.  Adjust meds prn  

## 2017-04-11 ENCOUNTER — Other Ambulatory Visit (INDEPENDENT_AMBULATORY_CARE_PROVIDER_SITE_OTHER): Payer: Medicare Other

## 2017-04-11 DIAGNOSIS — E1165 Type 2 diabetes mellitus with hyperglycemia: Secondary | ICD-10-CM

## 2017-04-11 LAB — HEMOGLOBIN A1C: HEMOGLOBIN A1C: 6.7 % — AB (ref 4.6–6.5)

## 2017-04-15 ENCOUNTER — Ambulatory Visit (INDEPENDENT_AMBULATORY_CARE_PROVIDER_SITE_OTHER): Payer: Medicare Other | Admitting: Endocrinology

## 2017-04-15 ENCOUNTER — Encounter: Payer: Self-pay | Admitting: Endocrinology

## 2017-04-15 VITALS — BP 136/72 | HR 78 | Ht 65.0 in | Wt 159.0 lb

## 2017-04-15 DIAGNOSIS — E782 Mixed hyperlipidemia: Secondary | ICD-10-CM

## 2017-04-15 DIAGNOSIS — E1165 Type 2 diabetes mellitus with hyperglycemia: Secondary | ICD-10-CM

## 2017-04-15 NOTE — Patient Instructions (Addendum)
Check blood sugars on waking up  2/7  Also check blood sugars about 2 hours after a meal and do this after different meals by rotation  Recommended blood sugar levels on waking up is 90-130 and about 2 hours after meal is 130-180  Please bring your blood sugar monitor to each visit, thank you  Take 2 Lipitor daily

## 2017-04-15 NOTE — Progress Notes (Signed)
Patient ID: Tracy Williams, female   DOB: March 19, 1946, 72 y.o.   MRN: [id number]          Reason for Appointment:  Follow-up for Type 2 Diabetes  Referring physician: Birdie Riddle   History of Present Illness:          Date of diagnosis of type 2 diabetes mellitus: ?  2012       Background history:   Her blood sugars were probably mildly increased at diagnosis and she does not remember her initial readings She was apparently tried on metformin according to her records initially However she could not take it for long because of nausea and lightheadedness Subsequently she has been on Januvia mostly and also glipizide added in 2017 Her A1c has fluctuated and has been as high as10.2 in 07/2015 Although a prescription was made for Jardiance in 9/17 the patient does not recall taking this  Recent history:   Her A1c has been consistently over 8% previously but now 6.7  Non-insulin hypoglycemic drugs the patient is taking are: Janumet XR 50/500, mg daily, glipizide ER 5 mg daily  Current management, blood sugar patterns and problems identified:  She has been able to continue taking Janumet XR, previously had difficulty affording brand name medications  With this her blood sugars are excellent although she checking very sporadically at home and mostly before noon  She is also continuing to improve her diet and is losing little weight again  She usually following the diet given by the dietitian  She does forget to check her readings after meals, has a couple of readings in the afternoons with her fairly good       Side effects from medications have been:nausea from metformin  Compliance with the medical regimen: fair  Glucose monitoring:  done 1 times a day         Glucometer: One Touch.      Blood Glucose readings by monitor download  Mean values apply above for all meters except median for One Touch  PRE-MEAL Fasting Lunch Dinner Bedtime Overall  Glucose range: 100-127         Mean/median: 114     123   POST-MEAL PC Breakfast PC Lunch PC Dinner  Glucose range:  100, 158    Mean/median:        Self-care: The diet that the patient has been following is: tries to limit fats       Typical meal intake: Breakfast is either toast and peanut butter or bacon and eggs Lunch usually [place] sandwich Dinner either hamburger or chicken with salad and bread For snacks she  has low fat items        Avoiding drinks with sugar       Dietician visit, most recent: 8/18               Exercise: some indoor walking  Weight history:  Wt Readings from Last 3 Encounters:  04/15/17 159 lb (72.1 kg)  03/28/17 160 lb 8 oz (72.8 kg)  01/19/17 162 lb 4 oz (73.6 kg)    Glycemic control:   Lab Results  Component Value Date   HGBA1C 6.7 (H) 04/11/2017   HGBA1C 8.3 (H) 12/02/2016   HGBA1C 8.2 11/10/2016   Lab Results  Component Value Date   MICROALBUR <0.7 12/20/2016   LDLCALC 131 (H) 03/28/2017   CREATININE 1.21 (H) 03/28/2017   Lab Results  Component Value Date   MICRALBCREAT 0.6 12/20/2016    Lab Results  Component Value Date   FRUCTOSAMINE 232 12/20/2016      Allergies as of 04/15/2017      Reactions   Lisinopril Swelling   Caused throat and tongue to swell   Metformin And Related Nausea And Vomiting   Darvocet [propoxyphene N-acetaminophen] Nausea And Vomiting   Demerol [meperidine] Nausea And Vomiting   Hydrochlorothiazide Nausea And Vomiting   Percocet [oxycodone-acetaminophen] Nausea And Vomiting   Vicodin [hydrocodone-acetaminophen] Nausea And Vomiting      Medication List        Accurate as of 04/15/17  3:10 PM. Always use your most recent med list.          acetaminophen 500 MG tablet Commonly known as:  TYLENOL Take 1,000 mg by mouth every 6 (six) hours as needed for mild pain or headache.   atorvastatin 20 MG tablet Commonly known as:  LIPITOR TAKE 1 TABLET (20 MG TOTAL) BY MOUTH AT BEDTIME.   COLACE PO Take 1 capsule by  mouth.   freestyle lancets Use one lancet each time sugars are tested, pt tests 2-3 times daily.   glipiZIDE 5 MG 24 hr tablet Commonly known as:  GLUCOTROL XL Take 1 tablet (5 mg total) by mouth daily with breakfast.   glucose blood test strip Commonly known as:  FREESTYLE PRECISION NEO TEST Use as instructed   ranitidine 150 MG tablet Commonly known as:  ZANTAC Take 150 mg by mouth daily as needed for heartburn.   SitaGLIPtin-MetFORMIN HCl 50-500 MG Tb24 Commonly known as:  JANUMET XR Take 1 tablet by mouth 2 (two) times daily.   Vitamin D3 2000 units Tabs Take 1 capsule by mouth daily.       Allergies:  Allergies  Allergen Reactions  . Lisinopril Swelling    Caused throat and tongue to swell  . Metformin And Related Nausea And Vomiting  . Darvocet [Propoxyphene N-Acetaminophen] Nausea And Vomiting  . Demerol [Meperidine] Nausea And Vomiting  . Hydrochlorothiazide Nausea And Vomiting  . Percocet [Oxycodone-Acetaminophen] Nausea And Vomiting  . Vicodin [Hydrocodone-Acetaminophen] Nausea And Vomiting    Past Medical History:  Diagnosis Date  . Allergy   . Anemia    hx  . Arthritis   . Diabetes mellitus without complication ([place])    Type II  . Dysrhythmia    Hx: of palpitations in 2010 only  . GERD (gastroesophageal reflux disease)   . Hypertension   . Pancreatic mass    [facility] Endo 02/25/2016  . PONV (postoperative nausea and vomiting)    pt reports no PONV after biopsy on 12/24/15  . Right knee DJD   . Stress incontinence   . Urinary frequency   . Urinary urgency   . Wears glasses     Past Surgical History:  Procedure Laterality Date  . CESAREAN SECTION  1975  . COLONOSCOPY W/ BIOPSIES AND POLYPECTOMY     Hx: of in 2012  . DILATION AND CURETTAGE OF UTERUS     Hx: of 1978  . EUS N/A 12/24/2015   Procedure: UPPER ENDOSCOPIC ULTRASOUND (EUS) RADIAL;  Surgeon: Arta Silence, MD;  Location: [facility];  Service: Endoscopy;  Laterality: N/A;  . FINE  NEEDLE ASPIRATION N/A 12/24/2015   Procedure: FINE NEEDLE ASPIRATION (FNA) RADIAL;  Surgeon: Arta Silence, MD;  Location: [facility];  Service: Endoscopy;  Laterality: N/A;  . JOINT REPLACEMENT     Hx: of left Knee 2010  . LAPAROSCOPIC DISTAL PANCREATECTOMY  02/25/2016   Diagnostic laparoscopy, laparoscopic hand-assisted   . LAPAROSCOPIC  SPLENECTOMY N/A 02/25/2016   Procedure: LAPAROSCOPIC SPLENECTOMY;  Surgeon: Stark Klein, MD;  Location: [place];  Service: General;  Laterality: N/A;  . PANCREATECTOMY N/A 02/25/2016   Procedure: LAPAROSCOPIC DISTAL PANCREATECTOMY;  Surgeon: Stark Klein, MD;  Location: [place];  Service: General;  Laterality: N/A;  . SPLENECTOMY  02/25/2016  . TOTAL KNEE ARTHROPLASTY  2010  . TOTAL KNEE ARTHROPLASTY Right 06/26/2012   Dr Noemi Chapel  . TOTAL KNEE ARTHROPLASTY Right 06/26/2012   Procedure: TOTAL KNEE ARTHROPLASTY- right;  Surgeon: Lorn Junes, MD;  Location: [facility];  Service: Orthopedics;  Laterality: Right;    Family History  Problem Relation Age of Onset  . Diabetes Mellitus II Father   . Hypertension Father   . Cancer - Other Mother        Breast    Social History:  reports that  has never smoked. she has never used smokeless tobacco. She reports that she does not drink alcohol or use drugs.   Review of Systems   Lipid history: Lipitor 20mg  with the following results, has been followed by PCP   LDL appears to be back over 100 compared to 12/2016 HDL improved  Lab Results  Component Value Date   CHOL 201 (H) 03/28/2017   CHOL 216 (H) 08/12/2016   CHOL 278 (H) 12/15/2015   Lab Results  Component Value Date   HDL 39.20 03/28/2017   HDL 37.30 (L) 08/12/2016   HDL 37.80 (L) 12/15/2015   Lab Results  Component Value Date   LDLCALC 131 (H) 03/28/2017   LDLCALC 144 (H) 08/12/2016   LDLCALC 116 (H) 04/14/2015   Lab Results  Component Value Date   TRIG 155.0 (H) 03/28/2017   TRIG 172.0 (H) 08/12/2016   TRIG 210.0 (H) 12/15/2015   Lab  Results  Component Value Date   CHOLHDL 5 03/28/2017   CHOLHDL 6 08/12/2016   CHOLHDL 7 12/15/2015   Lab Results  Component Value Date   LDLDIRECT 98.0 12/20/2016   LDLDIRECT 188.0 12/15/2015   LDLDIRECT 192.4 06/09/2012            Hypertension:treated adequately with lisinopril  Most recent eye exam was 08/2016  Most recent foot exam:8/18    LABS:  Lab on 04/11/2017  Component Date Value Ref Range Status  . Hgb A1c MFr Bld 04/11/2017 6.7* 4.6 - 6.5 % Final   Glycemic Control Guidelines for People with Diabetes:Non Diabetic:  <6%Goal of Therapy: <7%Additional Action Suggested:  >8%     Physical Examination:  BP 136/72   Pulse 78   Ht 5\' 5"  (1.651 m)   Wt 159 lb (72.1 kg)   SpO2 97%   BMI 26.46 kg/m       ASSESSMENT:  Diabetes type 2, non-insulin requiring   See history of present illness for  discussion of current diabetes management, blood sugar patterns and problems identified  Her A1c is now 6.7%  Her blood sugars have been improved significantly with using Janumet instead of metformin and improving diet She has lost some weight also  She has fairly good blood sugars at home without hypoglycemia using her glipizide ER Currently however not checking blood sugars after meals which have been somewhat variable in the past  HYPERLIPIDEMIA: LDL is significantly above 100 now  With her history of diabetes, hypertension, hyperlipidemia and family history of coronary disease she needs high intensity statin  Mild hypertension: well-controlled   PLAN:    She will continue her regimen unchanged including Janumet XR.  Discussed checking blood sugars more after meals and less in the morning  Continue to increase activity level with some walking  She needs to be on at least 40 mg of Lipitor and possibly switch to Crestor for more effective lipid-lowering and cardiovascular a reduction  Will recheck her lipids on next visit      There are no Patient  Instructions on file for this visit.    Elayne Snare 04/15/2017, 3:10 PM   Note: This office note was prepared with Dragon voice recognition system technology. Any transcriptional errors that result from this process are unintentional.

## 2017-04-18 ENCOUNTER — Encounter: Payer: Self-pay | Admitting: Family Medicine

## 2017-04-25 ENCOUNTER — Encounter: Payer: Self-pay | Admitting: Family Medicine

## 2017-04-25 NOTE — Telephone Encounter (Signed)
Spoke with patient regarding fall. Patient reports "passing out" on Saturday (04/23/17) around 6 pm. Patient woke up in a sitting position with a cracked tooth and black eye, stating she must have hit her face on the counter/towel rack. She states she had a very busy week and has had head congestion x 2-3 days. Denies blurred vision, headache, slurred speech or confusion. The only medication change recently is an increase in Atorvastatin. Please advise.

## 2017-04-26 ENCOUNTER — Ambulatory Visit (INDEPENDENT_AMBULATORY_CARE_PROVIDER_SITE_OTHER): Payer: Medicare Other | Admitting: Family Medicine

## 2017-04-26 ENCOUNTER — Encounter: Payer: Self-pay | Admitting: Family Medicine

## 2017-04-26 ENCOUNTER — Other Ambulatory Visit: Payer: Self-pay

## 2017-04-26 VITALS — BP 122/68 | HR 76 | Temp 98.1°F | Resp 17 | Ht 65.0 in | Wt 157.1 lb

## 2017-04-26 DIAGNOSIS — R55 Syncope and collapse: Secondary | ICD-10-CM

## 2017-04-26 DIAGNOSIS — H05232 Hemorrhage of left orbit: Secondary | ICD-10-CM

## 2017-04-26 DIAGNOSIS — E119 Type 2 diabetes mellitus without complications: Secondary | ICD-10-CM | POA: Diagnosis not present

## 2017-04-26 LAB — CBC WITH DIFFERENTIAL/PLATELET
BASOS ABS: 0.1 10*3/uL (ref 0.0–0.1)
Basophils Relative: 0.6 % (ref 0.0–3.0)
Eosinophils Absolute: 0.4 10*3/uL (ref 0.0–0.7)
Eosinophils Relative: 3.6 % (ref 0.0–5.0)
HCT: 37.5 % (ref 36.0–46.0)
Hemoglobin: 12.4 g/dL (ref 12.0–15.0)
Lymphocytes Relative: 15.4 % (ref 12.0–46.0)
Lymphs Abs: 1.6 10*3/uL (ref 0.7–4.0)
MCHC: 32.9 g/dL (ref 30.0–36.0)
MCV: 89.1 fl (ref 78.0–100.0)
MONO ABS: 0.9 10*3/uL (ref 0.1–1.0)
Monocytes Relative: 8.9 % (ref 3.0–12.0)
Neutro Abs: 7.5 10*3/uL (ref 1.4–7.7)
Neutrophils Relative %: 71.5 % (ref 43.0–77.0)
Platelets: 387 10*3/uL (ref 150.0–400.0)
RBC: 4.21 Mil/uL (ref 3.87–5.11)
RDW: 13.7 % (ref 11.5–15.5)
WBC: 10.4 10*3/uL (ref 4.0–10.5)

## 2017-04-26 LAB — BASIC METABOLIC PANEL
BUN: 29 mg/dL — AB (ref 6–23)
CALCIUM: 9.1 mg/dL (ref 8.4–10.5)
CO2: 26 mEq/L (ref 19–32)
CREATININE: 1.3 mg/dL — AB (ref 0.40–1.20)
Chloride: 101 mEq/L (ref 96–112)
GFR: 42.87 mL/min — AB (ref >60.00)
Glucose, Bld: 145 mg/dL — ABNORMAL HIGH (ref 70–99)
Potassium: 4.3 mEq/L (ref 3.5–5.1)
Sodium: 139 mEq/L (ref 135–145)

## 2017-04-26 LAB — TSH: TSH: 1.08 u[IU]/mL (ref 0.35–4.50)

## 2017-04-26 NOTE — Progress Notes (Signed)
   Subjective:    Patient ID: Tracy Williams, female    DOB: 11-01-1945, 72 y.o.   MRN: [id number]  HPI Syncope- pt had a witnessed syncopal event on Saturday.  Pt reports that prior to passing out she had no other symptoms- no dizziness, no nausea, no sweating, no flushing.  'I didn't even know I passed out'.  Pt has marked bruising and swelling of L eye.  Also chipped front tooth.  Afterwards, pt felt shaky but denies confusion.  There was no witnessed seizure like activity.  No headache afterwards.  CBG yesterday was 94.  Did not check sugar after episode.  Pt reports that day she was out and about more than usual and she was sitting.  Did not get up any more quickly than usual- walked across kitchen and was on her way back when it occurred.  No HAs, CP, SOB- 'I feel fine.  Maybe a little shaky'.   Review of Systems For ROS see HPI     Objective:   Physical Exam  Constitutional: She is oriented to person, place, and time. She appears well-developed and well-nourished. No distress.  HENT:  Head: Normocephalic.  L periorbital hematoma.  No bony abnormality palpable on exam  Eyes: Conjunctivae and EOM are normal. Pupils are equal, round, and reactive to light.  Neck: Normal range of motion. Neck supple. No thyromegaly present.  Cardiovascular: Normal rate, regular rhythm, normal heart sounds and intact distal pulses.  No murmur heard. Pulmonary/Chest: Effort normal and breath sounds normal. No respiratory distress.  Abdominal: Soft. She exhibits no distension. There is no tenderness.  Musculoskeletal: She exhibits no edema.  Lymphadenopathy:    She has no cervical adenopathy.  Neurological: She is alert and oriented to person, place, and time.  Skin: Skin is warm and dry.  Psychiatric: She has a normal mood and affect. Her behavior is normal.  Vitals reviewed.         Assessment & Plan:  Syncope- new.  Pt had a witnessed syncopal event that doesn't sound consistent w/ seizure  (no seizure like activity and no postictal state), hypoglycemia (she had no prodromal symptoms), vasovagal episode (she had not just changed positions, she had already walked across the kitchen, performed a task and was on her way back).  This is concerning for possible arrhythmia.  EKG WNL.  She is currently asymptomatic w/ exception of chipped tooth and L periorbital hematoma.  She has tenderness to the supraorbital prominence/ridge but no bony abnormality or step-off.  Pt refusing xray or CT scan today to assess for facial fx.  Check labs (CBC, BMP, TSH) to assess for anemia, electrolyte disturbance, or thyroid abnormality.  Refer back to Dr Tamala Julian to assess for evaluation of arrhythmia.  If symptoms recur, I stressed need for ER evaluation.  At that time, would require neuro eval.  Pt expressed understanding and is in agreement w/ plan.

## 2017-04-26 NOTE — Patient Instructions (Signed)
Follow up as needed or as scheduled We'll notify you of your lab results and make any changes if needed We'll call you with your cardiology appt Make sure you are drinking plenty of water Change positions slowly! Make sure you are eating regularly If you have another episode, you MUST go to the ER No med changes at this time Call with any questions or concerns Hang in there!!

## 2017-04-27 ENCOUNTER — Other Ambulatory Visit: Payer: Self-pay | Admitting: Family Medicine

## 2017-04-27 DIAGNOSIS — R7989 Other specified abnormal findings of blood chemistry: Secondary | ICD-10-CM

## 2017-04-30 ENCOUNTER — Other Ambulatory Visit: Payer: Self-pay | Admitting: Family Medicine

## 2017-05-04 ENCOUNTER — Other Ambulatory Visit (INDEPENDENT_AMBULATORY_CARE_PROVIDER_SITE_OTHER): Payer: Medicare Other

## 2017-05-04 DIAGNOSIS — R7989 Other specified abnormal findings of blood chemistry: Secondary | ICD-10-CM

## 2017-05-04 LAB — BASIC METABOLIC PANEL WITH GFR
BUN: 25 mg/dL — ABNORMAL HIGH (ref 6–23)
CO2: 29 meq/L (ref 19–32)
Calcium: 9.3 mg/dL (ref 8.4–10.5)
Chloride: 100 meq/L (ref 96–112)
Creatinine, Ser: 1.34 mg/dL — ABNORMAL HIGH (ref 0.40–1.20)
GFR: 41.39 mL/min — ABNORMAL LOW
Glucose, Bld: 173 mg/dL — ABNORMAL HIGH (ref 70–99)
Potassium: 4.6 meq/L (ref 3.5–5.1)
Sodium: 138 meq/L (ref 135–145)

## 2017-05-05 ENCOUNTER — Other Ambulatory Visit: Payer: Self-pay | Admitting: Physician Assistant

## 2017-05-05 DIAGNOSIS — E119 Type 2 diabetes mellitus without complications: Secondary | ICD-10-CM

## 2017-05-05 DIAGNOSIS — R7989 Other specified abnormal findings of blood chemistry: Secondary | ICD-10-CM

## 2017-05-05 DIAGNOSIS — N189 Chronic kidney disease, unspecified: Secondary | ICD-10-CM

## 2017-05-26 ENCOUNTER — Telehealth: Payer: Self-pay | Admitting: Endocrinology

## 2017-05-26 NOTE — Telephone Encounter (Signed)
Patient has a lab appt here on 07/12/17. Patient is getting her labs done at Dr. Birdie Riddle (PCP) on 07/07/17. Patient would like to know if she can have Dr. Birdie Riddle send lab results to [place] before patient's appt with Dr. Dwyane Dee 07/14/17 and cancel her lab appt done here on 07/12/17. Please call patient at ph# [phone] to advise.

## 2017-05-26 NOTE — Telephone Encounter (Signed)
Is this ok please advise

## 2017-05-26 NOTE — Telephone Encounter (Signed)
Yes

## 2017-05-26 NOTE — Telephone Encounter (Signed)
Pt informed of below. 07/12/17 lab appt cancelled.

## 2017-06-06 ENCOUNTER — Encounter: Payer: Self-pay | Admitting: Family Medicine

## 2017-06-06 ENCOUNTER — Telehealth: Payer: Self-pay | Admitting: *Deleted

## 2017-06-06 MED ORDER — ATORVASTATIN CALCIUM 40 MG PO TABS: 40.0000 mg | ORAL_TABLET | Freq: Every day | ORAL | 3 refills | Status: DC

## 2017-06-06 NOTE — Telephone Encounter (Signed)
Done- see meds   Instead of 20 mg twice a day she will take Lipitor 40 mg daily , please send  ----- Message -----  From: Davis Gourd, CMA  Sent: 06/06/2017 11:37 AM  To: Elayne Snare, MD  Subject: Melton Alar: Non-Urgent Medical Question               ----- Message -----  From: Eileen Stanford  Sent: 06/06/2017 10:55 AM  To: [id number] Clinical [facility]  Subject: Non-Urgent Medical Question             ----- Message from [place], Generic sent at 06/06/2017 10:55 AM EDT -----    Good Morning !    Since Dr. Dwyane Dee decided that I needed atorvastatin twice a day, I have finished my prescription and need a new one that indicates I take it 2x a day. It is the 20 MG tablet. Please send it to [facility] on [address] [phone].    Thank you very much.

## 2017-06-27 ENCOUNTER — Ambulatory Visit: Payer: Medicare Other | Admitting: Family Medicine

## 2017-07-07 ENCOUNTER — Ambulatory Visit (INDEPENDENT_AMBULATORY_CARE_PROVIDER_SITE_OTHER): Payer: Medicare Other | Admitting: Family Medicine

## 2017-07-07 ENCOUNTER — Encounter: Payer: Self-pay | Admitting: Family Medicine

## 2017-07-07 ENCOUNTER — Other Ambulatory Visit: Payer: Self-pay

## 2017-07-07 VITALS — BP 124/72 | HR 75 | Temp 98.2°F | Resp 16 | Ht 65.0 in | Wt 156.4 lb

## 2017-07-07 DIAGNOSIS — I10 Essential (primary) hypertension: Secondary | ICD-10-CM | POA: Diagnosis not present

## 2017-07-07 DIAGNOSIS — E119 Type 2 diabetes mellitus without complications: Secondary | ICD-10-CM | POA: Diagnosis not present

## 2017-07-07 DIAGNOSIS — E785 Hyperlipidemia, unspecified: Secondary | ICD-10-CM | POA: Diagnosis not present

## 2017-07-07 DIAGNOSIS — F329 Major depressive disorder, single episode, unspecified: Secondary | ICD-10-CM

## 2017-07-07 LAB — LIPID PANEL
CHOL/HDL RATIO: 5
Cholesterol: 174 mg/dL (ref 0–200)
HDL: 35.5 mg/dL — ABNORMAL LOW (ref >39.00)
LDL CALC: 101 mg/dL — AB (ref 0–99)
NONHDL: 138.96
Triglycerides: 192 mg/dL — ABNORMAL HIGH (ref 0.0–149.0)
VLDL: 38.4 mg/dL (ref 0.0–40.0)

## 2017-07-07 LAB — HEPATIC FUNCTION PANEL
ALT: 13 U/L (ref 0–35)
AST: 16 U/L (ref 0–37)
Albumin: 4.1 g/dL (ref 3.5–5.2)
Alkaline Phosphatase: 95 U/L (ref 39–117)
BILIRUBIN DIRECT: 0.1 mg/dL (ref 0.0–0.3)
BILIRUBIN TOTAL: 0.5 mg/dL (ref 0.2–1.2)
Total Protein: 6.5 g/dL (ref 6.0–8.3)

## 2017-07-07 LAB — BASIC METABOLIC PANEL
BUN: 26 mg/dL — AB (ref 6–23)
CALCIUM: 9.5 mg/dL (ref 8.4–10.5)
CO2: 29 mEq/L (ref 19–32)
CREATININE: 1.21 mg/dL — AB (ref 0.40–1.20)
Chloride: 102 mEq/L (ref 96–112)
GFR: 46.54 mL/min — ABNORMAL LOW (ref >60.00)
GLUCOSE: 96 mg/dL (ref 70–99)
Potassium: 5.2 mEq/L — ABNORMAL HIGH (ref 3.5–5.1)
Sodium: 140 mEq/L (ref 135–145)

## 2017-07-07 LAB — HEMOGLOBIN A1C: Hgb A1c MFr Bld: 6.8 % — ABNORMAL HIGH (ref 4.6–6.5)

## 2017-07-07 NOTE — Progress Notes (Signed)
   Subjective:    Patient ID: Tracy Williams, female    DOB: [birth year], 72 y.o.   MRN: [id number]  HPI HTN- chronic problem, currently off all medication since she had an episode of angioedema.  BP is well controlled.  Pt reports feeling well- no CP, SOB, HAs, visual changes, edema.  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  Able to go to [place] to visit family, a week at [employer] w/ friends.  Is not interested in medication.  Hyperlipidemia- pt's statin was increased to Lipitor 40mg  daily.  No abd pain, N/V.  Review of Systems For ROS see HPI     Objective:   Physical Exam  Constitutional: She is oriented to person, place, and time. She appears well-developed and well-nourished. No distress.  HENT:  Head: Normocephalic and atraumatic.  Eyes: Pupils are equal, round, and reactive to light. Conjunctivae and EOM are normal.  Neck: Normal range of motion. Neck supple. No thyromegaly present.  Cardiovascular: Normal rate, regular rhythm, normal heart sounds and intact distal pulses.  No murmur heard. Pulmonary/Chest: Effort normal and breath sounds normal. No respiratory distress.  Abdominal: Soft. She exhibits no distension. There is no tenderness.  Musculoskeletal: She exhibits no edema.  Lymphadenopathy:    She has no cervical adenopathy.  Neurological: She is alert and oriented to person, place, and time.  Skin: Skin is warm and dry.  Psychiatric: She has a normal mood and affect. Her behavior is normal.  Vitals reviewed.         Assessment & Plan:

## 2017-07-07 NOTE — Assessment & Plan Note (Signed)
Has appt upcoming w/ Dr Dwyane Dee.  Asking for labs today.

## 2017-07-07 NOTE — Assessment & Plan Note (Signed)
Chronic problem.  Well controlled w/o medication at this time.  Since she is asymptomatic, we will not start any medication and continue to follow.  Pt expressed understanding and is in agreement w/ plan.

## 2017-07-07 NOTE — Patient Instructions (Addendum)
Follow up in October to recheck BP and cholesterol We'll notify you of your lab results and make any changes if needed No meds at this time!!! Keep up the good work on healthy diet and regular exercise- you and your BP look great!! Call with any questions or concerns Happy [place]!!!

## 2017-07-07 NOTE — Assessment & Plan Note (Signed)
Much improved.  Pt is handling her husband's death much w/ incredible poise and grace.  Applauded her for getting out and participating in life.  Will continue to follow.

## 2017-07-07 NOTE — Assessment & Plan Note (Signed)
Chronic problem.  Statin was increased to Lipitor 40mg  daily.  She sees Dr Dwyane Dee next week and we wanted to repeat a lipid panel- pt would prefer to do today.  Will forward today's note and labs to Dr Dwyane Dee.

## 2017-07-12 ENCOUNTER — Other Ambulatory Visit: Payer: Medicare Other

## 2017-07-14 ENCOUNTER — Ambulatory Visit: Payer: Medicare Other | Admitting: Endocrinology

## 2017-07-14 ENCOUNTER — Encounter: Payer: Self-pay | Admitting: Endocrinology

## 2017-07-14 ENCOUNTER — Ambulatory Visit (INDEPENDENT_AMBULATORY_CARE_PROVIDER_SITE_OTHER): Payer: Medicare Other | Admitting: Endocrinology

## 2017-07-14 VITALS — BP 142/60 | HR 85 | Ht 65.0 in | Wt 156.8 lb

## 2017-07-14 DIAGNOSIS — E1165 Type 2 diabetes mellitus with hyperglycemia: Secondary | ICD-10-CM | POA: Diagnosis not present

## 2017-07-14 DIAGNOSIS — E875 Hyperkalemia: Secondary | ICD-10-CM

## 2017-07-14 LAB — COMPREHENSIVE METABOLIC PANEL
ALK PHOS: 92 U/L (ref 39–117)
ALT: 11 U/L (ref 0–35)
AST: 14 U/L (ref 0–37)
Albumin: 4 g/dL (ref 3.5–5.2)
BUN: 27 mg/dL — ABNORMAL HIGH (ref 6–23)
CO2: 29 meq/L (ref 19–32)
Calcium: 9.1 mg/dL (ref 8.4–10.5)
Chloride: 104 mEq/L (ref 96–112)
Creatinine, Ser: 1.28 mg/dL — ABNORMAL HIGH (ref 0.40–1.20)
GFR: 43.62 mL/min — AB (ref >60.00)
GLUCOSE: 153 mg/dL — AB (ref 70–99)
Potassium: 4.1 mEq/L (ref 3.5–5.1)
Sodium: 140 mEq/L (ref 135–145)
Total Bilirubin: 0.5 mg/dL (ref 0.2–1.2)
Total Protein: 6.8 g/dL (ref 6.0–8.3)

## 2017-07-14 NOTE — Progress Notes (Signed)
Patient ID: Tracy Williams, female   DOB: 1945-12-23, 72 y.o.   MRN: [id number]          Reason for Appointment:  Follow-up for Type 2 Diabetes  Referring physician: Birdie Riddle   History of Present Illness:          Date of diagnosis of type 2 diabetes mellitus: ?  2012       Background history:   Her blood sugars were probably mildly increased at diagnosis and she does not remember her initial readings She was apparently tried on metformin according to her records initially However she could not take it for long because of nausea and lightheadedness Subsequently she has been on Januvia mostly and also glipizide added in 2017 Her A1c has fluctuated and has been as high as10.2 in 07/2015 Although a prescription was made for Jardiance in 9/17 the patient does not recall taking this  Recent history:   Her A1c has been consistently over 8% prior to her consultation but now stable, 6.8 compared to previous level of 6.7  Non-insulin hypoglycemic drugs the patient is taking are: Janumet XR 50/500, mg daily, glipizide ER 5 mg daily  Current management, blood sugar patterns and problems identified:  She has not been regular at all with checking her blood sugar at home and only did a couple of readings 2 weeks ago  However her blood sugars are still excellent and lab glucose was also fairly good  She did have a glucose of 232 after evening meal and she does not know what caused this  No side effects with Janumet XR  She has recently started doing a little walking or other physical activities but no formal exercise  Her weight is about the same  She usually following the diet given by the dietitian  No hypoglycemia with taking glipizide       Side effects from medications have been:nausea from metformin  Compliance with the medical regimen: fair  Glucose monitoring:  done 1 times a day         Glucometer:  Freestyle       Blood Glucose readings by monitor download She has  readings only on 3 days ranging from 85 up to 232 with only one high reading   Self-care: The diet that the patient has been following is: tries to limit fats       Typical meal intake: Breakfast is either toast and peanut butter or bacon and eggs Lunch usually [place] sandwich Dinner either hamburger or chicken with salad and bread For snacks she  has low fat items        Avoiding drinks with sugar       Dietician visit, most recent: 8/18               Exercise: some walking  Weight history:  Wt Readings from Last 3 Encounters:  07/14/17 156 lb 12.8 oz (71.1 kg)  07/07/17 156 lb 6 oz (70.9 kg)  04/26/17 157 lb 2 oz (71.3 kg)    Glycemic control:   Lab Results  Component Value Date   HGBA1C 6.8 (H) 07/07/2017   HGBA1C 6.7 (H) 04/11/2017   HGBA1C 8.3 (H) 12/02/2016   Lab Results  Component Value Date   MICROALBUR <0.7 12/20/2016   LDLCALC 101 (H) 07/07/2017   CREATININE 1.28 (H) 07/14/2017   Lab Results  Component Value Date   MICRALBCREAT 0.6 12/20/2016    Lab Results  Component Value Date   FRUCTOSAMINE 232  12/20/2016      Allergies as of 07/14/2017      Reactions   Lisinopril Swelling   Caused throat and tongue to swell   Metformin And Related Nausea And Vomiting   Darvocet [propoxyphene N-acetaminophen] Nausea And Vomiting   Demerol [meperidine] Nausea And Vomiting   Hydrochlorothiazide Nausea And Vomiting   Percocet [oxycodone-acetaminophen] Nausea And Vomiting   Vicodin [hydrocodone-acetaminophen] Nausea And Vomiting      Medication List        Accurate as of 07/14/17 11:59 PM. Always use your most recent med list.          acetaminophen 500 MG tablet Commonly known as:  TYLENOL Take 1,000 mg by mouth every 6 (six) hours as needed for mild pain or headache.   atorvastatin 40 MG tablet Commonly known as:  LIPITOR Take 1 tablet (40 mg total) by mouth daily.   BIOTIN PO Take by mouth.   COLACE PO Take 1 capsule by mouth.   freestyle  lancets Use one lancet each time sugars are tested, pt tests 2-3 times daily.   glipiZIDE 5 MG 24 hr tablet Commonly known as:  GLUCOTROL XL TAKE 1 TABLET (5 MG TOTAL) BY MOUTH DAILY WITH BREAKFAST.   glucose blood test strip Commonly known as:  FREESTYLE PRECISION NEO TEST Use as instructed   ranitidine 150 MG tablet Commonly known as:  ZANTAC Take 150 mg by mouth daily as needed for heartburn.   SitaGLIPtin-MetFORMIN HCl 50-500 MG Tb24 Commonly known as:  JANUMET XR Take 1 tablet by mouth 2 (two) times daily.   Vitamin D3 2000 units Tabs Take 1 capsule by mouth daily.       Allergies:  Allergies  Allergen Reactions  . Lisinopril Swelling    Caused throat and tongue to swell  . Metformin And Related Nausea And Vomiting  . Darvocet [Propoxyphene N-Acetaminophen] Nausea And Vomiting  . Demerol [Meperidine] Nausea And Vomiting  . Hydrochlorothiazide Nausea And Vomiting  . Percocet [Oxycodone-Acetaminophen] Nausea And Vomiting  . Vicodin [Hydrocodone-Acetaminophen] Nausea And Vomiting    Past Medical History:  Diagnosis Date  . Allergy   . Anemia    hx  . Arthritis   . Diabetes mellitus without complication ([place])    Type II  . Dysrhythmia    Hx: of palpitations in 2010 only  . GERD (gastroesophageal reflux disease)   . Hypertension   . Pancreatic mass    Archie Endo 02/25/2016  . PONV (postoperative nausea and vomiting)    pt reports no PONV after biopsy on 12/24/15  . Right knee DJD   . Stress incontinence   . Urinary frequency   . Urinary urgency   . Wears glasses     Past Surgical History:  Procedure Laterality Date  . CESAREAN SECTION  1975  . COLONOSCOPY W/ BIOPSIES AND POLYPECTOMY     Hx: of in 2012  . DILATION AND CURETTAGE OF UTERUS     Hx: of 1978  . EUS N/A 12/24/2015   Procedure: UPPER ENDOSCOPIC ULTRASOUND (EUS) RADIAL;  Surgeon: Arta Silence, MD;  Location: [facility];  Service: Endoscopy;  Laterality: N/A;  . FINE NEEDLE ASPIRATION N/A  12/24/2015   Procedure: FINE NEEDLE ASPIRATION (FNA) RADIAL;  Surgeon: Arta Silence, MD;  Location: [facility];  Service: Endoscopy;  Laterality: N/A;  . JOINT REPLACEMENT     Hx: of left Knee 2010  . LAPAROSCOPIC DISTAL PANCREATECTOMY  02/25/2016   Diagnostic laparoscopy, laparoscopic hand-assisted   . LAPAROSCOPIC SPLENECTOMY N/A  02/25/2016   Procedure: LAPAROSCOPIC SPLENECTOMY;  Surgeon: Stark Klein, MD;  Location: [facility];  Service: General;  Laterality: N/A;  . PANCREATECTOMY N/A 02/25/2016   Procedure: LAPAROSCOPIC DISTAL PANCREATECTOMY;  Surgeon: Stark Klein, MD;  Location: [facility];  Service: General;  Laterality: N/A;  . SPLENECTOMY  02/25/2016  . TOTAL KNEE ARTHROPLASTY  2010  . TOTAL KNEE ARTHROPLASTY Right 06/26/2012   Dr Noemi Chapel  . TOTAL KNEE ARTHROPLASTY Right 06/26/2012   Procedure: TOTAL KNEE ARTHROPLASTY- right;  Surgeon: Lorn Junes, MD;  Location: [facility];  Service: Orthopedics;  Laterality: Right;    Family History  Problem Relation Age of Onset  . Diabetes Mellitus II Father   . Hypertension Father   . Cancer - Other Mother        Breast    Social History:  reports that she has never smoked. She has never used smokeless tobacco. She reports that she does not drink alcohol or use drugs.   Review of Systems   Lipid history: on Lipitor 40mg  with the following results, has been followed by PCP   LDL appears to be back under 100 with increasing her Lipitor and she is also doing better on her diet  HDL still relatively low  Lab Results  Component Value Date   CHOL 174 07/07/2017   CHOL 201 (H) 03/28/2017   CHOL 216 (H) 08/12/2016   Lab Results  Component Value Date   HDL 35.50 (L) 07/07/2017   HDL 39.20 03/28/2017   HDL 37.30 (L) 08/12/2016   Lab Results  Component Value Date   LDLCALC 101 (H) 07/07/2017   LDLCALC 131 (H) 03/28/2017   LDLCALC 144 (H) 08/12/2016   Lab Results  Component Value Date   TRIG 192.0 (H) 07/07/2017   TRIG 155.0 (H)  03/28/2017   TRIG 172.0 (H) 08/12/2016   Lab Results  Component Value Date   CHOLHDL 5 07/07/2017   CHOLHDL 5 03/28/2017   CHOLHDL 6 08/12/2016   Lab Results  Component Value Date   LDLDIRECT 98.0 12/20/2016   LDLDIRECT 188.0 12/15/2015   LDLDIRECT 192.4 06/09/2012            Hypertension: Followed by PCP  BP Readings from Last 3 Encounters:  07/14/17 (!) 142/60  07/07/17 124/72  04/26/17 122/68    HYPERKALEMIA: Potassium is slightly higher than normal  However her renal function appears to be better  Lab Results  Component Value Date   K 4.1 07/14/2017    Most recent eye exam was 08/2016  Most recent foot exam:8/18    LABS:  Office Visit on 07/14/2017  Component Date Value Ref Range Status  . Sodium 07/14/2017 140  135 - 145 mEq/L Final  . Potassium 07/14/2017 4.1  3.5 - 5.1 mEq/L Final  . Chloride 07/14/2017 104  96 - 112 mEq/L Final  . CO2 07/14/2017 29  19 - 32 mEq/L Final  . Glucose, Bld 07/14/2017 153* 70 - 99 mg/dL Final  . BUN 07/14/2017 27* 6 - 23 mg/dL Final  . Creatinine, Ser 07/14/2017 1.28* 0.40 - 1.20 mg/dL Final  . Total Bilirubin 07/14/2017 0.5  0.2 - 1.2 mg/dL Final  . Alkaline Phosphatase 07/14/2017 92  39 - 117 U/L Final  . AST 07/14/2017 14  0 - 37 U/L Final  . ALT 07/14/2017 11  0 - 35 U/L Final  . Total Protein 07/14/2017 6.8  6.0 - 8.3 g/dL Final  . Albumin 07/14/2017 4.0  3.5 - 5.2 g/dL Final  .  Calcium 07/14/2017 9.1  8.4 - 10.5 mg/dL Final  . GFR 07/14/2017 43.62* >60.00 mL/min Final    Physical Examination:  BP (!) 142/60 (BP Location: Left Arm, Patient Position: Sitting, Cuff Size: Normal)   Pulse 85   Ht 5\' 5"  (1.651 m)   Wt 156 lb 12.8 oz (71.1 kg)   SpO2 97%   BMI 26.09 kg/m       ASSESSMENT:  Diabetes type 2, non-insulin requiring   See history of present illness for  discussion of current diabetes management, blood sugar patterns and problems identified  Her A1c is now 6.8%  Her blood sugars have been  consistently well controlled with continuing her Janumet XR along with her 5 mg of glipizide ER Her weight is stable She is generally trying to be careful with her diet Also trying to be as active as possible but can do better   HYPERLIPIDEMIA: LDL is better with increasing her Lipitor to 40 mg but also she may be better on her diet  HYPERKALEMIA: This is unexplained Will check her potassium again today    PLAN:    She will continue her Janumet XR and glipizide regimen.  Discussed checking blood sugars more after meals  Also make sure she has no expired test strips  She will try to be consistent with walking or other exercise  Continue same dose of Lipitor      There are no Patient Instructions on file for this visit.   ADDENDUM: Potassium is normal  Elayne Snare 07/15/2017, 12:55 PM   Note: This office note was prepared with Dragon voice recognition system technology. Any transcriptional errors that result from this process are unintentional.

## 2017-07-20 ENCOUNTER — Encounter: Payer: Self-pay | Admitting: Interventional Cardiology

## 2017-07-20 ENCOUNTER — Ambulatory Visit (INDEPENDENT_AMBULATORY_CARE_PROVIDER_SITE_OTHER): Payer: Medicare Other | Admitting: Interventional Cardiology

## 2017-07-20 VITALS — BP 142/70 | HR 86 | Ht 65.0 in | Wt 157.6 lb

## 2017-07-20 DIAGNOSIS — E785 Hyperlipidemia, unspecified: Secondary | ICD-10-CM

## 2017-07-20 DIAGNOSIS — Z794 Long term (current) use of insulin: Secondary | ICD-10-CM

## 2017-07-20 DIAGNOSIS — E118 Type 2 diabetes mellitus with unspecified complications: Secondary | ICD-10-CM

## 2017-07-20 DIAGNOSIS — R55 Syncope and collapse: Secondary | ICD-10-CM

## 2017-07-20 NOTE — Progress Notes (Signed)
Cardiology Office Note    Date:  07/20/2017   ID:  Tracy Williams, DOB 01/12/1946, MRN [id number]  PCP:  Midge Minium, MD  Cardiologist: Sinclair Grooms, MD   Chief Complaint  Patient presents with  . Loss of Consciousness    History of Present Illness:  Tracy Williams is a 72 y.o. female referred for evaluation of a syncopal episode that occurred in early 2017/04/29.  The patient has a remote history of premature ventricular contractions, greater than 10 years ago.  The PVCs resolved after about a month and have not recurred.  During that time she underwent evaluation with a echocardiogram and cardiac evaluation without any abnormalities found.  She has been stable from a cardiac standpoint over the years until 03/30/23 when her husband died.  In early 04/29/22 she invited her neighbors over for dinner to thank them for all their help with Shanon Brow, her husband.  While preparing dinner she suddenly fainted.  She hit her face on a towel rack and had an abrasion.  She awakened quickly.  There was no prodrome or aftermath.  She has had no recurrence since that time.  She finished preparing the meal, 8 the donor without difficulty, cleaned up the house, and she went to sleep.  She has had no recurrence of any symptoms.  No prior history of fainting.  No palpitations, chest pain, dyspnea, or other complaints.    Past Medical History:  Diagnosis Date  . Allergy   . Anemia    hx  . Arthritis   . Diabetes mellitus without complication ([place])    Type II  . Dysrhythmia    Hx: of palpitations in 2010 only  . GERD (gastroesophageal reflux disease)   . Hypertension   . Pancreatic mass    Archie Endo 02/25/2016  . PONV (postoperative nausea and vomiting)    pt reports no PONV after biopsy on 12/24/15  . Right knee DJD   . Stress incontinence   . Urinary frequency   . Urinary urgency   . Wears glasses     Past Surgical History:  Procedure Laterality Date  . CESAREAN SECTION   1975  . COLONOSCOPY W/ BIOPSIES AND POLYPECTOMY     Hx: of in 2012  . DILATION AND CURETTAGE OF UTERUS     Hx: of 1978  . EUS N/A 12/24/2015   Procedure: UPPER ENDOSCOPIC ULTRASOUND (EUS) RADIAL;  Surgeon: Arta Silence, MD;  [facility];  Service: Endoscopy;  Laterality: N/A;  . FINE NEEDLE ASPIRATION N/A 12/24/2015   Procedure: FINE NEEDLE ASPIRATION (FNA) RADIAL;  Surgeon: Arta Silence, MD;  [facility];  Service: Endoscopy;  Laterality: N/A;  . JOINT REPLACEMENT     Hx: of left Knee 2010  . LAPAROSCOPIC DISTAL PANCREATECTOMY  02/25/2016   Diagnostic laparoscopy, laparoscopic hand-assisted   . LAPAROSCOPIC SPLENECTOMY N/A 02/25/2016   Procedure: LAPAROSCOPIC SPLENECTOMY;  Surgeon: Stark Klein, MD;  Location: [facility];  Service: General;  Laterality: N/A;  . PANCREATECTOMY N/A 02/25/2016   Procedure: LAPAROSCOPIC DISTAL PANCREATECTOMY;  Surgeon: Stark Klein, MD;  Location: [facility];  Service: General;  Laterality: N/A;  . SPLENECTOMY  02/25/2016  . TOTAL KNEE ARTHROPLASTY  2010  . TOTAL KNEE ARTHROPLASTY Right 06/26/2012   Dr Noemi Chapel  . TOTAL KNEE ARTHROPLASTY Right 06/26/2012   Procedure: TOTAL KNEE ARTHROPLASTY- right;  Surgeon: Lorn Junes, MD;  Location: [facility];  Service: Orthopedics;  Laterality: Right;    Current Medications: Outpatient  Medications Prior to Visit  Medication Sig Dispense Refill  . atorvastatin (LIPITOR) 40 MG tablet Take 1 tablet (40 mg total) by mouth daily. 90 tablet 3  . BIOTIN PO Take 1,000 mcg by mouth daily.     . Cholecalciferol (VITAMIN D3) 2000 units TABS Take 1 capsule by mouth daily.    Marland Kitchen glipiZIDE (GLUCOTROL XL) 5 MG 24 hr tablet TAKE 1 TABLET (5 MG TOTAL) BY MOUTH DAILY WITH BREAKFAST. 90 tablet 0  . glucose blood (FREESTYLE PRECISION NEO TEST) test strip Use as instructed 100 each 12  . ibuprofen (ADVIL,MOTRIN) 200 MG tablet Use as directed per bottle as needed for pain.    . Lancets (FREESTYLE) lancets Use one lancet each time  sugars are tested, pt tests 2-3 times daily. 100 each 12  . ranitidine (ZANTAC) 150 MG tablet Take 150 mg by mouth daily as needed for heartburn.     . sitaGLIPtin-metformin (JANUMET) 50-500 MG tablet Take 1 tablet by mouth daily.    Marland Kitchen acetaminophen (TYLENOL) 500 MG tablet Take 1,000 mg by mouth every 6 (six) hours as needed for mild pain or headache.    [name] Sodium (COLACE PO) Take 1 capsule by mouth.     . SitaGLIPtin-MetFORMIN HCl (JANUMET XR) 50-500 MG TB24 Take 1 tablet by mouth 2 (two) times daily. (Patient not taking: Reported on 07/20/2017) 60 tablet 3   No facility-administered medications prior to visit.      Allergies:   Lisinopril; Metformin and related; Darvocet [propoxyphene n-acetaminophen]; Demerol [meperidine]; Hydrochlorothiazide; Percocet [oxycodone-acetaminophen]; and Vicodin [hydrocodone-acetaminophen]   Social History   Socioeconomic History  . Marital status: Married    Spouse name: Not on file  . Number of children: Not on file  . Years of education: Not on file  . Highest education level: Not on file  Occupational History  . Not on file  Social Needs  . Financial resource strain: Not on file  . Food insecurity:    Worry: Not on file    Inability: Not on file  . Transportation needs:    Medical: Not on file    Non-medical: Not on file  Tobacco Use  . Smoking status: Never Smoker  . Smokeless tobacco: Never Used  Substance and Sexual Activity  . Alcohol use: No  . Drug use: No  . Sexual activity: Not on file  Lifestyle  . Physical activity:    Days per week: Not on file    Minutes per session: Not on file  . Stress: Not on file  Relationships  . Social connections:    Talks on phone: Not on file    Gets together: Not on file    Attends religious service: Not on file    Active member of club or organization: Not on file    Attends meetings of clubs or organizations: Not on file    Relationship status: Not on file  Other Topics Concern  .  Not on file  Social History Narrative  . Not on file     Family History:  The patient's family history includes Cancer - Other in her mother; Diabetes Mellitus II in her father; Hypertension in her father.   ROS:   Please see the history of present illness.    Depression and anxiety.  Some back discomfort.  Occasional palpitation. All other systems reviewed and are negative.   PHYSICAL EXAM:   VS:  BP (!) 142/70   Pulse 86   Ht 5\' 5"  (1.651  m)   Wt 157 lb 9.6 oz (71.5 kg)   BMI 26.23 kg/m    GEN: Well nourished, well developed, in no acute distress  HEENT: normal  Neck: no JVD, carotid bruits, or masses Cardiac: RRR; no murmurs, rubs, or gallops,no edema  Respiratory:  clear to auscultation bilaterally, normal work of breathing GI: soft, nontender, nondistended, + BS MS: no deformity or atrophy  Skin: warm and dry, no rash Neuro:  Alert and Oriented x 3, Strength and sensation are intact Psych: euthymic mood, full affect  Wt Readings from Last 3 Encounters:  07/20/17 157 lb 9.6 oz (71.5 kg)  07/14/17 156 lb 12.8 oz (71.1 kg)  07/07/17 156 lb 6 oz (70.9 kg)      Studies/Labs Reviewed:   EKG:  EKG from February 2019 demonstrates a normal appearance.  This was several weeks after the syncopal episode.  Recent Labs: 04/26/2017: Hemoglobin 12.4; Platelets 387.0; TSH 1.08 07/14/2017: ALT 11; BUN 27; Creatinine, Ser 1.28; Potassium 4.1; Sodium 140   Lipid Panel    Component Value Date/Time   CHOL 174 07/07/2017 1149   TRIG 192.0 (H) 07/07/2017 1149   HDL 35.50 (L) 07/07/2017 1149   CHOLHDL 5 07/07/2017 1149   VLDL 38.4 07/07/2017 1149   LDLCALC 101 (H) 07/07/2017 1149   LDLDIRECT 98.0 12/20/2016 1059    Additional studies/ records that were reviewed today include:  None    ASSESSMENT:    1. Syncope, unspecified syncope type   2. Hyperlipidemia LDL goal <70   3. Type 2 diabetes mellitus with complication, with long-term current use of insulin (H[facility])       PLAN:  In order of problems listed above:  1. Uncertain etiology for the episode of syncope which occurred in January under the cloud of stress caused by the recent death of her husband and preparing a large meal for her neighbors to thank them for their help with caring for her husband over the years.  There was no prodrome, nausea, vomiting, pallor.  She awakened from the event feeling well.  They did not summon EMS and she did not seek medical help.  She saw her primary physician a week or so later and an EKG was normal.  She denies interval palpitations.  No prior history of fainting.  She has gone about her typical activities.  Under the circumstances with a normal exam and no subsequent complaints, however recommend watchful waiting.  She has had a prior echocardiogram 10 years ago that demonstrated normal LV size and function.  Her exam is normal and the current EKG is normal.    Medication Adjustments/Labs and Tests Ordered: Current medicines are reviewed at length with the patient today.  Concerns regarding medicines are outlined above.  Medication changes, Labs and Tests ordered today are listed in the Patient Instructions below. Patient Instructions  Medication Instructions:  Your physician recommends that you continue on your current medications as directed. Please refer to the Current Medication list given to you today.  Labwork: None  Testing/Procedures: None  Follow-Up: Your physician recommends that you schedule a follow-up appointment as needed with Dr. Tamala Julian.     Any Other Special Instructions Will Be Listed Below (If Applicable).     If you need a refill on your cardiac medications before your next appointment, please call your pharmacy.      Signed, Sinclair Grooms, MD  07/20/2017 2:41 PM    [place],  [place]  [address] Phone: [phone]; Fax: [phone]

## 2017-07-20 NOTE — Patient Instructions (Signed)
Medication Instructions:  Your physician recommends that you continue on your current medications as directed. Please refer to the Current Medication list given to you today.  Labwork: None  Testing/Procedures: None  Follow-Up: Your physician recommends that you schedule a follow-up appointment as needed with Dr. Smith.     Any Other Special Instructions Will Be Listed Below (If Applicable).     If you need a refill on your cardiac medications before your next appointment, please call your pharmacy.   

## 2017-07-29 ENCOUNTER — Other Ambulatory Visit: Payer: Self-pay | Admitting: Family Medicine

## 2017-07-29 ENCOUNTER — Encounter: Payer: Self-pay | Admitting: Family Medicine

## 2017-07-29 MED ORDER — SITAGLIPTIN PHOS-METFORMIN HCL 50-500 MG PO TABS: 1.0000 | ORAL_TABLET | Freq: Two times a day (BID) | ORAL | 2 refills | Status: DC

## 2017-08-23 ENCOUNTER — Other Ambulatory Visit: Payer: Self-pay | Admitting: Family Medicine

## 2017-08-24 DIAGNOSIS — E119 Type 2 diabetes mellitus without complications: Secondary | ICD-10-CM | POA: Diagnosis not present

## 2017-08-24 LAB — HM DIABETES EYE EXAM

## 2017-10-22 ENCOUNTER — Other Ambulatory Visit: Payer: Self-pay | Admitting: Family Medicine

## 2017-11-14 ENCOUNTER — Other Ambulatory Visit: Payer: Medicare Other

## 2017-11-17 ENCOUNTER — Ambulatory Visit: Payer: Medicare Other | Admitting: Endocrinology

## 2017-11-23 ENCOUNTER — Other Ambulatory Visit: Payer: Self-pay | Admitting: Family Medicine

## 2017-11-23 DIAGNOSIS — Z1231 Encounter for screening mammogram for malignant neoplasm of breast: Secondary | ICD-10-CM

## 2017-12-22 ENCOUNTER — Ambulatory Visit
Admission: RE | Admit: 2017-12-22 | Discharge: 2017-12-22 | Disposition: A | Discharge status: Home / Self Care | Payer: Medicare Other | Source: Ambulatory Visit | Attending: Family Medicine | Admitting: Family Medicine

## 2017-12-22 DIAGNOSIS — Z1231 Encounter for screening mammogram for malignant neoplasm of breast: Secondary | ICD-10-CM

## 2018-01-04 ENCOUNTER — Other Ambulatory Visit: Payer: Self-pay

## 2018-01-04 ENCOUNTER — Encounter: Payer: Self-pay | Admitting: Family Medicine

## 2018-01-04 ENCOUNTER — Ambulatory Visit (INDEPENDENT_AMBULATORY_CARE_PROVIDER_SITE_OTHER): Payer: Medicare Other | Admitting: Family Medicine

## 2018-01-04 VITALS — BP 131/78 | HR 71 | Temp 98.2°F | Resp 16 | Ht 65.0 in | Wt 162.4 lb

## 2018-01-04 DIAGNOSIS — E119 Type 2 diabetes mellitus without complications: Secondary | ICD-10-CM | POA: Diagnosis not present

## 2018-01-04 DIAGNOSIS — I10 Essential (primary) hypertension: Secondary | ICD-10-CM | POA: Diagnosis not present

## 2018-01-04 DIAGNOSIS — E785 Hyperlipidemia, unspecified: Secondary | ICD-10-CM

## 2018-01-04 LAB — BASIC METABOLIC PANEL
BUN: 25 mg/dL — AB (ref 6–23)
CALCIUM: 9.6 mg/dL (ref 8.4–10.5)
CO2: 29 meq/L (ref 19–32)
CREATININE: 1.22 mg/dL — AB (ref 0.40–1.20)
Chloride: 104 mEq/L (ref 96–112)
GFR: 46.04 mL/min — ABNORMAL LOW (ref >60.00)
GLUCOSE: 118 mg/dL — AB (ref 70–99)
Potassium: 4.9 mEq/L (ref 3.5–5.1)
Sodium: 140 mEq/L (ref 135–145)

## 2018-01-04 LAB — CBC WITH DIFFERENTIAL/PLATELET
BASOS ABS: 0.1 10*3/uL (ref 0.0–0.1)
Basophils Relative: 0.6 % (ref 0.0–3.0)
EOS ABS: 0.4 10*3/uL (ref 0.0–0.7)
EOS PCT: 3.4 % (ref 0.0–5.0)
HCT: 36.6 % (ref 36.0–46.0)
Hemoglobin: 12.1 g/dL (ref 12.0–15.0)
Lymphocytes Relative: 14.4 % (ref 12.0–46.0)
Lymphs Abs: 1.6 10*3/uL (ref 0.7–4.0)
MCHC: 33 g/dL (ref 30.0–36.0)
MCV: 88.8 fl (ref 78.0–100.0)
MONO ABS: 0.8 10*3/uL (ref 0.1–1.0)
Monocytes Relative: 7.7 % (ref 3.0–12.0)
Neutro Abs: 8.1 10*3/uL — ABNORMAL HIGH (ref 1.4–7.7)
Neutrophils Relative %: 73.9 % (ref 43.0–77.0)
Platelets: 371 10*3/uL (ref 150.0–400.0)
RBC: 4.13 Mil/uL (ref 3.87–5.11)
RDW: 13.7 % (ref 11.5–15.5)
WBC: 11 10*3/uL — AB (ref 4.0–10.5)

## 2018-01-04 LAB — HEPATIC FUNCTION PANEL
ALBUMIN: 4.2 g/dL (ref 3.5–5.2)
ALK PHOS: 103 U/L (ref 39–117)
ALT: 13 U/L (ref 0–35)
AST: 12 U/L (ref 0–37)
Bilirubin, Direct: 0.1 mg/dL (ref 0.0–0.3)
TOTAL PROTEIN: 6.6 g/dL (ref 6.0–8.3)
Total Bilirubin: 0.8 mg/dL (ref 0.2–1.2)

## 2018-01-04 LAB — LIPID PANEL
CHOLESTEROL: 197 mg/dL (ref 0–200)
HDL: 39.5 mg/dL (ref >39.00)
LDL Cholesterol: 120 mg/dL — ABNORMAL HIGH (ref 0–99)
NONHDL: 157.22
Total CHOL/HDL Ratio: 5
Triglycerides: 188 mg/dL — ABNORMAL HIGH (ref 0.0–149.0)
VLDL: 37.6 mg/dL (ref 0.0–40.0)

## 2018-01-04 LAB — MICROALBUMIN / CREATININE URINE RATIO
Creatinine,U: 118.9 mg/dL
Microalb Creat Ratio: 0.6 mg/g (ref 0.0–30.0)
Microalb, Ur: 0.8 mg/dL (ref 0.0–1.9)

## 2018-01-04 LAB — HEMOGLOBIN A1C: HEMOGLOBIN A1C: 6.8 % — AB (ref 4.6–6.5)

## 2018-01-04 LAB — TSH: TSH: 1.34 u[IU]/mL (ref 0.35–4.50)

## 2018-01-04 NOTE — Assessment & Plan Note (Signed)
Chronic problem.  Well controlled w/o medication at this time.  Will continue to follow closely and restart meds prn.

## 2018-01-04 NOTE — Assessment & Plan Note (Signed)
Chronic problem.  On Janumet twice daily w/o difficulty.  UTD on eye exam.  Foot exam done today.  Microalbumin ordered.  Check labs.  Adjust meds prn

## 2018-01-04 NOTE — Progress Notes (Signed)
   Subjective:    Patient ID: Tracy Williams, female    DOB: August 28, 1945, 72 y.o.   MRN: [id number]  HPI HTN- chronic problem, has been able to wean off all BP meds.  BP remains within range.  No CP, SOB, HAs, visual changes, edema.    Hyperlipidemia- chronic problem, on Lipitor 40mg  daily.  No abd pain, N/V.  DM- chronic problem, on Janumet 50/500mg  BID and Glipizide 5mg  daily w/ hx of good control.  Pt reports feeling good.  Denies symptomatic lows.  No numbness/tingling of hands/feet.  No regular exercise but will sporadically exercise.   Review of Systems For ROS see HPI     Objective:   Physical Exam  Constitutional: She is oriented to person, place, and time. She appears well-developed and well-nourished. No distress.  HENT:  Head: Normocephalic and atraumatic.  Eyes: Pupils are equal, round, and reactive to light. Conjunctivae and EOM are normal.  Neck: Normal range of motion. Neck supple. No thyromegaly present.  Cardiovascular: Normal rate, regular rhythm, normal heart sounds and intact distal pulses.  No murmur heard. Pulmonary/Chest: Effort normal and breath sounds normal. No respiratory distress.  Abdominal: Soft. She exhibits no distension. There is no tenderness.  Musculoskeletal: She exhibits no edema.  Lymphadenopathy:    She has no cervical adenopathy.  Neurological: She is alert and oriented to person, place, and time.  Skin: Skin is warm and dry.  Psychiatric: She has a normal mood and affect. Her behavior is normal.  Vitals reviewed.         Assessment & Plan:

## 2018-01-04 NOTE — Patient Instructions (Signed)
Follow up in 6 months to recheck BP, Cholesterol, and diabetes We'll notify you of your lab results and make any changes if needed Keep up the good work on healthy diet and regular exercise- you can do it! Call with any questions or concerns Safe Travels and Have Fun!!!!

## 2018-01-04 NOTE — Assessment & Plan Note (Signed)
Chronic problem.  Tolerating statin w/o difficulty.  Check labs.  Adjust meds prn  

## 2018-01-31 ENCOUNTER — Other Ambulatory Visit: Payer: Self-pay | Admitting: Family Medicine

## 2018-02-06 ENCOUNTER — Other Ambulatory Visit: Payer: Self-pay

## 2018-03-10 ENCOUNTER — Telehealth: Payer: Self-pay

## 2018-03-10 NOTE — Telephone Encounter (Signed)
Spoke with patient to verify the need for Dr. Candee Furbish License number. Patient is a part of [place] [employer]. Dr. Birdie Riddle left the state license number spot blank while filling out for next year. Patient received state number after ok from Dr. Birdie Riddle.     Copied from [place] [id number]. Topic: General - Other >> Mar 10, 2018 11:53 AM Windy Kalata wrote: Reason for CRM: patient needs to get Dr. Candee Furbish License number for a form   Call back is [phone]

## 2018-04-17 ENCOUNTER — Encounter: Payer: Self-pay | Admitting: Family Medicine

## 2018-04-17 ENCOUNTER — Other Ambulatory Visit: Payer: Self-pay | Admitting: General Practice

## 2018-04-17 MED ORDER — GLUCOSE BLOOD VI STRP: ORAL_STRIP | 12 refills | Status: DC

## 2018-05-07 ENCOUNTER — Other Ambulatory Visit: Payer: Self-pay | Admitting: Family Medicine

## 2018-05-10 ENCOUNTER — Other Ambulatory Visit: Payer: Self-pay | Admitting: Family Medicine

## 2018-05-10 NOTE — Telephone Encounter (Signed)
Call to patient- patient had been paying out of pocket for testing supplies- but she has changed insurance and now has [employer] Gold Plus. She is willing to have new Rx for whatever meter/supplies they are covering at this time.

## 2018-05-10 NOTE — Telephone Encounter (Signed)
Copied from [place] [phone]. Topic: Quick Communication - Rx Refill/Question >> May 10, 2018 11:25 AM Judyann Munson wrote: Medication:glucose blood (FREESTYLE PRECISION NEO TEST) test strip - not covered under her insurance needs something different   Has the patient contacted their pharmacy? Yes  Preferred Pharmacy (with phone number or street name): [id number] [address], [place] [phone] (Phone) [phone] (Fax)    Agent: Please be advised that RX refills may take up to 3 business days. We ask that you follow-up with your pharmacy.

## 2018-05-11 NOTE — Telephone Encounter (Signed)
Advise patient to call her insurance to see which meters are covered with her plan and to call back.

## 2018-05-12 ENCOUNTER — Encounter: Payer: Self-pay | Admitting: Family Medicine

## 2018-05-12 ENCOUNTER — Other Ambulatory Visit: Payer: Self-pay | Admitting: General Practice

## 2018-05-12 MED ORDER — GLUCOSE BLOOD VI STRP: ORAL_STRIP | 3 refills | Status: DC

## 2018-05-12 MED ORDER — ACCU-CHEK SOFTCLIX LANCETS MISC: 3 refills | Status: DC

## 2018-05-12 MED ORDER — ACCU-CHEK AVIVA PLUS W/DEVICE KIT: PACK | 3 refills | Status: DC

## 2018-05-16 ENCOUNTER — Other Ambulatory Visit: Payer: Self-pay | Admitting: General Practice

## 2018-05-16 MED ORDER — BD SWAB SINGLE USE REGULAR PADS: MEDICATED_PAD | 3 refills | Status: DC

## 2018-05-16 MED ORDER — ACCU-CHEK AVIVA VI SOLN: 3 refills | Status: DC

## 2018-05-16 NOTE — Telephone Encounter (Signed)
Call to patient- left message that office is not familiar with what the preferred meter /supplies are for [place]- if she could contact them and find out and let PCP know that meter will be call ed to pharmacy for her.

## 2018-05-16 NOTE — Telephone Encounter (Signed)
Patient called to follow up on her supplies- she stated that she would like all of them sent to her local pharmacy- [facility]. They are covered if she gets them locally. Told patient I would relay the message so that the Rx could be redirected for her.

## 2018-05-17 ENCOUNTER — Other Ambulatory Visit: Payer: Self-pay | Admitting: General Practice

## 2018-05-17 ENCOUNTER — Encounter: Payer: Self-pay | Admitting: Family Medicine

## 2018-05-17 MED ORDER — ACCU-CHEK AVIVA PLUS W/DEVICE KIT: PACK | 3 refills | Status: DC

## 2018-05-17 MED ORDER — GLUCOSE BLOOD VI STRP: ORAL_STRIP | 3 refills | Status: DC

## 2018-05-17 MED ORDER — ACCU-CHEK GUIDE W/DEVICE KIT: 1.0000 | PACK | Freq: Three times a day (TID) | 3 refills | Status: DC

## 2018-05-17 MED ORDER — BD SWAB SINGLE USE REGULAR PADS: MEDICATED_PAD | 3 refills | Status: DC

## 2018-05-17 MED ORDER — ACCU-CHEK SOFTCLIX LANCETS MISC: 3 refills | Status: DC

## 2018-05-17 MED ORDER — GLUCOSE BLOOD VI STRP: ORAL_STRIP | 12 refills | Status: DC

## 2018-06-23 ENCOUNTER — Other Ambulatory Visit: Payer: Self-pay | Admitting: Endocrinology

## 2018-07-07 ENCOUNTER — Encounter: Payer: Self-pay | Admitting: Family Medicine

## 2018-07-07 ENCOUNTER — Other Ambulatory Visit: Payer: Self-pay

## 2018-07-07 ENCOUNTER — Ambulatory Visit (INDEPENDENT_AMBULATORY_CARE_PROVIDER_SITE_OTHER): Payer: Medicare HMO | Admitting: Family Medicine

## 2018-07-07 VITALS — BP 135/72 | HR 72 | Temp 97.3°F | Ht 65.0 in | Wt 162.0 lb

## 2018-07-07 DIAGNOSIS — E785 Hyperlipidemia, unspecified: Secondary | ICD-10-CM | POA: Diagnosis not present

## 2018-07-07 DIAGNOSIS — I10 Essential (primary) hypertension: Secondary | ICD-10-CM

## 2018-07-07 DIAGNOSIS — Z794 Long term (current) use of insulin: Secondary | ICD-10-CM

## 2018-07-07 DIAGNOSIS — E118 Type 2 diabetes mellitus with unspecified complications: Secondary | ICD-10-CM | POA: Diagnosis not present

## 2018-07-07 NOTE — Progress Notes (Signed)
Virtual Visit via Video   I connected with patient on 07/07/18 at 10:00 AM EDT by a video enabled telemedicine application and verified that I am speaking with the correct person using two identifiers.  Location patient: Home Location provider: [place] [place], Office Persons participating in the virtual visit: Patient, Provider, [place] ([place])  I discussed the limitations of evaluation and management by telemedicine and the availability of in person appointments. The patient expressed understanding and agreed to proceed.  Subjective:   HPI:   DM- chronic problem, on Glipizide '5mg'$ , Janumet 50/'500mg'$  w/ hx of good control.  UTD on eye exam.  Due for microalbumin and foot exam (not able to do virtually).  Weight is stable.  Pt is walking regularly.  'i'm feeling great'.  Denies symptomatic lows.  Home CBGs range 90-110s.  No weakness/numbness of hands/feet.  No sores on feet.  HTN- chronic problem.  Currently controlled w/o BP meds (had angioedema w/ ACE).  No CP, SOB, HAs, visual changes, edema.  Hyperlipidemia- chronic problem, on Lipitor '40mg'$  daily.  No abd pain, N/V.  ROS:   See pertinent positives and negatives per HPI.  Patient Active Problem List   Diagnosis Date Noted  . Angio-edema 01/19/2017  . Shingles 11/14/2015  . Pancreatic mass 11/09/2015  . Leukocytosis 11/09/2015  . Bilateral low back pain without sciatica 10/03/2014  . 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 Diabetes mellitus type 2, controlled, without complications ([facility]) 61/95/0932  . General medical examination 11/26/2010  . HTN (hypertension) 11/26/2010  . Colon cancer screening 11/26/2010  . Hyperlipidemia 11/26/2010    Social History   Tobacco Use  . Smoking status: Never Smoker  . Smokeless tobacco: Never Used  Substance Use Topics  . Alcohol use: No    Current Outpatient Medications:  .  ACCU-CHEK SOFTCLIX LANCETS lancets, Use as instructed to  test sugars 2-3 times daily. E11.9, Disp: 200 each, Rfl: 3 .  Alcohol Swabs (B-D SINGLE USE SWABS REGULAR) PADS, Use one swab each time sugars are tested. Dx E11.9, Disp: 100 each, Rfl: 3 .  atorvastatin (LIPITOR) 40 MG tablet, TAKE 1 TABLET BY MOUTH EVERY DAY, Disp: 90 tablet, Rfl: 3 .  Blood Glucose Monitoring Suppl (ACCU-CHEK GUIDE) w/Device KIT, 1 each by Does not apply route 3 (three) times daily., Disp: 1 kit, Rfl: 3 .  Cholecalciferol (VITAMIN D3) 2000 units TABS, Take 1 capsule by mouth daily., Disp: , Rfl:  .  glipiZIDE (GLUCOTROL XL) 5 MG 24 hr tablet, TAKE 1 TABLET BY MOUTH EVERY DAY WITH BREAKFAST, Disp: 90 tablet, Rfl: 0 .  glucose blood (ACCU-CHEK GUIDE) test strip, Use as instructed, Disp: 100 each, Rfl: 12 .  ibuprofen (ADVIL,MOTRIN) 200 MG tablet, Use as directed per bottle as needed for pain., Disp: , Rfl:  .  JANUMET 50-500 MG tablet, TAKE 1 TABLET BY MOUTH 2 (TWO) TIMES DAILY WITH A MEAL., Disp: 180 tablet, Rfl: 1 .  ranitidine (ZANTAC) 150 MG tablet, Take 150 mg by mouth daily as needed for heartburn. , Disp: , Rfl:   Allergies  Allergen Reactions  . Lisinopril Swelling    Caused throat and tongue to swell  . Metformin And Related Nausea And Vomiting  . Darvocet [Propoxyphene N-Acetaminophen] Nausea And Vomiting  . Demerol [Meperidine] Nausea And Vomiting  . Hydrochlorothiazide Nausea And Vomiting  . Percocet [Oxycodone-Acetaminophen] Nausea And Vomiting  . Vicodin [Hydrocodone-Acetaminophen] Nausea And Vomiting    Objective:   BP 135/72   Pulse  72   Temp (!) 97.3 F (36.3 C)   Ht '5\' 5"'$  (1.651 m)   Wt 162 lb (73.5 kg)   BMI 26.96 kg/m   AAOx3, NAD NCAT, EOMI No obvious CN deficits Coloring WNL Pt is able to speak clearly, coherently without shortness of breath or increased work of breathing.  Thought process is linear.  Mood is appropriate.  Assessment and Plan:   DM- chronic problem.  Home CBGs are excellent.  She is walking daily.  UTD on eye exam.   Reports no issues w/ her feet (unable to do virtual foot exam).  Will get microalbumin when she returns for labs.  No anticipated med changes but will decide based on A1C results.  HTN- continues to have adequate control w/o medication.  Will continue to follow.  Hyperlipidemia- chronic problem.  Tolerating statin w/o difficulty.  Check labs.  Adjust meds prn    [name], MD 07/07/2018

## 2018-07-07 NOTE — Progress Notes (Signed)
I have discussed the procedure for the virtual visit with the patient who has given consent to proceed with assessment and treatment.   BETHANY DILLARD, CMA     

## 2018-07-10 ENCOUNTER — Other Ambulatory Visit (INDEPENDENT_AMBULATORY_CARE_PROVIDER_SITE_OTHER): Payer: Medicare HMO

## 2018-07-10 DIAGNOSIS — E785 Hyperlipidemia, unspecified: Secondary | ICD-10-CM | POA: Diagnosis not present

## 2018-07-10 DIAGNOSIS — I10 Essential (primary) hypertension: Secondary | ICD-10-CM | POA: Diagnosis not present

## 2018-07-10 DIAGNOSIS — E118 Type 2 diabetes mellitus with unspecified complications: Secondary | ICD-10-CM | POA: Diagnosis not present

## 2018-07-10 DIAGNOSIS — Z794 Long term (current) use of insulin: Secondary | ICD-10-CM | POA: Diagnosis not present

## 2018-07-10 LAB — BASIC METABOLIC PANEL
BUN: 27 mg/dL — ABNORMAL HIGH (ref 6–23)
CO2: 27 mEq/L (ref 19–32)
Calcium: 9.4 mg/dL (ref 8.4–10.5)
Chloride: 102 mEq/L (ref 96–112)
Creatinine, Ser: 1.18 mg/dL (ref 0.40–1.20)
GFR: 44.95 mL/min — ABNORMAL LOW (ref >60.00)
Glucose, Bld: 113 mg/dL — ABNORMAL HIGH (ref 70–99)
Potassium: 4.6 mEq/L (ref 3.5–5.1)
Sodium: 139 mEq/L (ref 135–145)

## 2018-07-10 LAB — CBC WITH DIFFERENTIAL/PLATELET
Basophils Absolute: 0.1 10*3/uL (ref 0.0–0.1)
Basophils Relative: 0.5 % (ref 0.0–3.0)
Eosinophils Absolute: 0.3 10*3/uL (ref 0.0–0.7)
Eosinophils Relative: 2.4 % (ref 0.0–5.0)
HCT: 36.8 % (ref 36.0–46.0)
Hemoglobin: 12.3 g/dL (ref 12.0–15.0)
Lymphocytes Relative: 14.8 % (ref 12.0–46.0)
Lymphs Abs: 1.5 10*3/uL (ref 0.7–4.0)
MCHC: 33.5 g/dL (ref 30.0–36.0)
MCV: 88.9 fl (ref 78.0–100.0)
Monocytes Absolute: 0.8 10*3/uL (ref 0.1–1.0)
Monocytes Relative: 8.1 % (ref 3.0–12.0)
Neutro Abs: 7.8 10*3/uL — ABNORMAL HIGH (ref 1.4–7.7)
Neutrophils Relative %: 74.2 % (ref 43.0–77.0)
Platelets: 340 10*3/uL (ref 150.0–400.0)
RBC: 4.14 Mil/uL (ref 3.87–5.11)
RDW: 14 % (ref 11.5–15.5)
WBC: 10.4 10*3/uL (ref 4.0–10.5)

## 2018-07-10 LAB — HEPATIC FUNCTION PANEL
ALT: 10 U/L (ref 0–35)
AST: 13 U/L (ref 0–37)
Albumin: 4.1 g/dL (ref 3.5–5.2)
Alkaline Phosphatase: 97 U/L (ref 39–117)
Bilirubin, Direct: 0.1 mg/dL (ref 0.0–0.3)
Total Bilirubin: 0.7 mg/dL (ref 0.2–1.2)
Total Protein: 6.5 g/dL (ref 6.0–8.3)

## 2018-07-10 LAB — LIPID PANEL
Cholesterol: 156 mg/dL (ref 0–200)
HDL: 32.9 mg/dL — ABNORMAL LOW (ref >39.00)
LDL Cholesterol: 96 mg/dL (ref 0–99)
NonHDL: 123.09
Total CHOL/HDL Ratio: 5
Triglycerides: 137 mg/dL (ref 0.0–149.0)
VLDL: 27.4 mg/dL (ref 0.0–40.0)

## 2018-07-10 LAB — MICROALBUMIN / CREATININE URINE RATIO
Creatinine,U: 201.4 mg/dL
Microalb Creat Ratio: 0.8 mg/g (ref 0.0–30.0)
Microalb, Ur: 1.6 mg/dL (ref 0.0–1.9)

## 2018-07-10 LAB — TSH: TSH: 1.48 u[IU]/mL (ref 0.35–4.50)

## 2018-07-10 LAB — HEMOGLOBIN A1C: Hgb A1c MFr Bld: 6.8 % — ABNORMAL HIGH (ref 4.6–6.5)

## 2018-07-13 ENCOUNTER — Ambulatory Visit (INDEPENDENT_AMBULATORY_CARE_PROVIDER_SITE_OTHER): Payer: Medicare HMO | Admitting: Family Medicine

## 2018-07-13 ENCOUNTER — Encounter: Payer: Self-pay | Admitting: Family Medicine

## 2018-07-13 VITALS — BP 133/77 | HR 74 | Ht 65.0 in

## 2018-07-13 DIAGNOSIS — I10 Essential (primary) hypertension: Secondary | ICD-10-CM | POA: Diagnosis not present

## 2018-07-13 NOTE — Progress Notes (Signed)
 Virtual Visit via Video   I connected with patient on 07/13/18 at  9:40 AM EDT by a video enabled telemedicine application and verified that I am speaking with the correct person using two identifiers.  Location patient: Home Location provider: [place], Office Persons participating in the virtual visit: Patient, Provider, CMA (Bethany Dillard)  I discussed the limitations of evaluation and management by telemedicine and the availability of in person appointments. The patient expressed understanding and agreed to proceed.  Subjective:   HPI:  HTN- not currently on meds after having an allergic rxn to Lisinopril.  BP was quite elevated when she came for lab draw on 4/20 (unfortunately this was not documented)  Today's reading is 133/77 which is more in line w/ her usual readings.  Pt reports feeling 'fine'.  No CP, SOB, HAs, visual changes.  Pt has been taking home BPs-  ROS:   See pertinent positives and negatives per HPI.  Patient Active Problem List   Diagnosis Date Noted  . Angio-edema 01/19/2017  . Shingles 11/14/2015  . Pancreatic mass 11/09/2015  . Leukocytosis 11/09/2015  . Bilateral low back pain without sciatica 10/03/2014  . 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 Type 2 diabetes mellitus with complication, with long-term current use of insulin ([facility]) 06/09/2012  . General medical examination 11/26/2010  . HTN (hypertension) 11/26/2010  . Colon cancer screening 11/26/2010  . Hyperlipidemia 11/26/2010    Social History   Tobacco Use  . Smoking status: Never Smoker  . Smokeless tobacco: Never Used  Substance Use Topics  . Alcohol use: No    Current Outpatient Medications:  .  ACCU-CHEK SOFTCLIX LANCETS lancets, Use as instructed to test sugars 2-3 times daily. E11.9, Disp: 200 each, Rfl: 3 .  Alcohol Swabs (B-D SINGLE USE SWABS REGULAR) PADS, Use one swab each time sugars are tested. Dx E11.9, Disp: 100 each, Rfl: 3  .  atorvastatin (LIPITOR) 40 MG tablet, TAKE 1 TABLET BY MOUTH EVERY DAY, Disp: 90 tablet, Rfl: 3 .  Blood Glucose Monitoring Suppl (ACCU-CHEK GUIDE) w/Device KIT, 1 each by Does not apply route 3 (three) times daily., Disp: 1 kit, Rfl: 3 .  Cholecalciferol (VITAMIN D3) 2000 units TABS, Take 1 capsule by mouth daily., Disp: , Rfl:  .  glipiZIDE (GLUCOTROL XL) 5 MG 24 hr tablet, TAKE 1 TABLET BY MOUTH EVERY DAY WITH BREAKFAST, Disp: 90 tablet, Rfl: 0 .  glucose blood (ACCU-CHEK GUIDE) test strip, Use as instructed, Disp: 100 each, Rfl: 12 .  ibuprofen (ADVIL,MOTRIN) 200 MG tablet, Use as directed per bottle as needed for pain., Disp: , Rfl:  .  JANUMET 50-500 MG tablet, TAKE 1 TABLET BY MOUTH 2 (TWO) TIMES DAILY WITH A MEAL., Disp: 180 tablet, Rfl: 1 .  ranitidine (ZANTAC) 150 MG tablet, Take 150 mg by mouth daily as needed for heartburn. , Disp: , Rfl:   Allergies  Allergen Reactions  . Lisinopril Swelling    Caused throat and tongue to swell  . Metformin And Related Nausea And Vomiting  . Darvocet [Propoxyphene N-Acetaminophen] Nausea And Vomiting  . Demerol [Meperidine] Nausea And Vomiting  . Hydrochlorothiazide Nausea And Vomiting  . Percocet [Oxycodone-Acetaminophen] Nausea And Vomiting  . Vicodin [Hydrocodone-Acetaminophen] Nausea And Vomiting    Objective:   There were no vitals taken for this visit.  AAOx3, NAD NCAT, EOMI No obvious CN deficits Coloring WNL Pt is able to speak clearly, coherently without shortness of breath or increased work of   breathing.  Thought process is linear.  Mood is appropriate.   Assessment and Plan:   HTN- much better control today compared w/ lab visit on 4/20 (SBP was in the 170s on that day).  She is asymptomatic and would prefer to remain off BP meds at this time.  Will continue to follow.  [name], MD 07/13/2018

## 2018-07-14 ENCOUNTER — Other Ambulatory Visit: Payer: Self-pay | Admitting: Family Medicine

## 2018-08-08 ENCOUNTER — Other Ambulatory Visit: Payer: Self-pay | Admitting: Family Medicine

## 2018-09-29 ENCOUNTER — Encounter (HOSPITAL_COMMUNITY): Payer: Self-pay

## 2018-09-29 ENCOUNTER — Other Ambulatory Visit: Payer: Self-pay

## 2018-09-29 ENCOUNTER — Emergency Department (HOSPITAL_COMMUNITY)
Admission: EM | Admit: 2018-09-29 | Discharge: 2018-09-29 | Disposition: A | Discharge status: Home / Self Care | Payer: Medicare HMO | Attending: Emergency Medicine | Admitting: Emergency Medicine

## 2018-09-29 ENCOUNTER — Ambulatory Visit: Payer: Self-pay

## 2018-09-29 DIAGNOSIS — E119 Type 2 diabetes mellitus without complications: Secondary | ICD-10-CM | POA: Diagnosis not present

## 2018-09-29 DIAGNOSIS — I10 Essential (primary) hypertension: Secondary | ICD-10-CM | POA: Diagnosis not present

## 2018-09-29 DIAGNOSIS — Z7984 Long term (current) use of oral hypoglycemic drugs: Secondary | ICD-10-CM | POA: Diagnosis not present

## 2018-09-29 DIAGNOSIS — Z96653 Presence of artificial knee joint, bilateral: Secondary | ICD-10-CM | POA: Diagnosis not present

## 2018-09-29 DIAGNOSIS — Z79899 Other long term (current) drug therapy: Secondary | ICD-10-CM | POA: Insufficient documentation

## 2018-09-29 DIAGNOSIS — R42 Dizziness and giddiness: Secondary | ICD-10-CM

## 2018-09-29 DIAGNOSIS — R112 Nausea with vomiting, unspecified: Secondary | ICD-10-CM | POA: Diagnosis not present

## 2018-09-29 LAB — COMPREHENSIVE METABOLIC PANEL
ALT: 16 U/L (ref 0–44)
AST: 36 U/L (ref 15–41)
Albumin: 4.1 g/dL (ref 3.5–5.0)
Alkaline Phosphatase: 106 U/L (ref 38–126)
Anion gap: 11 (ref 5–15)
BUN: 26 mg/dL — ABNORMAL HIGH (ref 8–23)
CO2: 24 mmol/L (ref 22–32)
Calcium: 9.1 mg/dL (ref 8.9–10.3)
Chloride: 102 mmol/L (ref 98–111)
Creatinine, Ser: 1.23 mg/dL — ABNORMAL HIGH (ref 0.44–1.00)
GFR calc Af Amer: 51 mL/min — ABNORMAL LOW (ref >60)
GFR calc non Af Amer: 44 mL/min — ABNORMAL LOW (ref >60)
Glucose, Bld: 126 mg/dL — ABNORMAL HIGH (ref 70–99)
Potassium: 5.6 mmol/L — ABNORMAL HIGH (ref 3.5–5.1)
Sodium: 137 mmol/L (ref 135–145)
Total Bilirubin: 1.8 mg/dL — ABNORMAL HIGH (ref 0.3–1.2)
Total Protein: 7.7 g/dL (ref 6.5–8.1)

## 2018-09-29 LAB — URINALYSIS, ROUTINE W REFLEX MICROSCOPIC
Bilirubin Urine: NEGATIVE
Glucose, UA: NEGATIVE mg/dL
Hgb urine dipstick: NEGATIVE
Ketones, ur: NEGATIVE mg/dL
Leukocytes,Ua: NEGATIVE
Nitrite: NEGATIVE
Protein, ur: NEGATIVE mg/dL
Specific Gravity, Urine: 1.018 (ref 1.005–1.030)
pH: 6 (ref 5.0–8.0)

## 2018-09-29 LAB — LIPASE, BLOOD: Lipase: 32 U/L (ref 11–51)

## 2018-09-29 MED ORDER — SODIUM CHLORIDE 0.9 % IV BOLUS: 500.0000 mL | Freq: Once | INTRAVENOUS | Status: DC

## 2018-09-29 MED ORDER — SODIUM CHLORIDE 0.9% FLUSH: 3.0000 mL | Freq: Once | INTRAVENOUS | Status: DC

## 2018-09-29 NOTE — Telephone Encounter (Signed)
Called patient - She states that she was told someone would call her for an appointment.   Per Dr. Birdie Riddle, patient was advised to go to ED for evaluation.  Pt stated verbal understanding and states that a friend is available to drive her. PCP has been alerted that patient is going to [facility].

## 2018-09-29 NOTE — Telephone Encounter (Signed)
Pt. Called with c/o onset of dizziness last night at 11:30 PM.  Described that it feels like either the room is spinning or she is tilting.  Stated the dizziness is moderate to severe.  Vomited x 2 this AM.  Reported blood sugar of 108, and BP 139/76 @ 7:30 AM.  C/o feeling weak.  Denied headache, temp. loss of vision, weakness of unilateral extremities, or slurred speech.  Stated "it is hard to walk without tipping over."  Denied feeling faint. Stated the symptom is worse with sitting up.  Has not eaten or taken her medication this morning.     Called office ; spoke to [employer].  Pt's call dropped in the attempt to transfer.  Office [place], [place], stated she will call the pt. to get her scheduled for an appt.     Reason for Disposition . [1] Dizziness (vertigo) present now AND [2] one or more stroke risk factors (i.e., hypertension, diabetes, prior stroke/TIA/heart attack)  (Exception: prior physician evaluation for this AND no different/worse than usual)  Answer Assessment - Initial Assessment Questions 1. DESCRIPTION: "Describe your dizziness."     It is hard to walk without tipping over 2. VERTIGO: "Do you feel like either you or the room is spinning or tilting?"      Feels like she is tilting 3. LIGHTHEADED: "Do you feel lightheaded?" (e.g., somewhat faint, woozy, weak upon standing)     Doesn't feel like she is going to pass out. 4. SEVERITY: "How bad is it?"  "Can you walk?"   - MILD - Feels unsteady but walking normally.   - MODERATE - Feels very unsteady when walking, but not falling; interferes with normal activities (e.g., school, work) .   - SEVERE - Unable to walk without falling (requires assistance).     Moderate - severe 5. ONSET:  "When did the dizziness begin?"     About 11:30 PM  6. AGGRAVATING FACTORS: "Does anything make it worse?" (e.g., standing, change in head position)     Sitting up 7. CAUSE: "What do you think is causing the dizziness?"    Unknown  8.  RECURRENT SYMPTOM: "Have you had dizziness before?" If so, ask: "When was the last time?" "What happened that time?"     Has had temporary episodes of dizziness that resolved on its own 9. OTHER SYMPTOMS: "Do you have any other symptoms?" (e.g., headache, weakness, numbness, vomiting, earache)    C/o weakness, c/o nausea/ vomiting x 2, denied weakness of extremities or slurred speech or temp. Loss of vision, denied headache  10. PREGNANCY: "Is there any chance you are pregnant?" "When was your last menstrual period?"       N/a  Protocols used: DIZZINESS - VERTIGO-A-AH

## 2018-09-29 NOTE — Discharge Instructions (Signed)
It was my pleasure taking care of you today!   We were planning to have labs drawn today to ensure no emergent medical problems. I strongly suggest calling your primary care doctor to schedule a follow up appointment. You can return to ER at any time. Please return to ER immediately if symptoms return.

## 2018-09-29 NOTE — Telephone Encounter (Signed)
Pt was called and informed directly by [facility] the per PCP she is to got to ED. Stated an understanding and her neighbor is going to drive her.

## 2018-09-29 NOTE — ED Triage Notes (Addendum)
Patient states she was dizzy last night, went to bed, woke up this morning, and was still dizzy and nauscious.   Patient states she has vomited 3X since 0800.   Denies abdominal pain.  Patient had 2 normal BMs this morning.    A/Ox4 Ambulatory in triage.   Hx. DM 2 and hypertension   CBG at home 108 per patient.

## 2018-09-29 NOTE — ED Notes (Signed)
Patient unable to urinate at this time due to going to the bathroom before coming into ED. Will try again later.

## 2018-09-29 NOTE — ED Provider Notes (Signed)
Medical screening examination/treatment/procedure(s) were conducted as a shared visit with non-physician practitioner(s) and myself.  I personally evaluated the patient during the encounter.    73 year old female here with episodic dizziness which is since resolved.  She had no associated neurological complaints.  She had no chest pain or shortness of breath.  Feels back to her baseline.  And will be discharged home   Lacretia Leigh, MD 09/29/18 1504

## 2018-09-29 NOTE — ED Provider Notes (Signed)
[facility] DEPT Provider Note   CSN: [id number] Arrival date & time: 09/29/18  1049    History   Chief Complaint Chief Complaint  Patient presents with  . Dizziness  . Nausea    HPI Tracy Williams is a 73 y.o. female.     The history is provided by the patient and medical records. No language interpreter was used.  Dizziness Associated symptoms: nausea and vomiting    Tracy Williams is a 73 y.o. female  with a PMH as listed below who presents to the Emergency Department complaining of dizziness.  Patient states that she was sitting in her chair watching TV last night when she decided to get up and go to bed around 10 PM.  When she got up, she suddenly felt very dizzy.  She reports feeling like she was spinning and had a difficult time with ambulation.  She did not feel as if she was leaning more towards one side versus the other, just overall felt unsteady on her feet.  She had 3 episodes of emesis in total.  She denies any abdominal pain.  She states all of these episodes occurred after she had just gotten up.  She feels as if movement makes her very nauseous which leads to her vomiting.  This morning, she had continued to have symptoms, therefore called a friend to help her come to the emergency room.  She reports that when her friend got to her home, she started feeling better, but decided to come in anyway.  She now reports complete resolution of her dizziness.  She is no longer nauseous.  She never had any numbness or weakness.  Never had falls.  No difficulty with her speech.  She does report distant history of issues with her "inner ear".  She was given exercises to do.  She has had symptoms in the past sounding consistent with vertigo, but states that her symptoms yesterday and this morning were much more severe in nature than she remembers having in the past.  No headaches.  No medications taken prior to arrival for symptoms.  Past Medical  History:  Diagnosis Date  . Allergy   . Anemia    hx  . Arthritis   . Diabetes mellitus without complication ([place])    Type II  . Dysrhythmia    Hx: of palpitations in 2010 only  . GERD (gastroesophageal reflux disease)   . Hypertension   . Pancreatic mass    Archie Endo 02/25/2016  . PONV (postoperative nausea and vomiting)    pt reports no PONV after biopsy on 12/24/15  . Right knee DJD   . Stress incontinence   . Urinary frequency   . Urinary urgency   . Wears glasses     Patient Active Problem List   Diagnosis Date Noted  . Angio-edema 01/19/2017  . Shingles 11/14/2015  . Pancreatic mass 11/09/2015  . Leukocytosis 11/09/2015  . Bilateral low back pain without sciatica 10/03/2014  . 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 Type 2 diabetes mellitus with complication, with long-term current use of insulin ([place]) 06/09/2012  . General medical examination 11/26/2010  . HTN (hypertension) 11/26/2010  . Colon cancer screening 11/26/2010  . Hyperlipidemia 11/26/2010    Past Surgical History:  Procedure Laterality Date  . CESAREAN SECTION  1975  . COLONOSCOPY W/ BIOPSIES AND POLYPECTOMY     Hx: of in 2012  . DILATION AND CURETTAGE OF UTERUS  Hx: of 1978  . EUS N/A 12/24/2015   Procedure: UPPER ENDOSCOPIC ULTRASOUND (EUS) RADIAL;  Surgeon: Arta Silence, MD;  Location: [facility];  Service: Endoscopy;  Laterality: N/A;  . FINE NEEDLE ASPIRATION N/A 12/24/2015   Procedure: FINE NEEDLE ASPIRATION (FNA) RADIAL;  Surgeon: Arta Silence, MD;  Location: [facility];  Service: Endoscopy;  Laterality: N/A;  . JOINT REPLACEMENT     Hx: of left Knee 2010  . LAPAROSCOPIC DISTAL PANCREATECTOMY  02/25/2016   Diagnostic laparoscopy, laparoscopic hand-assisted   . LAPAROSCOPIC SPLENECTOMY N/A 02/25/2016   Procedure: LAPAROSCOPIC SPLENECTOMY;  Surgeon: Stark Klein, MD;  Location: [facility];  Service: General;  Laterality: N/A;  . PANCREATECTOMY N/A 02/25/2016    Procedure: LAPAROSCOPIC DISTAL PANCREATECTOMY;  Surgeon: Stark Klein, MD;  Location: [place];  Service: General;  Laterality: N/A;  . SPLENECTOMY  02/25/2016  . TOTAL KNEE ARTHROPLASTY  2010  . TOTAL KNEE ARTHROPLASTY Right 06/26/2012   Dr Noemi Chapel  . TOTAL KNEE ARTHROPLASTY Right 06/26/2012   Procedure: TOTAL KNEE ARTHROPLASTY- right;  Surgeon: Lorn Junes, MD;  Location: [facility];  Service: Orthopedics;  Laterality: Right;     OB History   No obstetric history on file.      Home Medications    Prior to Admission medications   Medication Sig Start Date End Date Taking? Authorizing Provider  ACCU-CHEK SOFTCLIX LANCETS lancets Use as instructed to test sugars 2-3 times daily. E11.9 05/17/18  Yes Midge Minium, MD  Alcohol Swabs (B-D SINGLE USE SWABS REGULAR) PADS USE ONE SWAB EACH TIME SUGARS ARE TESTED AS DIRECTED 07/17/18  Yes Midge Minium, MD  atorvastatin (LIPITOR) 40 MG tablet TAKE 1 TABLET BY MOUTH EVERY DAY Patient taking differently: Take 40 mg by mouth daily at 6 PM.  06/23/18  Yes Elayne Snare, MD  Blood Glucose Monitoring Suppl (ACCU-CHEK GUIDE) w/Device KIT 1 each by Does not apply route 3 (three) times daily. 05/17/18  Yes Midge Minium, MD  Cholecalciferol (VITAMIN D3) 2000 units TABS Take 1 capsule by mouth daily.   Yes [provider]  glipiZIDE (GLUCOTROL XL) 5 MG 24 hr tablet TAKE 1 TABLET BY MOUTH EVERY DAY WITH BREAKFAST Patient taking differently: Take 5 mg by mouth daily with breakfast.  08/08/18  Yes Midge Minium, MD  glucose blood (ACCU-CHEK GUIDE) test strip Use as instructed 05/17/18  Yes Midge Minium, MD  ibuprofen (ADVIL,MOTRIN) 200 MG tablet Use as directed per bottle as needed for pain.   Yes [provider]  JANUMET 50-500 MG tablet TAKE 1 TABLET BY MOUTH 2 (TWO) TIMES DAILY WITH A MEAL. Patient taking differently: Take 1 tablet by mouth 2 (two) times daily with a meal.  08/23/17  Yes Midge Minium, MD     Family History Family History  Problem Relation Age of Onset  . Diabetes Mellitus II Father   . Hypertension Father   . Cancer - Other Mother        Breast    Social History Social History   Tobacco Use  . Smoking status: Never Smoker  . Smokeless tobacco: Never Used  Substance Use Topics  . Alcohol use: No  . Drug use: No     Allergies   Lisinopril, Metformin and related, Darvocet [propoxyphene n-acetaminophen], Demerol [meperidine], Hydrochlorothiazide, Percocet [oxycodone-acetaminophen], and Vicodin [hydrocodone-acetaminophen]   Review of Systems Review of Systems  Gastrointestinal: Positive for nausea and vomiting. Negative for abdominal pain.  Neurological: Positive for dizziness.  All other systems  reviewed and are negative.    Physical Exam Updated Vital Signs BP 140/60   Pulse 66   Temp 98.3 F (36.8 C) (Oral)   Resp 16   SpO2 93%   Physical Exam Vitals signs and nursing note reviewed.  Constitutional:      General: She is not in acute distress.    Appearance: She is well-developed.  HENT:     Head: Normocephalic and atraumatic.  Neck:     Musculoskeletal: Neck supple.     Comments: No midline or paraspinal tenderness. No rigidity. Movement of head/neck not reproducing symptoms. Cardiovascular:     Rate and Rhythm: Normal rate and regular rhythm.     Heart sounds: Normal heart sounds. No murmur.  Pulmonary:     Effort: Pulmonary effort is normal. No respiratory distress.     Breath sounds: Normal breath sounds.  Abdominal:     General: There is no distension.     Palpations: Abdomen is soft.     Tenderness: There is no abdominal tenderness.  Skin:    General: Skin is warm and dry.  Neurological:     Mental Status: She is alert and oriented to person, place, and time.     Comments: Alert, oriented, thought content appropriate, able to give a coherent history. Speech is clear and goal oriented, able to follow commands.  Cranial Nerves:  II:   Peripheral visual fields grossly normal, pupils equal, round, reactive to light III, IV, VI: EOM intact bilaterally, ptosis not present V,VII: smile symmetric, eyes kept closed tightly against resistance, facial light touch sensation equal VIII: hearing grossly normal IX, X: symmetric soft palate movement, uvula elevates symmetrically  XI: bilateral shoulder shrug symmetric and strong XII: midline tongue extension 5/5 muscle strength in upper and lower extremities bilaterally including strong and equal grip strength and dorsiflexion/plantar flexion Sensory to light touch normal in all four extremities.  Normal finger-to-nose and rapid alternating movements; normal gait and balance. Negative romberg, no pronator drift.      ED Treatments / Results  Labs (all labs ordered are listed, but only abnormal results are displayed) Labs Reviewed  COMPREHENSIVE METABOLIC PANEL - Abnormal; Notable for the following components:      Result Value   Potassium 5.6 (*)    Glucose, Bld 126 (*)    BUN 26 (*)    Creatinine, Ser 1.23 (*)    Total Bilirubin 1.8 (*)    GFR calc non Af Amer 44 (*)    GFR calc Af Amer 51 (*)    All other components within normal limits  LIPASE, BLOOD  URINALYSIS, ROUTINE W REFLEX MICROSCOPIC  POTASSIUM  CBC    EKG None  Radiology No results found.  Procedures Procedures (including critical care time)  Medications Ordered in ED Medications  sodium chloride flush (NS) 0.9 % injection 3 mL (has no administration in time range)  sodium chloride 0.9 % bolus 500 mL (has no administration in time range)     Initial Impression / Assessment and Plan / ED Course  I have reviewed the triage vital signs and the nursing notes.  Pertinent labs & imaging results that were available during my care of the patient were reviewed by me and considered in my medical decision making (see chart for details).       KALEEAH GINGERICH is a 73 y.o. female who presents to ED  for dizziness which began last night. She actually notes that her symptoms began improving on the  drive to ED. During my evaluation, all of her symptoms have resolved.  She has no cranial nerve deficits.  Strength and sensation is equal and intact throughout.  She ambulated several times in the emergency department without difficulty.  Orthostatics are normal.  Seems as if this was more of a vertiginous etiology and she does have history of such per patient.  CMP resulted showing hyperkalemia at 5.6.  CBC apparently clotted.  Suspect degree of hemolysis.  Plan was to repeat potassium and get CBC.  Patient informed nurse and myself that she feels completely better and does not want lab redraw.  Reasons for obtaining redraw of potassium especially was discussed and patient was made aware that we are unable to determine if life-threatening /emergent issue without redraw.  She is aware.  She will follow up with PCP.  Understands that she can return to the ER at any time.  All questions answered.  Patient seen by and discussed with Dr. Zenia Resides.   Final Clinical Impressions(s) / ED Diagnoses   Final diagnoses:  Dizziness    ED Discharge Orders    None       Ward, Ozella Almond, PA-C 09/29/18 1652    Lacretia Leigh, MD 10/01/18 1408

## 2018-10-13 ENCOUNTER — Encounter (HOSPITAL_COMMUNITY): Payer: Self-pay | Admitting: Emergency Medicine

## 2018-10-13 ENCOUNTER — Emergency Department (HOSPITAL_COMMUNITY)
Admission: EM | Admit: 2018-10-13 | Discharge: 2018-10-14 | Disposition: A | Discharge status: Home / Self Care | Payer: Medicare HMO | Attending: Emergency Medicine | Admitting: Emergency Medicine

## 2018-10-13 ENCOUNTER — Other Ambulatory Visit: Payer: Self-pay

## 2018-10-13 DIAGNOSIS — T7840XA Allergy, unspecified, initial encounter: Secondary | ICD-10-CM | POA: Diagnosis not present

## 2018-10-13 DIAGNOSIS — I10 Essential (primary) hypertension: Secondary | ICD-10-CM | POA: Insufficient documentation

## 2018-10-13 DIAGNOSIS — E119 Type 2 diabetes mellitus without complications: Secondary | ICD-10-CM | POA: Diagnosis not present

## 2018-10-13 DIAGNOSIS — Z7984 Long term (current) use of oral hypoglycemic drugs: Secondary | ICD-10-CM | POA: Diagnosis not present

## 2018-10-13 DIAGNOSIS — Z79899 Other long term (current) drug therapy: Secondary | ICD-10-CM | POA: Insufficient documentation

## 2018-10-13 DIAGNOSIS — T783XXA Angioneurotic edema, initial encounter: Secondary | ICD-10-CM

## 2018-10-13 DIAGNOSIS — K148 Other diseases of tongue: Secondary | ICD-10-CM | POA: Diagnosis present

## 2018-10-13 DIAGNOSIS — Z96653 Presence of artificial knee joint, bilateral: Secondary | ICD-10-CM | POA: Insufficient documentation

## 2018-10-13 DIAGNOSIS — R9431 Abnormal electrocardiogram [ECG] [EKG]: Secondary | ICD-10-CM | POA: Diagnosis not present

## 2018-10-13 LAB — CBC WITH DIFFERENTIAL/PLATELET
Abs Immature Granulocytes: 0.05 10*3/uL (ref 0.00–0.07)
Basophils Absolute: 0.1 10*3/uL (ref 0.0–0.1)
Basophils Relative: 0 %
Eosinophils Absolute: 0.3 10*3/uL (ref 0.0–0.5)
Eosinophils Relative: 2 %
HCT: 39.5 % (ref 36.0–46.0)
Hemoglobin: 12.7 g/dL (ref 12.0–15.0)
Immature Granulocytes: 0 %
Lymphocytes Relative: 18 %
Lymphs Abs: 2.6 10*3/uL (ref 0.7–4.0)
MCH: 29.1 pg (ref 26.0–34.0)
MCHC: 32.2 g/dL (ref 30.0–36.0)
MCV: 90.4 fL (ref 80.0–100.0)
Monocytes Absolute: 1.5 10*3/uL — ABNORMAL HIGH (ref 0.1–1.0)
Monocytes Relative: 10 %
Neutro Abs: 10.2 10*3/uL — ABNORMAL HIGH (ref 1.7–7.7)
Neutrophils Relative %: 70 %
Platelets: 373 10*3/uL (ref 150–400)
RBC: 4.37 MIL/uL (ref 3.87–5.11)
RDW: 13.9 % (ref 11.5–15.5)
WBC: 14.7 10*3/uL — ABNORMAL HIGH (ref 4.0–10.5)
nRBC: 0 % (ref 0.0–0.2)

## 2018-10-13 LAB — COMPREHENSIVE METABOLIC PANEL
ALT: 19 U/L (ref 0–44)
AST: 19 U/L (ref 15–41)
Albumin: 3.9 g/dL (ref 3.5–5.0)
Alkaline Phosphatase: 100 U/L (ref 38–126)
Anion gap: 14 (ref 5–15)
BUN: 24 mg/dL — ABNORMAL HIGH (ref 8–23)
CO2: 23 mmol/L (ref 22–32)
Calcium: 9.4 mg/dL (ref 8.9–10.3)
Chloride: 104 mmol/L (ref 98–111)
Creatinine, Ser: 1.45 mg/dL — ABNORMAL HIGH (ref 0.44–1.00)
GFR calc Af Amer: 42 mL/min — ABNORMAL LOW (ref >60)
GFR calc non Af Amer: 36 mL/min — ABNORMAL LOW (ref >60)
Glucose, Bld: 88 mg/dL (ref 70–99)
Potassium: 4.4 mmol/L (ref 3.5–5.1)
Sodium: 141 mmol/L (ref 135–145)
Total Bilirubin: 0.6 mg/dL (ref 0.3–1.2)
Total Protein: 7.3 g/dL (ref 6.5–8.1)

## 2018-10-13 MED ORDER — FAMOTIDINE IN NACL 20-0.9 MG/50ML-% IV SOLN
20.0000 mg | Freq: Once | INTRAVENOUS | Status: AC
  Administered 2018-10-13: 20 mg via INTRAVENOUS
  Filled 2018-10-13: qty 50

## 2018-10-13 MED ORDER — DEXAMETHASONE SODIUM PHOSPHATE 10 MG/ML IJ SOLN
10.0000 mg | Freq: Once | INTRAMUSCULAR | Status: AC
  Administered 2018-10-13: 21:00:00 10 mg via INTRAVENOUS
  Filled 2018-10-13: qty 1

## 2018-10-13 MED ORDER — EPINEPHRINE 0.3 MG/0.3ML IJ SOAJ: 0.3000 mg | Freq: Once | INTRAMUSCULAR | Status: DC

## 2018-10-13 MED ORDER — SODIUM CHLORIDE 0.9 % IV BOLUS
500.0000 mL | Freq: Once | INTRAVENOUS | Status: AC
  Administered 2018-10-13: 21:00:00 500 mL via INTRAVENOUS

## 2018-10-13 MED ORDER — EPINEPHRINE 0.3 MG/0.3ML IJ SOAJ
0.3000 mg | Freq: Once | INTRAMUSCULAR | Status: AC
  Administered 2018-10-13: 0.3 mg via INTRAMUSCULAR
  Filled 2018-10-13: qty 0.3

## 2018-10-13 NOTE — ED Notes (Signed)
Per RN [place] blood work has already been gotten

## 2018-10-13 NOTE — ED Triage Notes (Signed)
Patient with tongue swelling and having a hard time swallowing her saliva.  She is not having any shortness of breath at this time.  No hives.  She states that she use to take Lisinopril and had this same reaction about 3-4 years ago.  She does not take Lisinopril any longer.

## 2018-10-13 NOTE — ED Provider Notes (Signed)
[facility] EMERGENCY DEPARTMENT Provider Note   CSN: [id number] Arrival date & time: 10/13/18  1955    History   Chief Complaint Chief Complaint  Patient presents with   Allergic Reaction    HPI Tracy Williams is a 73 y.o. female.     Patient with history of angioedema from likely lisinopril, diabetes, anemia presents with worsening tongue swelling and difficulty swallowing since 30 minutes after eating [place] food.  Patient denies any other new medications or other exposures.  No shortness of breath.  Patient is not on any blood pressure medications.     Past Medical History:  Diagnosis Date   Allergy    Anemia    hx   Arthritis    Diabetes mellitus without complication ([place])    Type II   Dysrhythmia    Hx: of palpitations in 2010 only   GERD (gastroesophageal reflux disease)    Hypertension    Pancreatic mass    /notes 02/25/2016   PONV (postoperative nausea and vomiting)    pt reports no PONV after biopsy on 12/24/15   Right knee DJD    Stress incontinence    Urinary frequency    Urinary urgency    Wears glasses     Patient Active Problem List   Diagnosis Date Noted   Angio-edema 01/19/2017   Shingles 11/14/2015   Pancreatic mass 11/09/2015   Leukocytosis 11/09/2015   Bilateral low back pain without sciatica 10/03/2014   Right knee DJD 06/27/2012   Osteoporosis, post-menopausal 06/09/2012   Type 2 diabetes mellitus with complication, with long-term current use of insulin ([place]) 06/09/2012   General medical examination 11/26/2010   HTN (hypertension) 11/26/2010   Colon cancer screening 11/26/2010   Hyperlipidemia 11/26/2010    Past Surgical History:  Procedure Laterality Date   CESAREAN SECTION  1975   COLONOSCOPY W/ BIOPSIES AND POLYPECTOMY     Hx: of in 2012   [place]     Hx: of 1978   EUS N/A 12/24/2015   Procedure: UPPER ENDOSCOPIC ULTRASOUND (EUS) RADIAL;   Surgeon: Arta Silence, MD;  [facility];  Service: Endoscopy;  Laterality: N/A;   FINE NEEDLE ASPIRATION N/A 12/24/2015   Procedure: FINE NEEDLE ASPIRATION (FNA) RADIAL;  Surgeon: Arta Silence, MD;  [facility];  Service: Endoscopy;  Laterality: N/A;   JOINT REPLACEMENT     Hx: of left Knee 2010   LAPAROSCOPIC DISTAL PANCREATECTOMY  02/25/2016   Diagnostic laparoscopy, laparoscopic hand-assisted    LAPAROSCOPIC SPLENECTOMY N/A 02/25/2016   Procedure: LAPAROSCOPIC SPLENECTOMY;  Surgeon: Stark Klein, MD;  Location: [place];  Service: General;  Laterality: N/A;   PANCREATECTOMY N/A 02/25/2016   Procedure: LAPAROSCOPIC DISTAL PANCREATECTOMY;  Surgeon: Stark Klein, MD;  Location: [place];  Service: General;  Laterality: N/A;   SPLENECTOMY  02/25/2016   TOTAL KNEE ARTHROPLASTY  2010   TOTAL KNEE ARTHROPLASTY Right 06/26/2012   Dr Noemi Chapel   TOTAL KNEE ARTHROPLASTY Right 06/26/2012   Procedure: TOTAL KNEE ARTHROPLASTY- right;  Surgeon: Lorn Junes, MD;  Location: [place];  Service: Orthopedics;  Laterality: Right;     OB History   No obstetric history on file.      Home Medications    Prior to Admission medications   Medication Sig Start Date End Date Taking? Authorizing Provider  atorvastatin (LIPITOR) 40 MG tablet TAKE 1 TABLET BY MOUTH EVERY DAY Patient taking differently: Take 40 mg by mouth daily at 6 PM.  06/23/18  Yes Elayne Snare, MD  Cholecalciferol (VITAMIN D3) 2000 units TABS Take 2,000 Units by mouth daily.    Yes [provider]  diphenhydrAMINE (BENADRYL) 25 mg capsule Take 25-50 mg by mouth as needed (for allergic reactions).   Yes [provider]  glipiZIDE (GLUCOTROL XL) 5 MG 24 hr tablet TAKE 1 TABLET BY MOUTH EVERY DAY WITH BREAKFAST Patient taking differently: Take 5 mg by mouth daily with breakfast.  08/08/18  Yes Midge Minium, MD  ibuprofen (ADVIL,MOTRIN) 200 MG tablet Take 200 mg by mouth every 8 (eight) hours as  needed for headache or mild pain.    Yes [provider]  JANUMET 50-500 MG tablet TAKE 1 TABLET BY MOUTH 2 (TWO) TIMES DAILY WITH A MEAL. Patient taking differently: Take 1 tablet by mouth daily with supper.  08/23/17  Yes Midge Minium, MD  ACCU-CHEK SOFTCLIX LANCETS lancets Use as instructed to test sugars 2-3 times daily. E11.9 05/17/18   Midge Minium, MD  Alcohol Swabs (B-D SINGLE USE SWABS REGULAR) PADS USE ONE SWAB EACH TIME SUGARS ARE TESTED AS DIRECTED 07/17/18   Midge Minium, MD  Blood Glucose Monitoring Suppl (ACCU-CHEK GUIDE) w/Device KIT 1 each by Does not apply route 3 (three) times daily. 05/17/18   Midge Minium, MD  glucose blood (ACCU-CHEK GUIDE) test strip Use as instructed 05/17/18   Midge Minium, MD    Family History Family History  Problem Relation Age of Onset   Diabetes Mellitus II Father    Hypertension Father    Cancer - Other Mother        Breast    Social History Social History   Tobacco Use   Smoking status: Never Smoker   Smokeless tobacco: Never Used  Substance Use Topics   Alcohol use: No   Drug use: No     Allergies   Lisinopril, Metformin and related, Darvocet [propoxyphene n-acetaminophen], Demerol [meperidine], Hydrochlorothiazide, Percocet [oxycodone-acetaminophen], and Vicodin [hydrocodone-acetaminophen]   Review of Systems Review of Systems  Constitutional: Negative for chills and fever.  HENT: Positive for trouble swallowing. Negative for congestion.   Eyes: Negative for visual disturbance.  Respiratory: Negative for shortness of breath.   Cardiovascular: Negative for chest pain.  Gastrointestinal: Negative for abdominal pain and vomiting.  Genitourinary: Negative for dysuria and flank pain.  Musculoskeletal: Negative for back pain, neck pain and neck stiffness.  Skin: Negative for rash.  Neurological: Negative for light-headedness and headaches.     Physical Exam Updated Vital  Signs BP (!) 160/69    Pulse 85    Temp 98.4 F (36.9 C) (Oral)    Resp 19    Ht '5\' 5"'$  (1.651 m)    Wt 72.6 kg    SpO2 100%    BMI 26.63 kg/m   Physical Exam Vitals signs and nursing note reviewed.  Constitutional:      Appearance: She is well-developed.  HENT:     Head: Normocephalic and atraumatic.     Comments: Patient has mild anterior cervical adenopathy, tongue swelling, no stridor, no breathing difficulty. Eyes:     General:        Right eye: No discharge.        Left eye: No discharge.     Conjunctiva/sclera: Conjunctivae normal.  Neck:     Musculoskeletal: Normal range of motion and neck supple.     Trachea: No tracheal deviation.  Cardiovascular:     Rate and Rhythm: Normal rate and  regular rhythm.  Pulmonary:     Effort: Pulmonary effort is normal.     Breath sounds: Normal breath sounds.  Abdominal:     General: There is no distension.     Palpations: Abdomen is soft.     Tenderness: There is no abdominal tenderness. There is no guarding.  Skin:    General: Skin is warm.     Findings: No rash.  Neurological:     Mental Status: She is alert and oriented to person, place, and time.      ED Treatments / Results  Labs (all labs ordered are listed, but only abnormal results are displayed) Labs Reviewed  CBC WITH DIFFERENTIAL/PLATELET  COMPREHENSIVE METABOLIC PANEL    EKG None  Radiology No results found.  Procedures .Critical Care Performed by: Elnora Morrison, MD Authorized by: Elnora Morrison, MD   Critical care provider statement:    Critical care time (minutes):  75   Critical care start time:  10/13/2018 9:25 PM   Critical care end time:  10/13/2018 10:40 PM   Critical care time was exclusive of:  Separately billable procedures and treating other patients and teaching time   Critical care was time spent personally by me on the following activities:  Discussions with consultants, evaluation of patient's response to treatment, examination of  patient, ordering and performing treatments and interventions, ordering and review of laboratory studies, ordering and review of radiographic studies, pulse oximetry, re-evaluation of patient's condition, obtaining history from patient or surrogate and review of old charts Comments:     anaphylaxis   (including critical care time)  Medications Ordered in ED Medications  EPINEPHrine (EPI-PEN) injection 0.3 mg (0 mg Intramuscular Hold 10/13/18 2209)  EPINEPHrine (EPI-PEN) injection 0.3 mg (0.3 mg Intramuscular Given 10/13/18 2051)  dexamethasone (DECADRON) injection 10 mg (10 mg Intravenous Given 10/13/18 2053)  famotidine (PEPCID) IVPB 20 mg premix (0 mg Intravenous Stopped 10/13/18 2159)  sodium chloride 0.9 % bolus 500 mL (0 mLs Intravenous Stopped 10/13/18 2209)     Initial Impression / Assessment and Plan / ED Course  I have reviewed the triage vital signs and the nursing notes.  Pertinent labs & imaging results that were available during my care of the patient were reviewed by me and considered in my medical decision making (see chart for details).       Patient presents with angioedema started at 5:30 PM likely from [place].  Patient is not on any ACE inhibitors.  Discussed steroids, Pepcid, IV fluids, EpiPen given in the ER.  Patient did not notice any significant change recommended repeat EpiPen however patient wants to hold at this time as she is not getting worse. Patient be observed closely in the ER, mild improvement on reassessment.  Discussed continued monitoring until swelling resolved especially being late at night. Patient's care will be signed out to continue to monitor and reassess until she is ready for discharge.     Final Clinical Impressions(s) / ED Diagnoses   Final diagnoses:  Angioedema, initial encounter  Allergic reaction, initial encounter    ED Discharge Orders    None       Elnora Morrison, MD 10/13/18 2242

## 2018-10-14 ENCOUNTER — Encounter: Payer: Self-pay | Admitting: Family Medicine

## 2018-10-14 DIAGNOSIS — T7840XA Allergy, unspecified, initial encounter: Secondary | ICD-10-CM

## 2018-10-14 MED ORDER — EPINEPHRINE 0.3 MG/0.3ML IJ SOAJ: 0.3000 mg | INTRAMUSCULAR | 2 refills | Status: DC | PRN

## 2018-10-14 NOTE — ED Notes (Signed)
Patient verbalizes understanding of discharge instructions. Opportunity for questioning and answers were provided. Armband removed by staff, pt discharged from ED.  

## 2018-10-14 NOTE — Discharge Instructions (Signed)
You may use Benadryl 50 mg every 6 hours as needed.  This medication is found over-the-counter.

## 2018-10-14 NOTE — ED Provider Notes (Addendum)
12:00 AM  Assumed care from Dr. Reather Converse.  Patient is a 73 year old female who presents to the emergency department after possible allergic reaction.  Patient presented with angioedema.  Improving after epi, Decadron, Pepcid.  Will monitor until 2 AM.  2:25 AM  Patient reports feeling much better.  Angioedema almost completely resolved.  He has a normal voice.  Her lungs are clear.  No shortness of breath, difficulty swallowing or speaking.  No hypotension.  States she thinks this was after eating [place] food.  Discussed return precautions.  Instructed patient on how to use EpiPen and when to use it.  Will give allergy specialist follow-up.  Will discharge with EpiPen prescription.  She has Benadryl at home.  Was given one-time dose of Decadron here.  Will hold on further steroids as she is a diabetic.  She agrees with this plan.   At this time, I do not feel there is any life-threatening condition present. I have reviewed and discussed all results (EKG, imaging, lab, urine as appropriate) and exam findings with patient/family. I have reviewed nursing notes and appropriate previous records.  I feel the patient is safe to be discharged home without further emergent workup and can continue workup as an outpatient as needed. Discussed usual and customary return precautions. Patient/family verbalize understanding and are comfortable with this plan.  Outpatient follow-up has been provided as needed. All questions have been answered.    Remy Voiles, Delice Bison, DO 10/14/18 0229    Buna Cuppett, Delice Bison, DO 10/14/18 0230

## 2018-10-14 NOTE — ED Notes (Signed)
Pt called out stating "I'm back to normal and I'm ready to go home" Told patient I would let the doctor know

## 2018-11-06 ENCOUNTER — Ambulatory Visit: Payer: Medicare HMO | Admitting: Allergy and Immunology

## 2018-11-07 ENCOUNTER — Other Ambulatory Visit: Payer: Self-pay | Admitting: Family Medicine

## 2018-11-14 ENCOUNTER — Encounter: Payer: Self-pay | Admitting: Family Medicine

## 2018-11-14 DIAGNOSIS — T7840XA Allergy, unspecified, initial encounter: Secondary | ICD-10-CM

## 2018-11-30 ENCOUNTER — Encounter: Payer: Self-pay | Admitting: Family Medicine

## 2018-12-01 ENCOUNTER — Encounter: Payer: Self-pay | Admitting: Emergency Medicine

## 2018-12-01 DIAGNOSIS — H52203 Unspecified astigmatism, bilateral: Secondary | ICD-10-CM | POA: Diagnosis not present

## 2018-12-01 DIAGNOSIS — E119 Type 2 diabetes mellitus without complications: Secondary | ICD-10-CM | POA: Diagnosis not present

## 2018-12-01 DIAGNOSIS — H5203 Hypermetropia, bilateral: Secondary | ICD-10-CM | POA: Diagnosis not present

## 2018-12-01 LAB — HM DIABETES EYE EXAM

## 2018-12-04 ENCOUNTER — Encounter: Payer: Self-pay | Admitting: Allergy and Immunology

## 2018-12-04 ENCOUNTER — Other Ambulatory Visit: Payer: Self-pay | Admitting: Family Medicine

## 2018-12-04 ENCOUNTER — Ambulatory Visit: Payer: Medicare HMO | Admitting: Allergy and Immunology

## 2018-12-04 ENCOUNTER — Other Ambulatory Visit: Payer: Self-pay

## 2018-12-04 VITALS — BP 136/70 | HR 92 | Temp 97.6°F | Resp 16 | Ht 64.0 in | Wt 160.0 lb

## 2018-12-04 DIAGNOSIS — T783XXD Angioneurotic edema, subsequent encounter: Secondary | ICD-10-CM

## 2018-12-04 DIAGNOSIS — H1010 Acute atopic conjunctivitis, unspecified eye: Secondary | ICD-10-CM | POA: Insufficient documentation

## 2018-12-04 DIAGNOSIS — J3089 Other allergic rhinitis: Secondary | ICD-10-CM | POA: Diagnosis not present

## 2018-12-04 DIAGNOSIS — H1013 Acute atopic conjunctivitis, bilateral: Secondary | ICD-10-CM | POA: Diagnosis not present

## 2018-12-04 DIAGNOSIS — T7840XD Allergy, unspecified, subsequent encounter: Secondary | ICD-10-CM

## 2018-12-04 DIAGNOSIS — T7840XA Allergy, unspecified, initial encounter: Secondary | ICD-10-CM | POA: Insufficient documentation

## 2018-12-04 DIAGNOSIS — Z1231 Encounter for screening mammogram for malignant neoplasm of breast: Secondary | ICD-10-CM

## 2018-12-04 MED ORDER — FEXOFENADINE HCL 180 MG PO TABS: 180.0000 mg | ORAL_TABLET | Freq: Every day | ORAL | 5 refills | Status: DC | PRN

## 2018-12-04 MED ORDER — FLUTICASONE PROPIONATE 50 MCG/ACT NA SUSP: 2.0000 | Freq: Every day | NASAL | 5 refills | Status: DC | PRN

## 2018-12-04 NOTE — Patient Instructions (Addendum)
Angioedema Unclear etiology.  Food allergen skin testing was negative today despite a positive histamine control.  The patient is not taking an ACE inhibitor.  She had been on an ACE inhibitor during an episode of angioedema and 2018, however had had episodes of angioedema prior to starting the ACE inhibitor and had an episode of angioedema approximately 2 years after having discontinued the ACE inhibitor. The patient denies urticaria. Will order labs to rule out hereditary angioedema, acquired angioedema, urticaria associated angioedema, and other potential etiologies. For symptom relief, patient is to take oral antihistamines as directed. However, if the underlying pathophysiology is bradykinin mediated, antihistamines will not reduce symptoms.  The following labs have been ordered: Tryptase, C4, C1 esterase inhibitor (quantitative and functional), C1q, factor XII, CBC, CMP, and galactose-alpha-1,3-galactose IgE level.  The patient will be notified with further recommendations after lab results have returned.  A prescription has been provided for fexofenadine 180 mg daily if needed.  Should symptoms recur, a  journal is to be kept recording any foods eaten, beverages consumed, medications taken, activities performed, and environmental conditions within a 6 hour period prior to the onset of symptoms. For any symptoms concerning for anaphylaxis, epinephrine is to be administered and 911 is to be called immediately.  The patient apparently has a prescription for epinephrine at the pharmacy and has been encouraged to pick this prescription up.  Perennial and seasonal allergic rhinitis  Aeroallergen avoidance measures have been discussed and provided in written form.  Fexofenadine/Allegra (as above).  A prescription has been provided for fluticasone nasal spray, 2 sprays per nostril daily as needed. Proper nasal spray technique has been discussed and demonstrated.  Nasal saline spray (i.e., Simply  Saline) or nasal saline lavage (i.e., NeilMed) is recommended as needed and prior to medicated nasal sprays.  Allergic conjunctivitis  Treatment plan as outlined above for allergic rhinitis.  A prescription has been provided for Pataday, one drop per eye daily as needed.  I have also recommended eye lubricant drops (i.e., Natural Tears) as needed.  When lab results have returned the patient will be called with further recommendations.  Control of House Dust Mite Allergen  House dust mites play a major role in allergic asthma and rhinitis.  They occur in environments with high humidity wherever human skin, the food for dust mites is found. High levels have been detected in dust obtained from mattresses, pillows, carpets, upholstered furniture, bed covers, clothes and soft toys.  The principal allergen of the house dust mite is found in its feces.  A gram of dust may contain 1,000 mites and 250,000 fecal particles.  Mite antigen is easily measured in the air during house cleaning activities.    1. Encase mattresses, including the box spring, and pillow, in an air tight cover.  Seal the zipper end of the encased mattresses with wide adhesive tape. 2. Wash the bedding in water of 130 degrees Farenheit weekly.  Avoid cotton comforters/quilts and flannel bedding: the most ideal bed covering is the dacron comforter. 3. Remove all upholstered furniture from the bedroom. 4. Remove carpets, carpet padding, rugs, and non-washable window drapes from the bedroom.  Wash drapes weekly or use plastic window coverings. 5. Remove all non-washable stuffed toys from the bedroom.  Wash stuffed toys weekly. 6. Have the room cleaned frequently with a vacuum cleaner and a damp dust-mop.  The patient should not be in a room which is being cleaned and should wait 1 hour after cleaning before going into the  room. 7. Close and seal all heating outlets in the bedroom.  Otherwise, the room will become filled with  dust-laden air.  An electric heater can be used to heat the room. 8. Reduce indoor humidity to less than 50%.  Do not use a humidifier.  Control of Dog or Cat Allergen  Avoidance is the best way to manage a dog or cat allergy. If you have a dog or cat and are allergic to dog or cats, consider removing the dog or cat from the home. If you have a dog or cat but don't want to find it a new home, or if your family wants a pet even though someone in the household is allergic, here are some strategies that may help keep symptoms at bay:  1. Keep the pet out of your bedroom and restrict it to only a few rooms. Be advised that keeping the dog or cat in only one room will not limit the allergens to that room. 2. Don't pet, hug or kiss the dog or cat; if you do, wash your hands with soap and water. 3. High-efficiency particulate air (HEPA) cleaners run continuously in a bedroom or living room can reduce allergen levels over time. 4. Place electrostatic material sheet in the air inlet vent in the bedroom. 5. Regular use of a high-efficiency vacuum cleaner or a central vacuum can reduce allergen levels. 6. Giving your dog or cat a bath at least once a week can reduce airborne allergen.  Reducing Pollen Exposure  The American Academy of Allergy, Asthma and Immunology suggests the following steps to reduce your exposure to pollen during allergy seasons.    1. Do not hang sheets or clothing out to dry; pollen may collect on these items. 2. Do not mow lawns or spend time around freshly cut grass; mowing stirs up pollen. 3. Keep windows closed at night.  Keep car windows closed while driving. 4. Minimize morning activities outdoors, a time when pollen counts are usually at their highest. 5. Stay indoors as much as possible when pollen counts or humidity is high and on windy days when pollen tends to remain in the air longer. 6. Use air conditioning when possible.  Many air conditioners have filters that trap  the pollen spores. 7. Use a HEPA room air filter to remove pollen form the indoor air you breathe.

## 2018-12-04 NOTE — Assessment & Plan Note (Signed)
   Aeroallergen avoidance measures have been discussed and provided in written form.  Fexofenadine/Allegra (as above).  A prescription has been provided for fluticasone nasal spray, 2 sprays per nostril daily as needed. Proper nasal spray technique has been discussed and demonstrated.  Nasal saline spray (i.e., Simply Saline) or nasal saline lavage (i.e., NeilMed) is recommended as needed and prior to medicated nasal sprays.

## 2018-12-04 NOTE — Assessment & Plan Note (Signed)
Unclear etiology.  Food allergen skin testing was negative today despite a positive histamine control.  The patient is not taking an ACE inhibitor.  She had been on an ACE inhibitor during an episode of angioedema and 2018, however had had episodes of angioedema prior to starting the ACE inhibitor and had an episode of angioedema approximately 2 years after having discontinued the ACE inhibitor. The patient denies urticaria. Will order labs to rule out hereditary angioedema, acquired angioedema, urticaria associated angioedema, and other potential etiologies. For symptom relief, patient is to take oral antihistamines as directed. However, if the underlying pathophysiology is bradykinin mediated, antihistamines will not reduce symptoms.  The following labs have been ordered: Tryptase, C4, C1 esterase inhibitor (quantitative and functional), C1q, factor XII, CBC, CMP, and galactose-alpha-1,3-galactose IgE level.  The patient will be notified with further recommendations after lab results have returned.  A prescription has been provided for fexofenadine 180 mg daily if needed.  Should symptoms recur, a  journal is to be kept recording any foods eaten, beverages consumed, medications taken, activities performed, and environmental conditions within a 6 hour period prior to the onset of symptoms. For any symptoms concerning for anaphylaxis, epinephrine is to be administered and 911 is to be called immediately.  The patient apparently has a prescription for epinephrine at the pharmacy and has been encouraged to pick this prescription up.

## 2018-12-04 NOTE — Progress Notes (Signed)
New Patient Note  RE: Tracy Williams MRN: [id number] DOB: 30-May-1945 Date of Office Visit: 12/04/2018  Referring provider: Midge Minium, MD Primary care provider: Midge Minium, MD  Chief Complaint: Allergic Reaction and Angioedema   History of present illness: Tracy Williams is a 73 y.o. female seen today in consultation requested by [name], MD.  She reports that over the past 30 years she has had 6 episodes of tongue swelling, 3 of those episodes of occurred in the past 4 years.  Most recent episode occurred in the third week of July after having consumed a meal at a [employer].  The meal consisted of chicken and vegetables, all of these foods she consumes on a regular basis without symptoms.  She does not believe that she has consume these foods prior to the onset of previous episodes of angioedema.  She went to the emergency department and was treated with epinephrine, Decadron, and antihistamines with resolution of symptoms during the observation.  In October 2018 she went to the emergency department for tongue angioedema.  She had been on lisinopril at that time and this medication was discontinued.  However, she had had 4 episodes of angioedema prior to being started on lisinopril and has had an episode of angioedema approximately 2 years after discontinuation of the ACE inhibitor.  She has no known family members with a history of angioedema, however she did not know her father or her father side of the family.  During the episodes of tongue angioedema she does not experience change in voice, generalized urticaria, cardiopulmonary symptoms, or other GI symptoms.  No specific medication, food, skin care product, detergent, soap, or other environmental triggers have been identified.  She has been prescribed epinephrine autoinjectors, however did not pick up this prescription from the pharmacy. Merina experiences nasal congestion, rhinorrhea, sneezing,  postnasal drainage, and ocular pruritus.  These symptoms are most prominent during the springtime and in the fall and also when she is exposed to dogs and cats.  She endures these symptoms without taking medications.  Assessment and plan: Angioedema Unclear etiology.  Food allergen skin testing was negative today despite a positive histamine control.  The patient is not taking an ACE inhibitor.  She had been on an ACE inhibitor during an episode of angioedema and 2018, however had had episodes of angioedema prior to starting the ACE inhibitor and had an episode of angioedema approximately 2 years after having discontinued the ACE inhibitor. The patient denies urticaria. Will order labs to rule out hereditary angioedema, acquired angioedema, urticaria associated angioedema, and other potential etiologies. For symptom relief, patient is to take oral antihistamines as directed. However, if the underlying pathophysiology is bradykinin mediated, antihistamines will not reduce symptoms.  The following labs have been ordered: Tryptase, C4, C1 esterase inhibitor (quantitative and functional), C1q, factor XII, CBC, CMP, and galactose-alpha-1,3-galactose IgE level.  The patient will be notified with further recommendations after lab results have returned.  A prescription has been provided for fexofenadine 180 mg daily if needed.  Should symptoms recur, a  journal is to be kept recording any foods eaten, beverages consumed, medications taken, activities performed, and environmental conditions within a 6 hour period prior to the onset of symptoms. For any symptoms concerning for anaphylaxis, epinephrine is to be administered and 911 is to be called immediately.  The patient apparently has a prescription for epinephrine at the pharmacy and has been encouraged to pick this prescription up.  Perennial and seasonal  allergic rhinitis  Aeroallergen avoidance measures have been discussed and provided in written form.   Fexofenadine/Allegra (as above).  A prescription has been provided for fluticasone nasal spray, 2 sprays per nostril daily as needed. Proper nasal spray technique has been discussed and demonstrated.  Nasal saline spray (i.e., Simply Saline) or nasal saline lavage (i.e., NeilMed) is recommended as needed and prior to medicated nasal sprays.  Allergic conjunctivitis  Treatment plan as outlined above for allergic rhinitis.  A prescription has been provided for Pataday, one drop per eye daily as needed.  I have also recommended eye lubricant drops (i.e., Natural Tears) as needed.   Meds ordered this encounter  Medications  . fexofenadine (ALLEGRA) 180 MG tablet    Sig: Take 1 tablet (180 mg total) by mouth daily as needed for allergies or rhinitis.    Dispense:  30 tablet    Refill:  5  . fluticasone (FLONASE) 50 MCG/ACT nasal spray    Sig: Place 2 sprays into both nostrils daily as needed for allergies or rhinitis.    Dispense:  16 g    Refill:  5    Diagnostics: Environmental skin testing: Positive to grass pollen, cat hair, dog epithelia, and dust mite antigen. Food allergen skin testing: Negative despite a positive histamine control.    Physical examination: Blood pressure 136/70, pulse 92, temperature 97.6 F (36.4 C), temperature source Temporal, resp. rate 16, height '5\' 4"'$  (1.626 m), weight 160 lb (72.6 kg), SpO2 96 %.  General: Alert, interactive, in no acute distress. HEENT: TMs pearly gray, turbinates mildly edematous without discharge, post-pharynx mildly erythematous. Neck: Supple without lymphadenopathy. Lungs: Clear to auscultation without wheezing, rhonchi or rales. CV: Normal S1, S2 without murmurs. Abdomen: Nondistended, nontender. Skin: Warm and dry, without lesions or rashes. Extremities:  No clubbing, cyanosis or edema. Neuro:   Grossly intact.  Review of systems:  Review of systems negative except as noted in HPI / PMHx or noted below: Review of  Systems  Constitutional: Negative.   HENT: Negative.   Eyes: Negative.   Respiratory: Negative.   Cardiovascular: Negative.   Gastrointestinal: Negative.   Genitourinary: Negative.   Musculoskeletal: Negative.   Skin: Negative.   Neurological: Negative.   Endo/Heme/Allergies: Negative.   Psychiatric/Behavioral: Negative.     Past medical history:  Past Medical History:  Diagnosis Date  . Allergy   . Anemia    hx  . Angio-edema   . Arthritis   . Diabetes mellitus without complication ([place])    Type II  . Dysrhythmia    Hx: of palpitations in 2010 only  . GERD (gastroesophageal reflux disease)   . Hypertension   . Pancreatic mass    [facility] 02/25/2016  . PONV (postoperative nausea and vomiting)    pt reports no PONV after biopsy on 12/24/15  . Right knee DJD   . Stress incontinence   . Urinary frequency   . Urinary urgency   . Wears glasses     Past surgical history:  Past Surgical History:  Procedure Laterality Date  . CESAREAN SECTION  1975  . COLONOSCOPY W/ BIOPSIES AND POLYPECTOMY     Hx: of in 2012  . DILATION AND CURETTAGE OF UTERUS     Hx: of 1978  . EUS N/A 12/24/2015   Procedure: UPPER ENDOSCOPIC ULTRASOUND (EUS) RADIAL;  Surgeon: Arta Silence, MD;  Location: [facility];  Service: Endoscopy;  Laterality: N/A;  . FINE NEEDLE ASPIRATION N/A 12/24/2015   Procedure: FINE NEEDLE ASPIRATION (FNA)  RADIAL;  Surgeon: Arta Silence, MD;  Location: [facility];  Service: Endoscopy;  Laterality: N/A;  . JOINT REPLACEMENT     Hx: of left Knee 2010  . LAPAROSCOPIC DISTAL PANCREATECTOMY  02/25/2016   Diagnostic laparoscopy, laparoscopic hand-assisted   . LAPAROSCOPIC SPLENECTOMY N/A 02/25/2016   Procedure: LAPAROSCOPIC SPLENECTOMY;  Surgeon: Stark Klein, MD;  Location: [facility];  Service: General;  Laterality: N/A;  . PANCREATECTOMY N/A 02/25/2016   Procedure: LAPAROSCOPIC DISTAL PANCREATECTOMY;  Surgeon: Stark Klein, MD;  Location: [facility];  Service: General;   Laterality: N/A;  . SPLENECTOMY  02/25/2016  . TOTAL KNEE ARTHROPLASTY  2010  . TOTAL KNEE ARTHROPLASTY Right 06/26/2012   Dr Noemi Chapel  . TOTAL KNEE ARTHROPLASTY Right 06/26/2012   Procedure: TOTAL KNEE ARTHROPLASTY- right;  Surgeon: Lorn Junes, MD;  Location: [facility];  Service: Orthopedics;  Laterality: Right;    Family history: Family History  Problem Relation Age of Onset  . Diabetes Mellitus II Father   . Hypertension Father   . Cancer - Other Mother        Breast    Social history: Social History   Socioeconomic History  . Marital status: Widowed    Spouse name: Not on file  . Number of children: Not on file  . Years of education: Not on file  . Highest education level: Not on file  Occupational History  . Not on file  Social Needs  . Financial resource strain: Not on file  . Food insecurity    Worry: Not on file    Inability: Not on file  . Transportation needs    Medical: Not on file    Non-medical: Not on file  Tobacco Use  . Smoking status: Never Smoker  . Smokeless tobacco: Never Used  Substance and Sexual Activity  . Alcohol use: No  . Drug use: No  . Sexual activity: Not on file  Lifestyle  . Physical activity    Days per week: Not on file    Minutes per session: Not on file  . Stress: Not on file  Relationships  . Social Herbalist on phone: Not on file    Gets together: Not on file    Attends religious service: Not on file    Active member of club or organization: Not on file    Attends meetings of clubs or organizations: Not on file    Relationship status: Not on file  . Intimate partner violence    Fear of current or ex partner: Not on file    Emotionally abused: Not on file    Physically abused: Not on file    Forced sexual activity: Not on file  Other Topics Concern  . Not on file  Social History Narrative  . Not on file   Environmental History: The patient lives in a 73 year old house with hardwood floors throughout, oil  heat, and central air.  There is a dog and a cat in the house which have access to her bedroom.  There is no known mold/water damage in the home.  She is a non-smoker.  Allergies as of 12/04/2018      Reactions   Lisinopril Swelling   Caused throat and tongue to swell (breathing was not affected, however)   Metformin And Related Nausea And Vomiting   Darvocet [propoxyphene N-acetaminophen] Nausea And Vomiting   Demerol [meperidine] Nausea And Vomiting   Hydrochlorothiazide Nausea And Vomiting   Percocet [oxycodone-acetaminophen] Nausea And Vomiting  Vicodin [hydrocodone-acetaminophen] Nausea And Vomiting      Medication List       Accurate as of December 04, 2018 10:24 AM. If you have any questions, ask your nurse or doctor.        STOP taking these medications   ibuprofen 200 MG tablet Commonly known as: ADVIL Stopped by: Edmonia Lynch, MD     TAKE these medications   Accu-Chek Guide w/Device Kit 1 each by Does not apply route 3 (three) times daily.   Accu-Chek Softclix Lancets lancets Use as instructed to test sugars 2-3 times daily. E11.9   atorvastatin 40 MG tablet Commonly known as: LIPITOR TAKE 1 TABLET BY MOUTH EVERY DAY What changed: when to take this   B-D SINGLE USE SWABS REGULAR Pads USE ONE SWAB EACH TIME SUGARS ARE TESTED AS DIRECTED   diphenhydrAMINE 25 mg capsule Commonly known as: BENADRYL Take 25-50 mg by mouth as needed (for allergic reactions).   EPINEPHrine 0.3 mg/0.3 mL Soaj injection Commonly known as: EPI-PEN Inject 0.3 mLs (0.3 mg total) into the muscle as needed for anaphylaxis.   fexofenadine 180 MG tablet Commonly known as: ALLEGRA Take 1 tablet (180 mg total) by mouth daily as needed for allergies or rhinitis. Started by: Edmonia Lynch, MD   fluticasone 50 MCG/ACT nasal spray Commonly known as: FLONASE Place 2 sprays into both nostrils daily as needed for allergies or rhinitis. Started by: Edmonia Lynch, MD   glipiZIDE  5 MG 24 hr tablet Commonly known as: GLUCOTROL XL TAKE 1 TABLET BY MOUTH EVERY DAY WITH BREAKFAST   glucose blood test strip Commonly known as: Accu-Chek Guide Use as instructed   Janumet 50-500 MG tablet Generic drug: sitaGLIPtin-metformin TAKE 1 TABLET BY MOUTH 2 (TWO) TIMES DAILY WITH A MEAL. What changed: See the new instructions.   Vitamin D3 50 MCG (2000 UT) Tabs Take 2,000 Units by mouth daily.       Known medication allergies: Allergies  Allergen Reactions  . Lisinopril Swelling    Caused throat and tongue to swell (breathing was not affected, however)  . Metformin And Related Nausea And Vomiting  . Darvocet [Propoxyphene N-Acetaminophen] Nausea And Vomiting  . Demerol [Meperidine] Nausea And Vomiting  . Hydrochlorothiazide Nausea And Vomiting  . Percocet [Oxycodone-Acetaminophen] Nausea And Vomiting  . Vicodin [Hydrocodone-Acetaminophen] Nausea And Vomiting    I appreciate the opportunity to take part in [facility] care. Please do not hesitate to contact me with questions.  Sincerely,   R. Edgar Frisk, MD

## 2018-12-04 NOTE — Assessment & Plan Note (Signed)
   Treatment plan as outlined above for allergic rhinitis.  A prescription has been provided for Pataday, one drop per eye daily as needed.  I have also recommended eye lubricant drops (i.e., Natural Tears) as needed. 

## 2018-12-13 LAB — COMPREHENSIVE METABOLIC PANEL
ALT: 11 IU/L (ref 0–32)
AST: 17 IU/L (ref 0–40)
Albumin/Globulin Ratio: 1.7 (ref 1.2–2.2)
Albumin: 4.3 g/dL (ref 3.7–4.7)
Alkaline Phosphatase: 111 IU/L (ref 39–117)
BUN/Creatinine Ratio: 20 (ref 12–28)
BUN: 25 mg/dL (ref 8–27)
Bilirubin Total: 0.6 mg/dL (ref 0.0–1.2)
CO2: 26 mmol/L (ref 20–29)
Calcium: 9.7 mg/dL (ref 8.7–10.3)
Chloride: 103 mmol/L (ref 96–106)
Creatinine, Ser: 1.22 mg/dL — ABNORMAL HIGH (ref 0.57–1.00)
GFR calc Af Amer: 51 mL/min/{1.73_m2} — ABNORMAL LOW (ref >59)
GFR calc non Af Amer: 44 mL/min/{1.73_m2} — ABNORMAL LOW (ref >59)
Globulin, Total: 2.5 g/dL (ref 1.5–4.5)
Glucose: 120 mg/dL — ABNORMAL HIGH (ref 65–99)
Potassium: 4.7 mmol/L (ref 3.5–5.2)
Sodium: 141 mmol/L (ref 134–144)
Total Protein: 6.8 g/dL (ref 6.0–8.5)

## 2018-12-13 LAB — ALPHA-GAL PANEL
Alpha Gal IgE*: 1.46 kU/L — ABNORMAL HIGH (ref <0.10)
Beef (Bos spp) IgE: 0.38 kU/L — ABNORMAL HIGH (ref <0.35)
Class Interpretation: 0
Class Interpretation: 1
Lamb/Mutton (Ovis spp) IgE: 0.15 kU/L (ref <0.35)
Pork (Sus spp) IgE: <0.1 kU/L (ref <0.35)

## 2018-12-13 LAB — CBC WITH DIFFERENTIAL/PLATELET
Basophils Absolute: 0.1 10*3/uL (ref 0.0–0.2)
Basos: 0 %
EOS (ABSOLUTE): 0.2 10*3/uL (ref 0.0–0.4)
Eos: 2 %
Hematocrit: 39.8 % (ref 34.0–46.6)
Hemoglobin: 12.9 g/dL (ref 11.1–15.9)
Immature Grans (Abs): 0 10*3/uL (ref 0.0–0.1)
Immature Granulocytes: 0 %
Lymphocytes Absolute: 1.5 10*3/uL (ref 0.7–3.1)
Lymphs: 13 %
MCH: 29.4 pg (ref 26.6–33.0)
MCHC: 32.4 g/dL (ref 31.5–35.7)
MCV: 91 fL (ref 79–97)
Monocytes Absolute: 0.9 10*3/uL (ref 0.1–0.9)
Monocytes: 8 %
Neutrophils Absolute: 8.9 10*3/uL — ABNORMAL HIGH (ref 1.4–7.0)
Neutrophils: 77 %
Platelets: 381 10*3/uL (ref 150–450)
RBC: 4.39 x10E6/uL (ref 3.77–5.28)
RDW: 13.5 % (ref 11.7–15.4)
WBC: 11.6 10*3/uL — ABNORMAL HIGH (ref 3.4–10.8)

## 2018-12-13 LAB — COMPLEMENT COMPONENT C1Q: Complement C1Q: 14.6 mg/dL (ref 10.3–20.5)

## 2018-12-13 LAB — TRYPTASE: Tryptase: 4.9 ug/L (ref 2.2–13.2)

## 2018-12-13 LAB — C1 ESTERASE INHIBITOR, FUNCTIONAL: C1INH Functional/C1INH Total MFr SerPl: >100 %mean normal

## 2018-12-13 LAB — C1 ESTERASE INHIBITOR: C1INH SerPl-mCnc: 44 mg/dL — ABNORMAL HIGH (ref 21–39)

## 2018-12-13 LAB — FACTOR 12 ASSAY: Factor XII Activity: 116 % (ref 50–150)

## 2018-12-13 LAB — C4 COMPLEMENT: Complement C4, Serum: 34 mg/dL (ref 14–44)

## 2019-01-03 ENCOUNTER — Encounter: Payer: Self-pay | Admitting: Family Medicine

## 2019-01-03 ENCOUNTER — Ambulatory Visit (INDEPENDENT_AMBULATORY_CARE_PROVIDER_SITE_OTHER): Payer: Medicare HMO | Admitting: Family Medicine

## 2019-01-03 ENCOUNTER — Other Ambulatory Visit: Payer: Self-pay

## 2019-01-03 VITALS — BP 123/70 | HR 64 | Temp 97.8°F | Resp 16 | Ht 64.0 in | Wt 161.0 lb

## 2019-01-03 DIAGNOSIS — Z0000 Encounter for general adult medical examination without abnormal findings: Secondary | ICD-10-CM | POA: Diagnosis not present

## 2019-01-03 DIAGNOSIS — Z794 Long term (current) use of insulin: Secondary | ICD-10-CM

## 2019-01-03 DIAGNOSIS — E785 Hyperlipidemia, unspecified: Secondary | ICD-10-CM

## 2019-01-03 DIAGNOSIS — M810 Age-related osteoporosis without current pathological fracture: Secondary | ICD-10-CM

## 2019-01-03 DIAGNOSIS — E118 Type 2 diabetes mellitus with unspecified complications: Secondary | ICD-10-CM

## 2019-01-03 DIAGNOSIS — Z23 Encounter for immunization: Secondary | ICD-10-CM

## 2019-01-03 LAB — HEPATIC FUNCTION PANEL
ALT: 13 U/L (ref 0–35)
AST: 13 U/L (ref 0–37)
Albumin: 4.2 g/dL (ref 3.5–5.2)
Alkaline Phosphatase: 90 U/L (ref 39–117)
Bilirubin, Direct: 0.1 mg/dL (ref 0.0–0.3)
Total Bilirubin: 0.7 mg/dL (ref 0.2–1.2)
Total Protein: 6.4 g/dL (ref 6.0–8.3)

## 2019-01-03 LAB — CBC WITH DIFFERENTIAL/PLATELET
Basophils Absolute: 0 10*3/uL (ref 0.0–0.1)
Basophils Relative: 0.3 % (ref 0.0–3.0)
Eosinophils Absolute: 0.4 10*3/uL (ref 0.0–0.7)
Eosinophils Relative: 3.6 % (ref 0.0–5.0)
HCT: 36.1 % (ref 36.0–46.0)
Hemoglobin: 11.8 g/dL — ABNORMAL LOW (ref 12.0–15.0)
Lymphocytes Relative: 18.7 % (ref 12.0–46.0)
Lymphs Abs: 2.1 10*3/uL (ref 0.7–4.0)
MCHC: 32.7 g/dL (ref 30.0–36.0)
MCV: 89.5 fl (ref 78.0–100.0)
Monocytes Absolute: 1.2 10*3/uL — ABNORMAL HIGH (ref 0.1–1.0)
Monocytes Relative: 10.6 % (ref 3.0–12.0)
Neutro Abs: 7.6 10*3/uL (ref 1.4–7.7)
Neutrophils Relative %: 66.8 % (ref 43.0–77.0)
Platelets: 367 10*3/uL (ref 150.0–400.0)
RBC: 4.04 Mil/uL (ref 3.87–5.11)
RDW: 14.2 % (ref 11.5–15.5)
WBC: 11.4 10*3/uL — ABNORMAL HIGH (ref 4.0–10.5)

## 2019-01-03 LAB — LIPID PANEL
Cholesterol: 179 mg/dL (ref 0–200)
HDL: 38.7 mg/dL — ABNORMAL LOW (ref >39.00)
LDL Cholesterol: 111 mg/dL — ABNORMAL HIGH (ref 0–99)
NonHDL: 139.88
Total CHOL/HDL Ratio: 5
Triglycerides: 143 mg/dL (ref 0.0–149.0)
VLDL: 28.6 mg/dL (ref 0.0–40.0)

## 2019-01-03 LAB — HEMOGLOBIN A1C: Hgb A1c MFr Bld: 6.8 % — ABNORMAL HIGH (ref 4.6–6.5)

## 2019-01-03 LAB — BASIC METABOLIC PANEL
BUN: 22 mg/dL (ref 6–23)
CO2: 28 mEq/L (ref 19–32)
Calcium: 9.4 mg/dL (ref 8.4–10.5)
Chloride: 104 mEq/L (ref 96–112)
Creatinine, Ser: 1.21 mg/dL — ABNORMAL HIGH (ref 0.40–1.20)
GFR: 43.61 mL/min — ABNORMAL LOW (ref >60.00)
Glucose, Bld: 109 mg/dL — ABNORMAL HIGH (ref 70–99)
Potassium: 4.7 mEq/L (ref 3.5–5.1)
Sodium: 140 mEq/L (ref 135–145)

## 2019-01-03 LAB — VITAMIN D 25 HYDROXY (VIT D DEFICIENCY, FRACTURES): VITD: 54.76 ng/mL (ref 30.00–100.00)

## 2019-01-03 LAB — TSH: TSH: 1.03 u[IU]/mL (ref 0.35–4.50)

## 2019-01-03 NOTE — Assessment & Plan Note (Signed)
Check Vit D and replete prn. 

## 2019-01-03 NOTE — Progress Notes (Signed)
   Subjective:    Patient ID: Tracy Williams, female    DOB: [birth year], 73 y.o.   MRN: [id number]  HPI Here today for CPE and AWV.  Risk Factors: DM- chronic problem, following w/ Dr Dwyane Dee.  UTD on eye exam, microalbumin.  Due for foot exam.  Denies symptomatic lows.  No numbness/tingling of hands/feet. Hyperlipidemia- chronic problem, on Lipitor 40mg  daily.  Denies abd pain, N/V Physical Activity: no regular exercise Fall Risk: no recent falls Depression: denies current sxs Hearing: normal to conversational tones and whispered voice ADL's: independent Cognitive: normal linear thought process, memory and attention intact Home Safety: safe at home, lives alone (widowed) Height, Weight, BMI, Visual Acuity: see vitals, vision corrected to 20/20 w/ glasses Counseling: UTD on eye exam, has mammo scheduled.  Due for foot exam.  UTD on colonoscopy Labs Ordered: See A&P Care Plan: See A&P    Review of Systems Patient reports no vision/ hearing changes, adenopathy,fever, weight change,  persistant/recurrent hoarseness , swallowing issues, chest pain, palpitations, edema, persistant/recurrent cough, hemoptysis, dyspnea (rest/exertional/paroxysmal nocturnal), gastrointestinal bleeding (melena, rectal bleeding), abdominal pain, significant heartburn, bowel changes, GU symptoms (dysuria, hematuria, incontinence), Gyn symptoms (abnormal  bleeding, pain),  syncope, focal weakness, memory loss, numbness & tingling, skin/hair/nail changes, abnormal bruising or bleeding, anxiety, or depression.     Objective:   Physical Exam General Appearance:    Alert, cooperative, no distress, appears stated age  Head:    Normocephalic, without obvious abnormality, atraumatic  Eyes:    PERRL, conjunctiva/corneas clear, EOM's intact, fundi    benign, both eyes  Ears:    Normal TM's and external ear canals, both ears  Nose:   Deferred due to COVID  Throat:   Neck:   Supple, symmetrical, trachea midline, no  adenopathy;    Thyroid: no enlargement/tenderness/nodules  Back:     Symmetric, no curvature, ROM normal, no CVA tenderness  Lungs:     Clear to auscultation bilaterally, respirations unlabored  Chest Wall:    No tenderness or deformity   Heart:    Regular rate and rhythm, S1 and S2 normal, no murmur, rub   or gallop  Breast Exam:    Deferred to mammo  Abdomen:     Soft, non-tender, bowel sounds active all four quadrants,    no masses, no organomegaly  Genitalia:    Deferred  Rectal:    Extremities:   Extremities normal, atraumatic, no cyanosis or edema  Pulses:   2+ and symmetric all extremities  Skin:   Skin color, texture, turgor normal, no rashes or lesions  Lymph nodes:   Cervical, supraclavicular, and axillary nodes normal  Neurologic:   CNII-XII intact, normal strength, sensation and reflexes    throughout          Assessment & Plan:

## 2019-01-03 NOTE — Patient Instructions (Signed)
Follow up in 6 months to recheck BP and cholesterol We'll notify you of your lab results and make any changes if needed Keep up the good work!  You look great!!! Have them send me a copy of your mammogram Call with any questions or concerns Stay Safe!!!

## 2019-01-03 NOTE — Assessment & Plan Note (Signed)
Chronic problem.  Tolerating statin w/o difficulty.  Encouraged healthy diet and regular exercise.  Check labs.  Adjust meds prn. 

## 2019-01-03 NOTE — Assessment & Plan Note (Signed)
Chronic problem, following w/ Dr Dwyane Dee.  UTD on eye exam, microalbumin.  Has not had recent A1C.  Will get today and forward to Endo.

## 2019-01-04 ENCOUNTER — Encounter: Payer: Self-pay | Admitting: General Practice

## 2019-01-17 ENCOUNTER — Other Ambulatory Visit: Payer: Self-pay

## 2019-01-17 ENCOUNTER — Ambulatory Visit
Admission: RE | Admit: 2019-01-17 | Discharge: 2019-01-17 | Disposition: A | Discharge status: Home / Self Care | Payer: Medicare HMO | Source: Ambulatory Visit | Attending: Family Medicine | Admitting: Family Medicine

## 2019-01-17 DIAGNOSIS — Z1231 Encounter for screening mammogram for malignant neoplasm of breast: Secondary | ICD-10-CM | POA: Diagnosis not present

## 2019-01-18 DIAGNOSIS — K006 Disturbances in tooth eruption: Secondary | ICD-10-CM | POA: Diagnosis not present

## 2019-01-30 ENCOUNTER — Other Ambulatory Visit: Payer: Self-pay | Admitting: Family Medicine

## 2019-05-03 ENCOUNTER — Other Ambulatory Visit: Payer: Self-pay | Admitting: Family Medicine

## 2019-07-02 ENCOUNTER — Encounter: Payer: Self-pay | Admitting: Family Medicine

## 2019-07-02 MED ORDER — TIZANIDINE HCL 4 MG PO TABS: 4.0000 mg | ORAL_TABLET | Freq: Three times a day (TID) | ORAL | 0 refills | Status: DC | PRN

## 2019-07-03 ENCOUNTER — Telehealth: Payer: Self-pay | Admitting: Family Medicine

## 2019-07-03 ENCOUNTER — Encounter: Payer: Self-pay | Admitting: Family Medicine

## 2019-07-03 NOTE — Progress Notes (Signed)
  Chronic Care Management   Outreach Note  07/03/2019 Name: Tracy Williams MRN: [id number] DOB: Jan 10, 1946  Referred by: Midge Minium, MD Reason for referral : No chief complaint on file.   An unsuccessful telephone outreach was attempted today. The patient was referred to the pharmacist for assistance with care management and care coordination.   Follow Up Plan:   Earney Hamburg Upstream Scheduler

## 2019-07-04 ENCOUNTER — Encounter: Payer: Self-pay | Admitting: Family Medicine

## 2019-07-04 ENCOUNTER — Ambulatory Visit (INDEPENDENT_AMBULATORY_CARE_PROVIDER_SITE_OTHER): Payer: Medicare HMO | Admitting: Family Medicine

## 2019-07-04 ENCOUNTER — Other Ambulatory Visit: Payer: Self-pay

## 2019-07-04 VITALS — BP 131/80 | HR 79 | Temp 97.7°F | Resp 16 | Ht 64.0 in | Wt 161.2 lb

## 2019-07-04 DIAGNOSIS — Z794 Long term (current) use of insulin: Secondary | ICD-10-CM | POA: Diagnosis not present

## 2019-07-04 DIAGNOSIS — E785 Hyperlipidemia, unspecified: Secondary | ICD-10-CM | POA: Diagnosis not present

## 2019-07-04 DIAGNOSIS — E118 Type 2 diabetes mellitus with unspecified complications: Secondary | ICD-10-CM

## 2019-07-04 DIAGNOSIS — M546 Pain in thoracic spine: Secondary | ICD-10-CM

## 2019-07-04 LAB — POCT URINALYSIS DIPSTICK
Bilirubin, UA: NEGATIVE
Blood, UA: NEGATIVE
Glucose, UA: NEGATIVE
Ketones, UA: NEGATIVE
Leukocytes, UA: NEGATIVE
Nitrite, UA: NEGATIVE
Protein, UA: NEGATIVE
Spec Grav, UA: >=1.03 — AB (ref 1.010–1.025)
Urobilinogen, UA: 0.2 E.U./dL
pH, UA: 6 (ref 5.0–8.0)

## 2019-07-04 LAB — HEPATIC FUNCTION PANEL
ALT: 15 U/L (ref 0–35)
AST: 17 U/L (ref 0–37)
Albumin: 4.3 g/dL (ref 3.5–5.2)
Alkaline Phosphatase: 91 U/L (ref 39–117)
Bilirubin, Direct: 0.1 mg/dL (ref 0.0–0.3)
Total Bilirubin: 0.6 mg/dL (ref 0.2–1.2)
Total Protein: 6.6 g/dL (ref 6.0–8.3)

## 2019-07-04 LAB — CBC WITH DIFFERENTIAL/PLATELET
Basophils Absolute: 0 10*3/uL (ref 0.0–0.1)
Basophils Relative: 0.5 % (ref 0.0–3.0)
Eosinophils Absolute: 0.2 10*3/uL (ref 0.0–0.7)
Eosinophils Relative: 2.4 % (ref 0.0–5.0)
HCT: 36.5 % (ref 36.0–46.0)
Hemoglobin: 12.1 g/dL (ref 12.0–15.0)
Lymphocytes Relative: 18.9 % (ref 12.0–46.0)
Lymphs Abs: 1.9 10*3/uL (ref 0.7–4.0)
MCHC: 33.3 g/dL (ref 30.0–36.0)
MCV: 89.3 fl (ref 78.0–100.0)
Monocytes Absolute: 0.8 10*3/uL (ref 0.1–1.0)
Monocytes Relative: 7.8 % (ref 3.0–12.0)
Neutro Abs: 7 10*3/uL (ref 1.4–7.7)
Neutrophils Relative %: 70.4 % (ref 43.0–77.0)
Platelets: 341 10*3/uL (ref 150.0–400.0)
RBC: 4.08 Mil/uL (ref 3.87–5.11)
RDW: 14 % (ref 11.5–15.5)
WBC: 9.9 10*3/uL (ref 4.0–10.5)

## 2019-07-04 LAB — BASIC METABOLIC PANEL
BUN: 23 mg/dL (ref 6–23)
CO2: 29 mEq/L (ref 19–32)
Calcium: 9.5 mg/dL (ref 8.4–10.5)
Chloride: 102 mEq/L (ref 96–112)
Creatinine, Ser: 1.24 mg/dL — ABNORMAL HIGH (ref 0.40–1.20)
GFR: 42.33 mL/min — ABNORMAL LOW (ref >60.00)
Glucose, Bld: 118 mg/dL — ABNORMAL HIGH (ref 70–99)
Potassium: 4.9 mEq/L (ref 3.5–5.1)
Sodium: 139 mEq/L (ref 135–145)

## 2019-07-04 LAB — LIPID PANEL
Cholesterol: 191 mg/dL (ref 0–200)
HDL: 35 mg/dL — ABNORMAL LOW (ref >39.00)
NonHDL: 156.24
Total CHOL/HDL Ratio: 5
Triglycerides: 230 mg/dL — ABNORMAL HIGH (ref 0.0–149.0)
VLDL: 46 mg/dL — ABNORMAL HIGH (ref 0.0–40.0)

## 2019-07-04 LAB — MICROALBUMIN / CREATININE URINE RATIO
Creatinine,U: 168.7 mg/dL
Microalb Creat Ratio: 0.6 mg/g (ref 0.0–30.0)
Microalb, Ur: 0.9 mg/dL (ref 0.0–1.9)

## 2019-07-04 LAB — HEMOGLOBIN A1C: Hgb A1c MFr Bld: 6.8 % — ABNORMAL HIGH (ref 4.6–6.5)

## 2019-07-04 LAB — TSH: TSH: 1.84 u[IU]/mL (ref 0.35–4.50)

## 2019-07-04 LAB — LDL CHOLESTEROL, DIRECT: Direct LDL: 98 mg/dL

## 2019-07-04 MED ORDER — PREDNISONE 10 MG PO TABS: ORAL_TABLET | ORAL | 0 refills | Status: DC

## 2019-07-04 NOTE — Assessment & Plan Note (Signed)
Chronic problem.  Hx of good control on Glipizide and Janumet.  UTD on eye exam, foot exam.  Microalbumin ordered.  Check labs.  Adjust meds prn

## 2019-07-04 NOTE — Progress Notes (Signed)
   Subjective:    Patient ID: Tracy Williams, female    DOB: 19-Mar-1946, 74 y.o.   MRN: [id number]  HPI Hyperlipidemia- chronic problem, on Lipitor 40mg  daily.  No abd pain, N/V.  DM- chronic problem, on Glipizide 5mg , Janumet 50/500mg .  UTD on eye exam, foot exam.  Due for microalbumin.  No CP, SOB, HAs, visual changes.  No numbness/tingling of hands/feet.  Denies symptomatic lows.  R low back pain- sxs started 6 days ago and initially was 'very low' but has since come up somewhat to just below the ribs.  Denies urinary sxs such as dysuria, frequency, urgency.  No radiation of pain.  Initially had improvement w/ Tizanidine but now this is not helping.  Pain is now constant.  Improves w/ activity, worst when lying down.     Review of Systems For ROS see HPI   This visit occurred during the SARS-CoV-2 public health emergency.  Safety protocols were in place, including screening questions prior to the visit, additional usage of staff PPE, and extensive cleaning of exam room while observing appropriate contact time as indicated for disinfecting solutions.       Objective:   Physical Exam Vitals reviewed.  Constitutional:      General: She is not in acute distress.    Appearance: Normal appearance. She is well-developed.  HENT:     Head: Normocephalic and atraumatic.  Eyes:     Conjunctiva/sclera: Conjunctivae normal.     Pupils: Pupils are equal, round, and reactive to light.  Neck:     Thyroid: No thyromegaly.  Cardiovascular:     Rate and Rhythm: Normal rate and regular rhythm.     Heart sounds: Normal heart sounds. No murmur.  Pulmonary:     Effort: Pulmonary effort is normal. No respiratory distress.     Breath sounds: Normal breath sounds.  Abdominal:     General: There is no distension.     Palpations: Abdomen is soft.     Tenderness: There is no abdominal tenderness.  Musculoskeletal:     Cervical back: Normal range of motion and neck supple.     Comments: No TTP  over paraspinal muscles, no CVA tenderness, (-) SLR bilaterally  Lymphadenopathy:     Cervical: No cervical adenopathy.  Skin:    General: Skin is warm and dry.  Neurological:     General: No focal deficit present.     Mental Status: She is alert and oriented to person, place, and time.     Motor: No weakness.     Coordination: Coordination normal.     Gait: Gait normal.     Deep Tendon Reflexes: Reflexes normal.  Psychiatric:        Behavior: Behavior normal.           Assessment & Plan:  Back pain- not reproducible on exam today.  UA WNL.  Some relief w/ heat and muscle relaxers which suggest musculoskeletal etiology.  Start Pred taper.  Cautioned her about her sugars during this.  Will monitor for improvement and if none, will refer for evaluation.  Pt expressed understanding and is in agreement w/ plan.

## 2019-07-04 NOTE — Assessment & Plan Note (Signed)
Chronic problem.  Tolerating statin w/o difficulty.  Check labs.  Adjust meds prn  

## 2019-07-04 NOTE — Patient Instructions (Addendum)
Schedule your complete physical in 6 months We'll notify you of your lab results and make any changes if needed START the Prednisone as directed- 3 tabs at the same time x3 days and then 2 tabs at the same time x3 days and then 1 tab daily.  Take w/ food Continue the muscle relaxer as needed HEAT! PLEASE get your COVID vaccine Call with any questions or concerns Hang in there!!

## 2019-07-05 ENCOUNTER — Encounter: Payer: Self-pay | Admitting: General Practice

## 2019-07-06 ENCOUNTER — Other Ambulatory Visit: Payer: Self-pay | Admitting: Endocrinology

## 2019-07-09 ENCOUNTER — Encounter: Payer: Self-pay | Admitting: Family Medicine

## 2019-07-09 DIAGNOSIS — M546 Pain in thoracic spine: Secondary | ICD-10-CM

## 2019-07-13 ENCOUNTER — Telehealth: Payer: Self-pay | Admitting: Family Medicine

## 2019-07-13 NOTE — Progress Notes (Signed)
  Chronic Care Management   Outreach Note  07/13/2019 Name: Gisel Dewinter MRN: [id number] DOB: 1946/01/28  Referred by: Midge Minium, MD Reason for referral : No chief complaint on file.   An unsuccessful telephone outreach was attempted today. The patient was referred to the pharmacist for assistance with care management and care coordination.  This note is not being shared with the patient for the following reason: To respect privacy (The patient or proxy has requested that the information not be shared). Follow Up Plan:   Earney Hamburg Upstream Scheduler

## 2019-07-16 ENCOUNTER — Other Ambulatory Visit: Payer: Self-pay | Admitting: Family Medicine

## 2019-07-16 DIAGNOSIS — M545 Low back pain: Secondary | ICD-10-CM | POA: Diagnosis not present

## 2019-07-19 ENCOUNTER — Other Ambulatory Visit: Payer: Self-pay | Admitting: Endocrinology

## 2019-07-20 ENCOUNTER — Telehealth: Payer: Self-pay | Admitting: Family Medicine

## 2019-07-20 NOTE — Progress Notes (Signed)
  Chronic Care Management   Outreach Note  07/20/2019 Name: Giani Schaben MRN: [id number] DOB: 21-Aug-1945  Referred by: Midge Minium, MD Reason for referral : No chief complaint on file.   An unsuccessful telephone outreach was attempted today. The patient was referred to the pharmacist for assistance with care management and care coordination.  This note is not being shared with the patient for the following reason: To respect privacy (The patient or proxy has requested that the information not be shared). Follow Up Plan:   Earney Hamburg Upstream Scheduler

## 2019-07-24 ENCOUNTER — Other Ambulatory Visit: Payer: Self-pay

## 2019-07-24 ENCOUNTER — Encounter: Payer: Self-pay | Admitting: Family Medicine

## 2019-07-24 MED ORDER — ATORVASTATIN CALCIUM 40 MG PO TABS: 40.0000 mg | ORAL_TABLET | Freq: Every day | ORAL | 1 refills | Status: DC

## 2019-10-10 ENCOUNTER — Other Ambulatory Visit: Payer: Self-pay | Admitting: Family Medicine

## 2019-12-24 ENCOUNTER — Other Ambulatory Visit: Payer: Self-pay | Admitting: Family Medicine

## 2019-12-24 DIAGNOSIS — Z1231 Encounter for screening mammogram for malignant neoplasm of breast: Secondary | ICD-10-CM

## 2019-12-31 ENCOUNTER — Ambulatory Visit (INDEPENDENT_AMBULATORY_CARE_PROVIDER_SITE_OTHER): Payer: Medicare HMO | Admitting: Family Medicine

## 2019-12-31 ENCOUNTER — Other Ambulatory Visit: Payer: Self-pay

## 2019-12-31 ENCOUNTER — Encounter: Payer: Self-pay | Admitting: Family Medicine

## 2019-12-31 VITALS — BP 123/80 | HR 86 | Temp 97.6°F | Resp 16 | Ht 64.0 in | Wt 162.2 lb

## 2019-12-31 DIAGNOSIS — M810 Age-related osteoporosis without current pathological fracture: Secondary | ICD-10-CM

## 2019-12-31 DIAGNOSIS — E118 Type 2 diabetes mellitus with unspecified complications: Secondary | ICD-10-CM | POA: Diagnosis not present

## 2019-12-31 DIAGNOSIS — Z794 Long term (current) use of insulin: Secondary | ICD-10-CM

## 2019-12-31 DIAGNOSIS — E785 Hyperlipidemia, unspecified: Secondary | ICD-10-CM | POA: Diagnosis not present

## 2019-12-31 DIAGNOSIS — Z23 Encounter for immunization: Secondary | ICD-10-CM | POA: Diagnosis not present

## 2019-12-31 LAB — HEPATIC FUNCTION PANEL
ALT: 11 U/L (ref 0–35)
AST: 14 U/L (ref 0–37)
Albumin: 4.1 g/dL (ref 3.5–5.2)
Alkaline Phosphatase: 98 U/L (ref 39–117)
Bilirubin, Direct: 0.1 mg/dL (ref 0.0–0.3)
Total Bilirubin: 0.8 mg/dL (ref 0.2–1.2)
Total Protein: 6.7 g/dL (ref 6.0–8.3)

## 2019-12-31 LAB — LIPID PANEL
Cholesterol: 169 mg/dL (ref 0–200)
HDL: 37.4 mg/dL — ABNORMAL LOW (ref >39.00)
LDL Cholesterol: 98 mg/dL (ref 0–99)
NonHDL: 131.28
Total CHOL/HDL Ratio: 5
Triglycerides: 165 mg/dL — ABNORMAL HIGH (ref 0.0–149.0)
VLDL: 33 mg/dL (ref 0.0–40.0)

## 2019-12-31 LAB — CBC WITH DIFFERENTIAL/PLATELET
Basophils Absolute: 0.1 10*3/uL (ref 0.0–0.1)
Basophils Relative: 0.7 % (ref 0.0–3.0)
Eosinophils Absolute: 0.3 10*3/uL (ref 0.0–0.7)
Eosinophils Relative: 2 % (ref 0.0–5.0)
HCT: 37 % (ref 36.0–46.0)
Hemoglobin: 12.3 g/dL (ref 12.0–15.0)
Lymphocytes Relative: 14.6 % (ref 12.0–46.0)
Lymphs Abs: 2 10*3/uL (ref 0.7–4.0)
MCHC: 33.2 g/dL (ref 30.0–36.0)
MCV: 89.3 fl (ref 78.0–100.0)
Monocytes Absolute: 1.2 10*3/uL — ABNORMAL HIGH (ref 0.1–1.0)
Monocytes Relative: 8.7 % (ref 3.0–12.0)
Neutro Abs: 10 10*3/uL — ABNORMAL HIGH (ref 1.4–7.7)
Neutrophils Relative %: 74 % (ref 43.0–77.0)
Platelets: 354 10*3/uL (ref 150.0–400.0)
RBC: 4.15 Mil/uL (ref 3.87–5.11)
RDW: 14.4 % (ref 11.5–15.5)
WBC: 13.4 10*3/uL — ABNORMAL HIGH (ref 4.0–10.5)

## 2019-12-31 LAB — BASIC METABOLIC PANEL
BUN: 22 mg/dL (ref 6–23)
CO2: 31 mEq/L (ref 19–32)
Calcium: 9.5 mg/dL (ref 8.4–10.5)
Chloride: 101 mEq/L (ref 96–112)
Creatinine, Ser: 1.21 mg/dL — ABNORMAL HIGH (ref 0.40–1.20)
GFR: 44.02 mL/min — ABNORMAL LOW (ref >60.00)
Glucose, Bld: 120 mg/dL — ABNORMAL HIGH (ref 70–99)
Potassium: 4.9 mEq/L (ref 3.5–5.1)
Sodium: 140 mEq/L (ref 135–145)

## 2019-12-31 LAB — TSH: TSH: 1.08 u[IU]/mL (ref 0.35–4.50)

## 2019-12-31 LAB — VITAMIN D 25 HYDROXY (VIT D DEFICIENCY, FRACTURES): VITD: 69.98 ng/mL (ref 30.00–100.00)

## 2019-12-31 LAB — HEMOGLOBIN A1C: Hgb A1c MFr Bld: 6.8 % — ABNORMAL HIGH (ref 4.6–6.5)

## 2019-12-31 NOTE — Assessment & Plan Note (Signed)
Pt's last DEXA was 2018.  Due for repeat- ordered.  Check Vit D level and replete prn.

## 2019-12-31 NOTE — Assessment & Plan Note (Signed)
Chronic problem.  Currently on Glipizide 5mg  daily and Janumet 50/500mg  BID.  Eye exam scheduled.  UTD on microalbumin.  Check labs.  Adjust meds prn

## 2019-12-31 NOTE — Assessment & Plan Note (Signed)
Chronic problem.  On Lipitor 40mg  daily.  Applauded her efforts at walking.  Check labs.  Adjust meds prn

## 2019-12-31 NOTE — Patient Instructions (Signed)
Schedule your complete physical in 3-4 months We'll notify you of your lab results and make any changes if needed Keep up the good work on healthy diet and regular exercise- you're doing great! Have your eye doctor send me a copy of their report We'll call you to schedule your bone density test Please get your COVID vaccines Call with any questions or concerns Stay Safe!  Stay Healthy!

## 2019-12-31 NOTE — Progress Notes (Signed)
   Subjective:    Patient ID: Tracy Williams, female    DOB: 1945-05-14, 74 y.o.   MRN: [id number]  HPI DM- chronic problem, on Glipizide 5mg  daily and Janumet 50/500mg  BID.  Due for foot exam, eye exam (scheduled).  UTD on microalbumin.  No CP, SOB, HAs, visual changes,   Hyperlipidemia- chronic problem, on Atorvastatin 40mg  daily.  Walking regularly.  No abd pain, N/V.  Osteoporosis- chronic problem, on Vit D but not currently on prescription treatment.   Review of Systems For ROS see HPI   This visit occurred during the SARS-CoV-2 public health emergency.  Safety protocols were in place, including screening questions prior to the visit, additional usage of staff PPE, and extensive cleaning of exam room while observing appropriate contact time as indicated for disinfecting solutions.       Objective:   Physical Exam Vitals reviewed.  Constitutional:      General: She is not in acute distress.    Appearance: Normal appearance. She is well-developed.  HENT:     Head: Normocephalic and atraumatic.  Eyes:     Conjunctiva/sclera: Conjunctivae normal.     Pupils: Pupils are equal, round, and reactive to light.  Neck:     Thyroid: No thyromegaly.  Cardiovascular:     Rate and Rhythm: Normal rate and regular rhythm.     Heart sounds: Normal heart sounds. No murmur heard.   Pulmonary:     Effort: Pulmonary effort is normal. No respiratory distress.     Breath sounds: Normal breath sounds.  Abdominal:     General: There is no distension.     Palpations: Abdomen is soft.     Tenderness: There is no abdominal tenderness.  Musculoskeletal:     Cervical back: Normal range of motion and neck supple.  Lymphadenopathy:     Cervical: No cervical adenopathy.  Skin:    General: Skin is warm and dry.  Neurological:     Mental Status: She is alert and oriented to person, place, and time.  Psychiatric:        Behavior: Behavior normal.           Assessment & Plan:

## 2019-12-31 NOTE — Addendum Note (Signed)
Addended by: Fritz Pickerel on: 12/31/2019 10:47 AM   Modules accepted: Orders

## 2020-01-03 ENCOUNTER — Other Ambulatory Visit: Payer: Self-pay | Admitting: General Practice

## 2020-01-03 DIAGNOSIS — D72829 Elevated white blood cell count, unspecified: Secondary | ICD-10-CM

## 2020-01-07 ENCOUNTER — Ambulatory Visit (INDEPENDENT_AMBULATORY_CARE_PROVIDER_SITE_OTHER): Payer: Medicare HMO

## 2020-01-07 VITALS — Ht 64.0 in | Wt 162.0 lb

## 2020-01-07 DIAGNOSIS — Z0000 Encounter for general adult medical examination without abnormal findings: Secondary | ICD-10-CM | POA: Diagnosis not present

## 2020-01-07 NOTE — Progress Notes (Signed)
Subjective:   Tracy Williams is a 74 y.o. female who presents for Medicare Annual (Subsequent) preventive examination.  I connected with Tracy Williams today by telephone and verified that I am speaking with the correct person using two identifiers. Location patient: home Location provider: work Persons participating in the virtual visit: patient, Marine scientist.    I discussed the limitations, risks, security and privacy concerns of performing an evaluation and management service by telephone and the availability of in person appointments. I also discussed with the patient that there may be a patient responsible charge related to this service. The patient expressed understanding and verbally consented to this telephonic visit.    Interactive audio and video telecommunications were attempted between this provider and patient, however failed, due to patient having technical difficulties OR patient did not have access to video capability.  We continued and completed visit with audio only.  Some vital signs may be absent or patient reported.   Time Spent with patient on telephone encounter: 20 minutes  Review of Systems     Cardiac Risk Factors include: advanced age (>80men, >76 women);diabetes mellitus;dyslipidemia;hypertension;sedentary lifestyle     Objective:    Today's Vitals   01/07/20 1245  Weight: 162 lb (73.5 kg)  Height: $Remove'5\' 4"'MiRdVZJ$  (1.626 m)   Body mass index is 27.81 kg/m.  Advanced Directives 01/07/2020 01/03/2019 09/29/2018 01/16/2017 11/11/2016 08/12/2016 02/27/2016  Does Patient Have a Medical Advance Directive? Yes Yes Yes Yes Yes Yes Yes  Type of Paramedic of [place];Living will [place];Living will Living will [place];Living will - [place];Living will [place];Living will  Does patient want to make changes to medical advance directive? - Yes (MAU/Ambulatory/Procedural Areas  - Information given) No - Patient declined - - - No - Patient declined  Copy of [place] in Chart? No - copy requested No - copy requested - - - No - copy requested No - copy requested    Current Medications (verified) Outpatient Encounter Medications as of 01/07/2020  Medication Sig  . ACCU-CHEK SOFTCLIX LANCETS lancets Use as instructed to test sugars 2-3 times daily. E11.9  . Alcohol Swabs (B-D SINGLE USE SWABS REGULAR) PADS USE ONE SWAB EACH TIME SUGARS ARE TESTED AS DIRECTED  . Ascorbic Acid (VITAMIN C PO) Take by mouth.  . Blood Glucose Monitoring Suppl (ACCU-CHEK GUIDE) w/Device KIT 1 each by Does not apply route 3 (three) times daily.  . Cholecalciferol (VITAMIN D3) 2000 units TABS Take 2,000 Units by mouth daily.   . diphenhydrAMINE (BENADRYL) 25 mg capsule Take 25-50 mg by mouth as needed (for allergic reactions).  . EPINEPHrine 0.3 mg/0.3 mL IJ SOAJ injection Inject 0.3 mLs (0.3 mg total) into the muscle as needed for anaphylaxis.  . fexofenadine (ALLEGRA) 180 MG tablet Take 1 tablet (180 mg total) by mouth daily as needed for allergies or rhinitis.  . fluticasone (FLONASE) 50 MCG/ACT nasal spray Place 2 sprays into both nostrils daily as needed for allergies or rhinitis.  Marland Kitchen glipiZIDE (GLUCOTROL XL) 5 MG 24 hr tablet TAKE 1 TABLET BY MOUTH EVERY DAY WITH BREAKFAST  . glucose blood (ACCU-CHEK GUIDE) test strip Use as instructed  . JANUMET 50-500 MG tablet TAKE 1 TABLET BY MOUTH 2 (TWO) TIMES DAILY WITH A MEAL. (Patient taking differently: Take 1 tablet by mouth daily. )  . Multiple Vitamins-Minerals (ZINC PO) Take by mouth.  Marland Kitchen tiZANidine (ZANAFLEX) 4 MG tablet Take 1 tablet (4  mg total) by mouth every 8 (eight) hours as needed for muscle spasms.  Marland Kitchen atorvastatin (LIPITOR) 40 MG tablet Take 1 tablet (40 mg total) by mouth daily.   No facility-administered encounter medications on file as of 01/07/2020.    Allergies (verified) Lisinopril, Metformin and related,  Darvocet [propoxyphene n-acetaminophen], Demerol [meperidine], Hydrochlorothiazide, Percocet [oxycodone-acetaminophen], and Vicodin [hydrocodone-acetaminophen]   History: Past Medical History:  Diagnosis Date  . Allergy   . Anemia    hx  . Angio-edema   . Arthritis   . Diabetes mellitus without complication ([place])    Type II  . Dysrhythmia    Hx: of palpitations in 2010 only  . GERD (gastroesophageal reflux disease)   . Hypertension   . Pancreatic mass    Archie Endo 02/25/2016  . PONV (postoperative nausea and vomiting)    pt reports no PONV after biopsy on 12/24/15  . Right knee DJD   . Stress incontinence   . Urinary frequency   . Urinary urgency   . Wears glasses    Past Surgical History:  Procedure Laterality Date  . CESAREAN SECTION  1975  . COLONOSCOPY W/ BIOPSIES AND POLYPECTOMY     Hx: of in 2012  . DILATION AND CURETTAGE OF UTERUS     Hx: of 1978  . EUS N/A 12/24/2015   Procedure: UPPER ENDOSCOPIC ULTRASOUND (EUS) RADIAL;  Surgeon: Arta Silence, MD;  Location: [facility];  Service: Endoscopy;  Laterality: N/A;  . FINE NEEDLE ASPIRATION N/A 12/24/2015   Procedure: FINE NEEDLE ASPIRATION (FNA) RADIAL;  Surgeon: Arta Silence, MD;  Location: [facility];  Service: Endoscopy;  Laterality: N/A;  . JOINT REPLACEMENT     Hx: of left Knee 2010  . LAPAROSCOPIC DISTAL PANCREATECTOMY  02/25/2016   Diagnostic laparoscopy, laparoscopic hand-assisted   . LAPAROSCOPIC SPLENECTOMY N/A 02/25/2016   Procedure: LAPAROSCOPIC SPLENECTOMY;  Surgeon: Stark Klein, MD;  Location: [facility];  Service: General;  Laterality: N/A;  . PANCREATECTOMY N/A 02/25/2016   Procedure: LAPAROSCOPIC DISTAL PANCREATECTOMY;  Surgeon: Stark Klein, MD;  Location: [facility];  Service: General;  Laterality: N/A;  . SPLENECTOMY  02/25/2016  . TOTAL KNEE ARTHROPLASTY  2010  . TOTAL KNEE ARTHROPLASTY Right 06/26/2012   Dr Noemi Chapel  . TOTAL KNEE ARTHROPLASTY Right 06/26/2012   Procedure: TOTAL KNEE ARTHROPLASTY- right;   Surgeon: Lorn Junes, MD;  Location: [facility];  Service: Orthopedics;  Laterality: Right;   Family History  Problem Relation Age of Onset  . Diabetes Mellitus II Father   . Hypertension Father   . Cancer - Other Mother        Breast   Social History   Socioeconomic History  . Marital status: Widowed    Spouse name: Not on file  . Number of children: Not on file  . Years of education: Not on file  . Highest education level: Not on file  Occupational History  . Occupation: retired  Tobacco Use  . Smoking status: Never Smoker  . Smokeless tobacco: Never Used  Vaping Use  . Vaping Use: Never used  Substance and Sexual Activity  . Alcohol use: No  . Drug use: No  . Sexual activity: Not on file  Other Topics Concern  . Not on file  Social History Narrative  . Not on file   Social Determinants of Health   Financial Resource Strain: Low Risk   . Difficulty of Paying Living Expenses: Not hard at all  Food Insecurity: No Food Insecurity  . Worried About Running  Out of Food in the Last Year: Never true  . Ran Out of Food in the Last Year: Never true  Transportation Needs: No Transportation Needs  . Lack of Transportation (Medical): No  . Lack of Transportation (Non-Medical): No  Physical Activity: Inactive  . Days of Exercise per Week: 0 days  . Minutes of Exercise per Session: 0 min  Stress: No Stress Concern Present  . Feeling of Stress : Not at all  Social Connections: Moderately Integrated  . Frequency of Communication with Friends and Family: More than three times a week  . Frequency of Social Gatherings with Friends and Family: Once a week  . Attends Religious Services: More than 4 times per year  . Active Member of Clubs or Organizations: Yes  . Attends Banker Meetings: 1 to 4 times per year  . Marital Status: Widowed    Tobacco Counseling Counseling given: Not Answered   Clinical Intake:  Pre-visit preparation completed: Yes  Pain :  No/denies pain     Nutritional Status: BMI 25 -29 Overweight Nutritional Risks: None Diabetes: Yes CBG done?: No Did pt. bring in CBG monitor from home?: No (phone visit)  How often do you need to have someone help you when you read instructions, pamphlets, or other written materials from your doctor or pharmacy?: 1 - Never What is the last grade level you completed in school?: BA Degree  Diabetes:  Is the patient diabetic?  Yes  If diabetic, was a CBG obtained today?  No  Did the patient bring in their glucometer from home?  No phone visit How often do you monitor your CBG's? Once a week.   Financial Strains and Diabetes Management:  Are you having any financial strains with the device, your supplies or your medication? No .  Does the patient want to be seen by Chronic Care Management for management of their diabetes?  No  Would the patient like to be referred to a Nutritionist or for Diabetic Management?  No   Diabetic Exams:  Diabetic Eye Exam: Overdue for diabetic eye exam. Pt has been advised about the importance in completing this exam. Appt scheduled for 02/07/20  Diabetic Foot Exam: Completed 12/31/2019.    Interpreter Needed?: No  Information entered by :: Thomasenia Sales LPN   Activities of Daily Living In your present state of health, do you have any difficulty performing the following activities: 01/07/2020 12/31/2019  Hearing? N N  Vision? N N  Difficulty concentrating or making decisions? N N  Walking or climbing stairs? N N  Dressing or bathing? N N  Doing errands, shopping? N N  Preparing Food and eating ? N -  Using the Toilet? N -  In the past six months, have you accidently leaked urine? N -  Do you have problems with loss of bowel control? N -  Managing your Medications? N -  Managing your Finances? N -  Housekeeping or managing your Housekeeping? N -  Some recent data might be hidden    Patient Care Team: Sheliah Hatch, MD as PCP -  General (Family Medicine) Vida Rigger, MD as Consulting Physician (Gastroenterology) Salvatore Marvel, MD as Consulting Physician (Orthopedic Surgery) Lyn Records, MD as Consulting Physician (Cardiology)  Indicate any recent Medical Services you may have received from other than [facility] providers in the past year (date may be approximate).     Assessment:   This is a routine wellness examination for [place].  Hearing/Vision screen  Hearing  Screening   '125Hz'$  $Remo'250Hz'TiLVi$'500Hz'$'1000Hz'$'2000Hz'$'3000Hz'$'4000Hz'$'6000Hz'$'8000Hz'$   Right ear:           Left ear:           Vision Screening Comments: Wears glasses Dr. Delman Cheadle  Dietary issues and exercise activities discussed: Current Exercise Habits: The patient does not participate in regular exercise at present, Exercise limited by: None identified  Goals    . Patient Stated     Drink more water & increase activity by walking more      Depression Screen PHQ 2/9 Scores 01/07/2020 12/31/2019 07/04/2019 01/03/2019 07/07/2018 01/04/2018 03/28/2017  PHQ - 2 Score 0 0 0 0 0 0 1  PHQ- 9 Score - 0 0 0 - 0 1  Exception Documentation - - - - - - -    Fall Risk Fall Risk  01/07/2020 12/31/2019 07/04/2019 01/03/2019 07/07/2018  Falls in the past year? 0 0 0 0 0  Comment - - - - -  Number falls in past yr: 0 0 0 0 -  Comment - - - - -  Injury with Fall? 0 0 0 0 -  Risk for fall due to : - - - - -  Risk for fall due to: Comment - - - - -  Follow up Falls prevention discussed Falls evaluation completed Falls evaluation completed Falls evaluation completed Falls evaluation completed    Any stairs in or around the home? Yes  If so, are there any without handrails? No  Home free of loose throw rugs in walkways, pet beds, electrical cords, etc? No  pt advised of trip hazard Adequate lighting in your home to reduce risk of falls? Yes   ASSISTIVE DEVICES UTILIZED TO PREVENT FALLS:  Life alert? No  Use of a cane, walker or w/c? No  Grab bars in the bathroom? Yes    Shower chair or bench in shower? No  Elevated toilet seat or a handicapped toilet? No   TIMED UP AND GO:  Was the test performed? No . Phone visit   Cognitive Function:No cognitive impairment noted        Immunizations Immunization History  Administered Date(s) Administered  . Fluad Quad(high Dose 65+) 01/03/2019, 12/31/2019  . HiB (PRP-T) 01/29/2016  . Influenza, High Dose Seasonal PF 12/27/2016  . Meningococcal Mcv4o 01/29/2016  . Pneumococcal Conjugate-13 05/08/2014  . Pneumococcal Polysaccharide-23 06/27/2012    TDAP status: Due, Education has been provided regarding the importance of this vaccine. Advised may receive this vaccine at local pharmacy or Health Dept. Aware to provide a copy of the vaccination record if obtained from local pharmacy or Health Dept. Verbalized acceptance and understanding. Flu Vaccine status: Up to date Pneumococcal vaccine status: Up to date Covid-19 vaccine status: Declined, Education has been provided regarding the importance of this vaccine but patient still declined. Advised may receive this vaccine at local pharmacy or Health Dept.or vaccine clinic. Aware to provide a copy of the vaccination record if obtained from local pharmacy or Health Dept. Verbalized acceptance and understanding.  Qualifies for Shingles Vaccine? Yes   Zostavax completed No   Shingrix Completed?: No.    Education has been provided regarding the importance of this vaccine. Patient has been advised to call insurance company to determine out of pocket expense if they have not yet received this vaccine. Advised may also receive vaccine at local pharmacy or Health Dept. Verbalized acceptance and understanding.  Screening Tests Health Maintenance  Topic Date Due  .  Hepatitis C Screening  Never done  . TETANUS/TDAP  Never done  . OPHTHALMOLOGY EXAM  12/01/2019  . COVID-19 Vaccine (1) 01/16/2020 (Originally 12/04/1957)  . MAMMOGRAM  01/17/2020  . HEMOGLOBIN A1C  06/30/2020   . FOOT EXAM  12/30/2020  . COLONOSCOPY  06/27/2021  . INFLUENZA VACCINE  Completed  . DEXA SCAN  Completed  . PNA vac Low Risk Adult  Completed    Health Maintenance  Health Maintenance Due  Topic Date Due  . Hepatitis C Screening  Never done  . TETANUS/TDAP  Never done  . OPHTHALMOLOGY EXAM  12/01/2019    Colorectal cancer screening: Completed 06/28/2011. Repeat every 10 years   Mammogram status: Ordered previously. Pt provided with contact info and advised to call to schedule appt.  Scheduled for 01/21/2020  Bone Density status: Ordered previously. Pt provided with contact info and advised to call to schedule appt.  Lung Cancer Screening: (Low Dose CT Chest recommended if Age 70-80 years, 30 pack-year currently smoking OR have quit w/in 15years.) does not qualify.     Additional Screening:  Hepatitis C Screening: does qualify; Discuss with PCP  Vision Screening: Recommended annual ophthalmology exams for early detection of glaucoma and other disorders of the eye. Is the patient up to date with their annual eye exam?  Yes  Who is the provider or what is the name of the office in which the patient attends annual eye exams? Dr. Delman Cheadle   Dental Screening: Recommended annual dental exams for proper oral hygiene  Community Resource Referral / Chronic Care Management: CRR required this visit?  No   CCM required this visit?  No      Plan:     I have personally reviewed and noted the following in the patient's chart:   . Medical and social history . Use of alcohol, tobacco or illicit drugs  . Current medications and supplements . Functional ability and status . Nutritional status . Physical activity . Advanced directives . List of other physicians . Hospitalizations, surgeries, and ER visits in previous 12 months . Vitals . Screenings to include cognitive, depression, and falls . Referrals and appointments  In addition, I have reviewed and discussed with patient  certain preventive protocols, quality metrics, and best practice recommendations. A written personalized care plan for preventive services as well as general preventive health recommendations were provided to patient.   Due to this being a telephonic visit, the after visit summary with patients personalized plan was offered to patient via mail or my-chart. Patient would like to access on my-chart.   Marta Antu, LPN   [id number]  Nurse Health Advisor  Nurse Notes: None

## 2020-01-07 NOTE — Patient Instructions (Signed)
Tracy Williams , Thank you for taking time to complete your Medicare Wellness Visit. I appreciate your ongoing commitment to your health goals. Please review the following plan we discussed and let me know if I can assist you in the future.   Screening recommendations/referrals: Colonoscopy: Completed 07/08/2011- No longer required after age 74 Mammogram: Scheduled for 01/21/2020 Bone Density: Ordered-Please call to schedule Recommended yearly ophthalmology/optometry visit for glaucoma screening and checkup Recommended yearly dental visit for hygiene and checkup  Vaccinations: Influenza vaccine: Up to Date Pneumococcal vaccine: Completed vaccines Tdap vaccine: Discuss with pharmacy Shingles vaccine: Dsicuss with pharmacy   Covid-19:Declined  Advanced directives: Please bring a copy for your chart  Conditions/risks identified: See problem list  Next appointment: Follow up in one year for your annual wellness visit 01/12/2021 @ 1:30pm   Preventive Care 65 Years and Older, Female Preventive care refers to lifestyle choices and visits with your health care provider that can promote health and wellness. What does preventive care include?  A yearly physical exam. This is also called an annual well check.  Dental exams once or twice a year.  Routine eye exams. Ask your health care provider how often you should have your eyes checked.  Personal lifestyle choices, including:  Daily care of your teeth and gums.  Regular physical activity.  Eating a healthy diet.  Avoiding tobacco and drug use.  Limiting alcohol use.  Practicing safe sex.  Taking low-dose aspirin every day.  Taking vitamin and mineral supplements as recommended by your health care provider. What happens during an annual well check? The services and screenings done by your health care provider during your annual well check will depend on your age, overall health, lifestyle risk factors, and family history of  disease. Counseling  Your health care provider may ask you questions about your:  Alcohol use.  Tobacco use.  Drug use.  Emotional well-being.  Home and relationship well-being.  Sexual activity.  Eating habits.  History of falls.  Memory and ability to understand (cognition).  Work and work Statistician.  Reproductive health. Screening  You may have the following tests or measurements:  Height, weight, and BMI.  Blood pressure.  Lipid and cholesterol levels. These may be checked every 5 years, or more frequently if you are over 40 years old.  Skin check.  Lung cancer screening. You may have this screening every year starting at age 25 if you have a 30-pack-year history of smoking and currently smoke or have quit within the past 15 years.  Fecal occult blood test (FOBT) of the stool. You may have this test every year starting at age 24.  Flexible sigmoidoscopy or colonoscopy. You may have a sigmoidoscopy every 5 years or a colonoscopy every 10 years starting at age 101.  Hepatitis C blood test.  Hepatitis B blood test.  Sexually transmitted disease (STD) testing.  Diabetes screening. This is done by checking your blood sugar (glucose) after you have not eaten for a while (fasting). You may have this done every 1-3 years.  Bone density scan. This is done to screen for osteoporosis. You may have this done starting at age 22.  Mammogram. This may be done every 1-2 years. Talk to your health care provider about how often you should have regular mammograms. Talk with your health care provider about your test results, treatment options, and if necessary, the need for more tests. Vaccines  Your health care provider may recommend certain vaccines, such as:  Influenza vaccine. This  is recommended every year.  Tetanus, diphtheria, and acellular pertussis (Tdap, Td) vaccine. You may need a Td booster every 10 years.  Zoster vaccine. You may need this after age  83.  Pneumococcal 13-valent conjugate (PCV13) vaccine. One dose is recommended after age 49.  Pneumococcal polysaccharide (PPSV23) vaccine. One dose is recommended after age 21. Talk to your health care provider about which screenings and vaccines you need and how often you need them. This information is not intended to replace advice given to you by your health care provider. Make sure you discuss any questions you have with your health care provider. Document Released: 04/04/2015 Document Revised: 11/26/2015 Document Reviewed: 01/07/2015 Elsevier Interactive Patient Education  2017 [place] Prevention in the Home Falls can cause injuries. They can happen to people of all ages. There are many things you can do to make your home safe and to help prevent falls. What can I do on the outside of my home?  Regularly fix the edges of walkways and driveways and fix any cracks.  Remove anything that might make you trip as you walk through a door, such as a raised step or threshold.  Trim any bushes or trees on the path to your home.  Use bright outdoor lighting.  Clear any walking paths of anything that might make someone trip, such as rocks or tools.  Regularly check to see if handrails are loose or broken. Make sure that both sides of any steps have handrails.  Any raised decks and porches should have guardrails on the edges.  Have any leaves, snow, or ice cleared regularly.  Use sand or salt on walking paths during winter.  Clean up any spills in your garage right away. This includes oil or grease spills. What can I do in the bathroom?  Use night lights.  Install grab bars by the toilet and in the tub and shower. Do not use towel bars as grab bars.  Use non-skid mats or decals in the tub or shower.  If you need to sit down in the shower, use a plastic, non-slip stool.  Keep the floor dry. Clean up any water that spills on the floor as soon as it happens.  Remove  soap buildup in the tub or shower regularly.  Attach bath mats securely with double-sided non-slip rug tape.  Do not have throw rugs and other things on the floor that can make you trip. What can I do in the bedroom?  Use night lights.  Make sure that you have a light by your bed that is easy to reach.  Do not use any sheets or blankets that are too big for your bed. They should not hang down onto the floor.  Have a firm chair that has side arms. You can use this for support while you get dressed.  Do not have throw rugs and other things on the floor that can make you trip. What can I do in the kitchen?  Clean up any spills right away.  Avoid walking on wet floors.  Keep items that you use a lot in easy-to-reach places.  If you need to reach something above you, use a strong step stool that has a grab bar.  Keep electrical cords out of the way.  Do not use floor polish or wax that makes floors slippery. If you must use wax, use non-skid floor wax.  Do not have throw rugs and other things on the floor that can make you  trip. What can I do with my stairs?  Do not leave any items on the stairs.  Make sure that there are handrails on both sides of the stairs and use them. Fix handrails that are broken or loose. Make sure that handrails are as long as the stairways.  Check any carpeting to make sure that it is firmly attached to the stairs. Fix any carpet that is loose or worn.  Avoid having throw rugs at the top or bottom of the stairs. If you do have throw rugs, attach them to the floor with carpet tape.  Make sure that you have a light switch at the top of the stairs and the bottom of the stairs. If you do not have them, ask someone to add them for you. What else can I do to help prevent falls?  Wear shoes that:  Do not have high heels.  Have rubber bottoms.  Are comfortable and fit you well.  Are closed at the toe. Do not wear sandals.  If you use a  stepladder:  Make sure that it is fully opened. Do not climb a closed stepladder.  Make sure that both sides of the stepladder are locked into place.  Ask someone to hold it for you, if possible.  Clearly mark and make sure that you can see:  Any grab bars or handrails.  First and last steps.  Where the edge of each step is.  Use tools that help you move around (mobility aids) if they are needed. These include:  Canes.  Walkers.  Scooters.  Crutches.  Turn on the lights when you go into a dark area. Replace any light bulbs as soon as they burn out.  Set up your furniture so you have a clear path. Avoid moving your furniture around.  If any of your floors are uneven, fix them.  If there are any pets around you, be aware of where they are.  Review your medicines with your doctor. Some medicines can make you feel dizzy. This can increase your chance of falling. Ask your doctor what other things that you can do to help prevent falls. This information is not intended to replace advice given to you by your health care provider. Make sure you discuss any questions you have with your health care provider. Document Released: 01/02/2009 Document Revised: 08/14/2015 Document Reviewed: 04/12/2014 Elsevier Interactive Patient Education  2017 [employer].

## 2020-01-08 ENCOUNTER — Other Ambulatory Visit: Payer: Self-pay | Admitting: Family Medicine

## 2020-01-16 ENCOUNTER — Other Ambulatory Visit (INDEPENDENT_AMBULATORY_CARE_PROVIDER_SITE_OTHER): Payer: Medicare HMO

## 2020-01-16 DIAGNOSIS — D72829 Elevated white blood cell count, unspecified: Secondary | ICD-10-CM | POA: Diagnosis not present

## 2020-01-16 LAB — CBC WITH DIFFERENTIAL/PLATELET
Basophils Absolute: 0.1 10*3/uL (ref 0.0–0.1)
Basophils Relative: 0.8 % (ref 0.0–3.0)
Eosinophils Absolute: 0.3 10*3/uL (ref 0.0–0.7)
Eosinophils Relative: 2.1 % (ref 0.0–5.0)
HCT: 36.7 % (ref 36.0–46.0)
Hemoglobin: 12 g/dL (ref 12.0–15.0)
Lymphocytes Relative: 18.5 % (ref 12.0–46.0)
Lymphs Abs: 2.2 10*3/uL (ref 0.7–4.0)
MCHC: 32.6 g/dL (ref 30.0–36.0)
MCV: 88.9 fl (ref 78.0–100.0)
Monocytes Absolute: 1.4 10*3/uL — ABNORMAL HIGH (ref 0.1–1.0)
Monocytes Relative: 11.9 % (ref 3.0–12.0)
Neutro Abs: 8 10*3/uL — ABNORMAL HIGH (ref 1.4–7.7)
Neutrophils Relative %: 66.7 % (ref 43.0–77.0)
Platelets: 364 10*3/uL (ref 150.0–400.0)
RBC: 4.13 Mil/uL (ref 3.87–5.11)
RDW: 14.3 % (ref 11.5–15.5)
WBC: 12 10*3/uL — ABNORMAL HIGH (ref 4.0–10.5)

## 2020-01-17 ENCOUNTER — Other Ambulatory Visit: Payer: Self-pay | Admitting: Family Medicine

## 2020-01-21 ENCOUNTER — Ambulatory Visit
Admission: RE | Admit: 2020-01-21 | Discharge: 2020-01-21 | Disposition: A | Discharge status: Home / Self Care | Payer: Medicare HMO | Source: Ambulatory Visit | Attending: Family Medicine | Admitting: Family Medicine

## 2020-01-21 ENCOUNTER — Other Ambulatory Visit: Payer: Self-pay

## 2020-01-21 DIAGNOSIS — Z1231 Encounter for screening mammogram for malignant neoplasm of breast: Secondary | ICD-10-CM | POA: Diagnosis not present

## 2020-02-05 ENCOUNTER — Telehealth: Payer: Self-pay | Admitting: Family Medicine

## 2020-02-05 NOTE — Progress Notes (Signed)
  Chronic Care Management   Outreach Note  02/05/2020 Name: Tracy Williams MRN: [id number] DOB: 1946-02-11  Referred by: Midge Minium, MD Reason for referral : No chief complaint on file.   An unsuccessful telephone outreach was attempted today. The patient was referred to the pharmacist for assistance with care management and care coordination.   Follow Up Plan:   Lauretta Grill Upstream Scheduler

## 2020-02-13 ENCOUNTER — Other Ambulatory Visit: Payer: Medicare HMO

## 2020-02-21 ENCOUNTER — Telehealth: Payer: Self-pay | Admitting: Family Medicine

## 2020-02-21 NOTE — Progress Notes (Signed)
  Chronic Care Management   Outreach Note  02/21/2020 Name: Tracy Williams MRN: [id number] DOB: 02-Jun-1945  Referred by: Midge Minium, MD Reason for referral : No chief complaint on file.   A second unsuccessful telephone outreach was attempted today. The patient was referred to pharmacist for assistance with care management and care coordination.  Follow Up Plan:   Lauretta Grill Upstream Scheduler

## 2020-02-26 DIAGNOSIS — Z20822 Contact with and (suspected) exposure to covid-19: Secondary | ICD-10-CM | POA: Diagnosis not present

## 2020-03-31 ENCOUNTER — Telehealth: Payer: Self-pay | Admitting: Family Medicine

## 2020-03-31 DIAGNOSIS — H5203 Hypermetropia, bilateral: Secondary | ICD-10-CM | POA: Diagnosis not present

## 2020-03-31 DIAGNOSIS — E113291 Type 2 diabetes mellitus with mild nonproliferative diabetic retinopathy without macular edema, right eye: Secondary | ICD-10-CM | POA: Diagnosis not present

## 2020-03-31 LAB — HM DIABETES EYE EXAM

## 2020-03-31 NOTE — Progress Notes (Signed)
  Chronic Care Management   Outreach Note  03/31/2020 Name: Tracy Williams MRN: [id number] DOB: Aug 08, 1945  Referred by: Midge Minium, MD Reason for referral : No chief complaint on file.   Third unsuccessful telephone outreach was attempted today. The patient was referred to the pharmacist for assistance with care management and care coordination.   Follow Up Plan:   Lauretta Grill Upstream Scheduler

## 2020-04-01 ENCOUNTER — Encounter: Payer: Self-pay | Admitting: Emergency Medicine

## 2020-04-12 ENCOUNTER — Other Ambulatory Visit: Payer: Self-pay | Admitting: Family Medicine

## 2020-04-28 ENCOUNTER — Ambulatory Visit (INDEPENDENT_AMBULATORY_CARE_PROVIDER_SITE_OTHER): Payer: Medicare HMO | Admitting: Family Medicine

## 2020-04-28 ENCOUNTER — Other Ambulatory Visit: Payer: Self-pay

## 2020-04-28 ENCOUNTER — Encounter: Payer: Self-pay | Admitting: Family Medicine

## 2020-04-28 VITALS — BP 128/70 | HR 70 | Temp 98.0°F | Resp 20 | Ht 65.0 in | Wt 163.0 lb

## 2020-04-28 DIAGNOSIS — Z794 Long term (current) use of insulin: Secondary | ICD-10-CM | POA: Diagnosis not present

## 2020-04-28 DIAGNOSIS — E118 Type 2 diabetes mellitus with unspecified complications: Secondary | ICD-10-CM

## 2020-04-28 DIAGNOSIS — M810 Age-related osteoporosis without current pathological fracture: Secondary | ICD-10-CM | POA: Diagnosis not present

## 2020-04-28 DIAGNOSIS — Z0000 Encounter for general adult medical examination without abnormal findings: Secondary | ICD-10-CM | POA: Diagnosis not present

## 2020-04-28 LAB — HEPATIC FUNCTION PANEL
ALT: 13 U/L (ref 0–35)
AST: 16 U/L (ref 0–37)
Albumin: 4.1 g/dL (ref 3.5–5.2)
Alkaline Phosphatase: 97 U/L (ref 39–117)
Bilirubin, Direct: 0.1 mg/dL (ref 0.0–0.3)
Total Bilirubin: 0.7 mg/dL (ref 0.2–1.2)
Total Protein: 7 g/dL (ref 6.0–8.3)

## 2020-04-28 LAB — CBC WITH DIFFERENTIAL/PLATELET
Basophils Absolute: 0.1 10*3/uL (ref 0.0–0.1)
Basophils Relative: 0.5 % (ref 0.0–3.0)
Eosinophils Absolute: 0.3 10*3/uL (ref 0.0–0.7)
Eosinophils Relative: 2.5 % (ref 0.0–5.0)
HCT: 36.2 % (ref 36.0–46.0)
Hemoglobin: 12.2 g/dL (ref 12.0–15.0)
Lymphocytes Relative: 20.3 % (ref 12.0–46.0)
Lymphs Abs: 2.4 10*3/uL (ref 0.7–4.0)
MCHC: 33.7 g/dL (ref 30.0–36.0)
MCV: 87.8 fl (ref 78.0–100.0)
Monocytes Absolute: 1 10*3/uL (ref 0.1–1.0)
Monocytes Relative: 8.2 % (ref 3.0–12.0)
Neutro Abs: 8.1 10*3/uL — ABNORMAL HIGH (ref 1.4–7.7)
Neutrophils Relative %: 68.5 % (ref 43.0–77.0)
Platelets: 345 10*3/uL (ref 150.0–400.0)
RBC: 4.12 Mil/uL (ref 3.87–5.11)
RDW: 13.9 % (ref 11.5–15.5)
WBC: 11.8 10*3/uL — ABNORMAL HIGH (ref 4.0–10.5)

## 2020-04-28 LAB — LIPID PANEL
Cholesterol: 169 mg/dL (ref 0–200)
HDL: 35.5 mg/dL — ABNORMAL LOW (ref >39.00)
LDL Cholesterol: 99 mg/dL (ref 0–99)
NonHDL: 133.8
Total CHOL/HDL Ratio: 5
Triglycerides: 175 mg/dL — ABNORMAL HIGH (ref 0.0–149.0)
VLDL: 35 mg/dL (ref 0.0–40.0)

## 2020-04-28 LAB — BASIC METABOLIC PANEL
BUN: 23 mg/dL (ref 6–23)
CO2: 30 mEq/L (ref 19–32)
Calcium: 9.6 mg/dL (ref 8.4–10.5)
Chloride: 102 mEq/L (ref 96–112)
Creatinine, Ser: 1.21 mg/dL — ABNORMAL HIGH (ref 0.40–1.20)
GFR: 44.19 mL/min — ABNORMAL LOW (ref >60.00)
Glucose, Bld: 126 mg/dL — ABNORMAL HIGH (ref 70–99)
Potassium: 4.4 mEq/L (ref 3.5–5.1)
Sodium: 138 mEq/L (ref 135–145)

## 2020-04-28 LAB — TSH: TSH: 1.25 u[IU]/mL (ref 0.35–4.50)

## 2020-04-28 LAB — HEMOGLOBIN A1C: Hgb A1c MFr Bld: 7 % — ABNORMAL HIGH (ref 4.6–6.5)

## 2020-04-28 LAB — VITAMIN D 25 HYDROXY (VIT D DEFICIENCY, FRACTURES): VITD: 83.88 ng/mL (ref 30.00–100.00)

## 2020-04-28 MED ORDER — JANUMET 50-500 MG PO TABS: ORAL_TABLET | ORAL | 3 refills | Status: DC

## 2020-04-28 NOTE — Assessment & Plan Note (Signed)
Pt's PE WNL.  UTD on mammo, colonoscopy, flu, pneumonia vaccines.  Declines COVID.  Check labs.  Anticipatory guidance provided.

## 2020-04-28 NOTE — Assessment & Plan Note (Signed)
Chronic problem.  Has been doing well on current medications.  UTD on eye exam, foot exam.  Refill for Janumet sent.  Check labs.  Adjust meds prn

## 2020-04-28 NOTE — Assessment & Plan Note (Signed)
Pt has DEXA upcoming (it was rescheduled for July).  Check Vit D and replete prn.

## 2020-04-28 NOTE — Progress Notes (Signed)
   Subjective:    Patient ID: Tracy Williams, female    DOB: October 22, 1945, 74 y.o.   MRN: [id number]  HPI CPE- UTD on colonoscopy, mammo, eye exam, foot exam.  UTD on flu and pneumonia vaccines.  Declines COVID.  No concerns today- 'feeling great'  Reviewed past medical, surgical, family and social histories.   Health Maintenance  Topic Date Due  . Hepatitis C Screening  Never done  . TETANUS/TDAP  Never done  . COVID-19 Vaccine (1) 05/14/2020 (Originally 12/05/1950)  . HEMOGLOBIN A1C  06/30/2020  . FOOT EXAM  12/30/2020  . MAMMOGRAM  01/20/2021  . OPHTHALMOLOGY EXAM  03/31/2021  . COLONOSCOPY (Pts 45-13yrs Insurance coverage will need to be confirmed)  06/27/2021  . INFLUENZA VACCINE  Completed  . DEXA SCAN  Completed  . PNA vac Low Risk Adult  Completed    Patient Care Team    Relationship Specialty Notifications Start End  Midge Minium, MD PCP - General Family Medicine  10/06/11    Comment: Shirley Muscat, MD Consulting Physician Gastroenterology  04/14/15   Elsie Saas, MD Consulting Physician Orthopedic Surgery  08/12/16   Belva Crome, MD Consulting Physician Cardiology  08/12/16   Sharyne Peach, MD Consulting Physician Ophthalmology  01/07/20       Review of Systems Patient reports no vision/ hearing changes, adenopathy,fever, weight change,  persistant/recurrent hoarseness , swallowing issues, chest pain, palpitations, edema, persistant/recurrent cough, hemoptysis, dyspnea (rest/exertional/paroxysmal nocturnal), gastrointestinal bleeding (melena, rectal bleeding), abdominal pain, significant heartburn, bowel changes, GU symptoms (dysuria, hematuria, incontinence), Gyn symptoms (abnormal  bleeding, pain),  syncope, focal weakness, memory loss, numbness & tingling, skin/hair/nail changes, abnormal bruising or bleeding, anxiety, or depression.   This visit occurred during the SARS-CoV-2 public health emergency.  Safety protocols were in place, including  screening questions prior to the visit, additional usage of staff PPE, and extensive cleaning of exam room while observing appropriate contact time as indicated for disinfecting solutions.       Objective:   Physical Exam General Appearance:    Alert, cooperative, no distress, appears stated age  Head:    Normocephalic, without obvious abnormality, atraumatic  Eyes:    PERRL, conjunctiva/corneas clear, EOM's intact, fundi    benign, both eyes  Ears:    Normal TM's and external ear canals, both ears  Nose:   Deferred due to COVID  Throat:   Neck:   Supple, symmetrical, trachea midline, no adenopathy;    Thyroid: no enlargement/tenderness/nodules  Back:     Symmetric, no curvature, ROM normal, no CVA tenderness  Lungs:     Clear to auscultation bilaterally, respirations unlabored  Chest Wall:    No tenderness or deformity   Heart:    Regular rate and rhythm, S1 and S2 normal, no murmur, rub   or gallop  Breast Exam:    Deferred to mammo  Abdomen:     Soft, non-tender, bowel sounds active all four quadrants,    no masses, no organomegaly  Genitalia:    Deferred  Rectal:    Extremities:   Extremities normal, atraumatic, no cyanosis or edema  Pulses:   2+ and symmetric all extremities  Skin:   Skin color, texture, turgor normal, no rashes or lesions  Lymph nodes:   Cervical, supraclavicular, and axillary nodes normal  Neurologic:   CNII-XII intact, normal strength, sensation and reflexes    throughout          Assessment & Plan:

## 2020-04-28 NOTE — Patient Instructions (Signed)
Follow up in 3-4 months to recheck diabetes We'll notify you of your lab results and make any changes if needed Continue to work on healthy diet and regular exercise- you're doing great! Call with any questions or concerns Stay Safe!  Stay Healthy! 

## 2020-05-02 ENCOUNTER — Other Ambulatory Visit: Payer: Medicare HMO

## 2020-07-17 ENCOUNTER — Other Ambulatory Visit: Payer: Self-pay | Admitting: Family Medicine

## 2020-08-04 ENCOUNTER — Other Ambulatory Visit: Payer: Self-pay

## 2020-08-04 NOTE — Patient Outreach (Addendum)
[facility]) Care Management  08/04/2020  Andora Krull 09/06/1945 [id number]   Referral Date: 08/04/20 Referral Source: Nurseline Referral Reason: Allergic reaction   Outreach Attempt: Telephone call to patient for follow up nurse line. Patient reports she ate [place] food and thinks she had an allergic reaction to the food.  She states she went to the ED and sat in the parking lot just in case she got worse but did not go in.    Attempted to continue assessment to sign patient up for [facility] ongoing services and support.  Patient declined to continue with assessment.  Patient refused care management services at this time.     Plan: RN CM will send letter and close case.     Jone Baseman, RN, MSN [facility] Care Management Care Management Coordinator Direct Line [phone] Toll Free: [phone]  Fax: [phone]

## 2020-08-26 ENCOUNTER — Ambulatory Visit: Payer: Medicare HMO | Admitting: Family Medicine

## 2020-09-02 ENCOUNTER — Encounter: Payer: Self-pay | Admitting: Family Medicine

## 2020-09-03 ENCOUNTER — Telehealth (INDEPENDENT_AMBULATORY_CARE_PROVIDER_SITE_OTHER): Payer: Medicare HMO | Admitting: Family Medicine

## 2020-09-03 ENCOUNTER — Encounter: Payer: Self-pay | Admitting: Family Medicine

## 2020-09-03 DIAGNOSIS — Z794 Long term (current) use of insulin: Secondary | ICD-10-CM | POA: Diagnosis not present

## 2020-09-03 DIAGNOSIS — E118 Type 2 diabetes mellitus with unspecified complications: Secondary | ICD-10-CM | POA: Diagnosis not present

## 2020-09-03 NOTE — Progress Notes (Signed)
I connected with  Tracy Williams on 09/03/20 by a video enabled telemedicine application and verified that I am speaking with the correct person using two identifiers.   I discussed the limitations of evaluation and management by telemedicine. The patient expressed understanding and agreed to proceed.

## 2020-09-03 NOTE — Progress Notes (Signed)
Virtual Visit via Video   I connected with patient on 09/03/20 at  1:00 PM EDT by a video enabled telemedicine application and verified that I am speaking with the correct person using two identifiers.  Location patient: Home Location provider: [facility], Office Persons participating in the virtual visit: Patient, Provider, Carrick (Sabrina M)  I discussed the limitations of evaluation and management by telemedicine and the availability of in person appointments. The patient expressed understanding and agreed to proceed.  Subjective:   HPI:   DM- chronic problem.  On Glipizide XL $RemoveBefo'5mg'HaJLPsvOFjk$  daily and Janumet 50/$RemoveBefore'500mg'APElMfscXrZij$  daily  Last A1C 7%.  UTD on eye exam, foot exam.  Due for microalbumin.  Currently (and always) asymptomatic.  'i wouldn't know I had diabetes except for the blood work'.  No symptomatic lows.  No CP, SOB, HAs, visual changes, edema.  No abd pain, N/V.  Pt walks 'when I can'.  Not particularly following a low carb diet.  ROS:   See pertinent positives and negatives per HPI.  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 E11.9, Disp: 200 each, Rfl: 3   Alcohol  Swabs (B-D SINGLE USE SWABS REGULAR) PADS, USE ONE SWAB EACH TIME SUGARS ARE TESTED AS DIRECTED, Disp: 300 each, Rfl: 0   atorvastatin (LIPITOR) 40 MG tablet, TAKE 1 TABLET BY MOUTH EVERY DAY, Disp: 90 tablet, Rfl: 1   Blood Glucose Monitoring Suppl (ACCU-CHEK GUIDE) w/Device KIT, 1 each by Does not apply route 3 (three) times daily., Disp: 1 kit, Rfl: 3   Cholecalciferol (VITAMIN D3) 2000 units TABS, Take 2,000 Units by mouth daily. , Disp: , Rfl:    EPINEPHrine 0.3 mg/0.3 mL IJ SOAJ injection, Inject 0.3 mLs (0.3 mg total) into the muscle as needed for anaphylaxis., Disp: 1 each, Rfl: 2   glipiZIDE (GLUCOTROL XL) 5 MG 24 hr tablet, TAKE 1 TABLET BY MOUTH EVERY DAY WITH BREAKFAST, Disp: 90 tablet, Rfl: 1   glucose blood (ACCU-CHEK GUIDE) test strip, Use as instructed, Disp: 100 each, Rfl: 12   sitaGLIPtin-metformin (JANUMET) 50-500 MG tablet, TAKE 1 TABLET BY MOUTH 2 (TWO) TIMES DAILY WITH A MEAL., Disp: 60 tablet, Rfl: 3   zinc gluconate 50 MG tablet, Take 50 mg by mouth daily., Disp: , Rfl:    Ascorbic Acid (VITAMIN C PO), Take by mouth. (Patient not taking: No sig reported), Disp: , Rfl:   Allergies  Allergen Reactions   Lisinopril Swelling    Caused throat and tongue to swell (breathing was not affected, however)   Metformin And Related Nausea And Vomiting   Darvocet [Propoxyphene N-Acetaminophen] Nausea And Vomiting   Demerol [Meperidine] Nausea And Vomiting   Hydrochlorothiazide Nausea And Vomiting  Percocet [Oxycodone-Acetaminophen] Nausea And Vomiting   Vicodin [Hydrocodone-Acetaminophen] Nausea And Vomiting    Objective:   There were no vitals taken for this visit. AAOx3, NAD NCAT, EOMI No obvious CN deficits Pt is able to speak clearly, coherently without shortness of breath or increased work of breathing.  Thought process is linear.  Mood is appropriate.   Assessment and Plan:   DM- chronic problem.  Currently asymptomatic w/ hx of good control on Glipizide and Janumet.   UTD on eye exam, foot exam.  Due for microalbumin.  Check labs.  Adjust meds prn   [name], MD 09/03/2020

## 2020-09-17 ENCOUNTER — Encounter: Payer: Self-pay | Admitting: *Deleted

## 2020-09-19 ENCOUNTER — Ambulatory Visit
Admission: RE | Admit: 2020-09-19 | Discharge: 2020-09-19 | Disposition: A | Discharge status: Home / Self Care | Payer: Medicare HMO | Source: Ambulatory Visit | Attending: Family Medicine | Admitting: Family Medicine

## 2020-09-19 ENCOUNTER — Other Ambulatory Visit (INDEPENDENT_AMBULATORY_CARE_PROVIDER_SITE_OTHER): Payer: Medicare HMO

## 2020-09-19 ENCOUNTER — Other Ambulatory Visit: Payer: Self-pay

## 2020-09-19 DIAGNOSIS — M810 Age-related osteoporosis without current pathological fracture: Secondary | ICD-10-CM

## 2020-09-19 DIAGNOSIS — Z794 Long term (current) use of insulin: Secondary | ICD-10-CM

## 2020-09-19 DIAGNOSIS — M8589 Other specified disorders of bone density and structure, multiple sites: Secondary | ICD-10-CM | POA: Diagnosis not present

## 2020-09-19 DIAGNOSIS — E118 Type 2 diabetes mellitus with unspecified complications: Secondary | ICD-10-CM

## 2020-09-19 DIAGNOSIS — Z780 Asymptomatic menopausal state: Secondary | ICD-10-CM | POA: Diagnosis not present

## 2020-09-19 LAB — BASIC METABOLIC PANEL
BUN: 21 mg/dL (ref 6–23)
CO2: 28 mEq/L (ref 19–32)
Calcium: 9.3 mg/dL (ref 8.4–10.5)
Chloride: 104 mEq/L (ref 96–112)
Creatinine, Ser: 1.26 mg/dL — ABNORMAL HIGH (ref 0.40–1.20)
GFR: 41.97 mL/min — ABNORMAL LOW (ref >60.00)
Glucose, Bld: 106 mg/dL — ABNORMAL HIGH (ref 70–99)
Potassium: 4.3 mEq/L (ref 3.5–5.1)
Sodium: 140 mEq/L (ref 135–145)

## 2020-09-19 LAB — MICROALBUMIN / CREATININE URINE RATIO
Creatinine,U: 208 mg/dL
Microalb Creat Ratio: 0.5 mg/g (ref 0.0–30.0)
Microalb, Ur: 1 mg/dL (ref 0.0–1.9)

## 2020-09-19 LAB — HEMOGLOBIN A1C: Hgb A1c MFr Bld: 7 % — ABNORMAL HIGH (ref 4.6–6.5)

## 2020-10-10 ENCOUNTER — Other Ambulatory Visit: Payer: Self-pay | Admitting: Family Medicine

## 2020-10-27 ENCOUNTER — Telehealth: Payer: Self-pay | Admitting: Family Medicine

## 2020-10-27 NOTE — Chronic Care Management (AMB) (Signed)
  Chronic Care Management   Note  10/27/2020 Name: Tracy Williams MRN: [id number] DOB: 1945-06-10  Tracy Williams is a 75 y.o. year old female who is a primary care patient of Birdie Riddle, Aundra Millet, MD. I reached out to Aileen Fass by phone today in response to a referral sent by Ms. Tracy Williams's PCP, Midge Minium, MD.   Tracy Williams was given information about Chronic Care Management services today including:  CCM service includes personalized support from designated clinical staff supervised by her physician, including individualized plan of care and coordination with other care providers 24/7 contact phone numbers for assistance for urgent and routine care needs. Service will only be billed when office clinical staff spend 20 minutes or more in a month to coordinate care. Only one practitioner may furnish and bill the service in a calendar month. The patient may stop CCM services at any time (effective at the end of the month) by phone call to the office staff.   Patient agreed to services and verbal consent obtained.   Follow up plan:   Tatjana Secretary/administrator

## 2020-11-14 ENCOUNTER — Other Ambulatory Visit: Payer: Self-pay | Admitting: Family Medicine

## 2020-11-25 ENCOUNTER — Telehealth: Payer: Self-pay

## 2020-11-25 NOTE — Progress Notes (Signed)
Chronic Care Management Pharmacy Assistant   Name: Gayatri Teasdale  MRN: [id number] DOB: Mar 03, 1946  Tracy Williams Shutters is an 75 y.o. year old female who presents for her initial CCM visit with the clinical pharmacist.  Reason for Encounter: Chart Prep / Initial Visit with CPP    Recent office visits:  09/03/20 Neena Rhymes, MD (PCP) - Family Medicine (Video Visit) - Diabetes Type 2 - Labs were ordered. No medication changes. Follow up not indicated.   Recent consult visits:  None noted.   Hospital visits:  None in previous 6 months  Medications: Outpatient Encounter Medications as of 11/25/2020  Medication Sig   ACCU-CHEK SOFTCLIX LANCETS lancets Use as instructed to test sugars 2-3 times daily. E11.9   Alcohol Swabs (B-D SINGLE USE SWABS REGULAR) PADS USE ONE SWAB EACH TIME SUGARS ARE TESTED AS DIRECTED   Ascorbic Acid (VITAMIN C PO) Take by mouth. (Patient not taking: No sig reported)   atorvastatin (LIPITOR) 40 MG tablet TAKE 1 TABLET BY MOUTH EVERY DAY   Blood Glucose Monitoring Suppl (ACCU-CHEK GUIDE) w/Device KIT 1 each by Does not apply route 3 (three) times daily.   Cholecalciferol (VITAMIN D3) 2000 units TABS Take 2,000 Units by mouth daily.    EPINEPHrine 0.3 mg/0.3 mL IJ SOAJ injection Inject 0.3 mLs (0.3 mg total) into the muscle as needed for anaphylaxis.   glipiZIDE (GLUCOTROL XL) 5 MG 24 hr tablet TAKE 1 TABLET BY MOUTH EVERY DAY WITH BREAKFAST   glucose blood (ACCU-CHEK GUIDE) test strip Use as instructed   sitaGLIPtin-metformin (JANUMET) 50-500 MG tablet TAKE 1 TABLET BY MOUTH TWICE A DAY WITH A MEAL   zinc gluconate 50 MG tablet Take 50 mg by mouth daily.   No facility-administered encounter medications on file as of 11/25/2020.    Have you seen any other providers since your last visit? No  Any changes in your medications or health? No  Any side effects from any medications? No  Do you have an symptoms or problems not managed by your  medications? No  Any concerns about your health right now? Yes. 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 She is off this week and [place]  Has your provider asked that you check blood pressure, blood sugar, or follow special diet at home? Yes. She checks her glucose at home but not regularly.  Do you get any type of exercise on a regular basis? No  Can you think of a goal you would like to reach for your health? Patient would like to work on her energy level and also work on lowering her AIC more.  Do you have any problems getting your medications? No  Is there anything that you would like to discuss during the appointment? Patient did not have any additional she wanted to add at this time.    *Please bring medications and supplements to appointment   *Patient confirmed appointment date and time for 12/03/20 phone visit at 2pm    Care Gaps  Colonoscopy: due 06/27/21 DEXA: done 09/19/20 Influenza: due 10/20/20 Mammogram: due 01/20/21 Diabetes Eye Exam: due 03/31/21 Diabetes Foot Exam: due 12/30/20 Annual Well Visit: done 04/28/20 PAP: N/A Tetanus: never Zoster: never HIV screening: never PSA: N/A   Star Rating Drugs: Atorvastatin (LIPITOR) 40 MG tablet - last filled 07/17/20 90 days (pt reported no gaps bottles not available at the time)  Glipizide (GLUCOTROL XL) 5 MG 24 hr tablet - last filled 07/10/20 90 days (pt reported no gaps bottles not available at the time) Stagflation-metformin (JANUMET) 50-500 MG tablet - last filled 07/31/20 30 days (pt reported no gaps bottles not available at the time)  Future Appointments  Date Time Provider [place]  12/03/2020  2:00 PM [facility] CCM PHARMACIST [facility]  01/12/2021  1:30 PM [facility] HEALTH COACH [facility] [place] [place] Clinical Pharmacist Assistant  [phone]  Time Spent: 58 minutes

## 2020-12-03 ENCOUNTER — Ambulatory Visit (INDEPENDENT_AMBULATORY_CARE_PROVIDER_SITE_OTHER): Payer: Medicare HMO

## 2020-12-03 DIAGNOSIS — Z794 Long term (current) use of insulin: Secondary | ICD-10-CM

## 2020-12-03 DIAGNOSIS — E785 Hyperlipidemia, unspecified: Secondary | ICD-10-CM

## 2020-12-03 MED ORDER — JANUMET 50-500 MG PO TABS: 1.0000 | ORAL_TABLET | Freq: Every day | ORAL | 3 refills | Status: DC

## 2020-12-03 NOTE — Progress Notes (Signed)
Chronic Care Management Pharmacy Note  12/03/2020 Name:  Tracy Williams MRN:  [id number] DOB:  December 02, 1945  Summary: -No Rx changes, patient would be interested in flu shot when here 12/2020 with health coach  -Consider CKD staging - GFR 40-45 last 3+ labs  Subjective: Tracy Williams is an 75 y.o. year old female who is a primary patient of Tabori, Aundra Millet, MD.  The CCM team was consulted for assistance with disease management and care coordination needs.    Engaged with patient by telephone for initial visit in response to provider referral for pharmacy case management and/or care coordination services.   Consent to Services:  The patient was given the following information about Chronic Care Management services today, agreed to services, and gave verbal consent: 1. CCM service includes personalized support from designated clinical staff supervised by the primary care provider, including individualized plan of care and coordination with other care providers 2. 24/7 contact phone numbers for assistance for urgent and routine care needs. 3. Service will only be billed when office clinical staff spend 20 minutes or more in a month to coordinate care. 4. Only one practitioner may furnish and bill the service in a calendar month. 5.The patient may stop CCM services at any time (effective at the end of the month) by phone call to the office staff. 6. The patient will be responsible for cost sharing (co-pay) of up to 20% of the service fee (after annual deductible is met). Patient agreed to services and consent obtained.  Patient Care Team: Midge Minium, MD as PCP - General (Family Medicine) Clarene Essex, MD as Consulting Physician (Gastroenterology) Elsie Saas, MD as Consulting Physician (Orthopedic Surgery) Belva Crome, MD as Consulting Physician (Cardiology) Sharyne Peach, MD as Consulting Physician (Ophthalmology) Madelin Rear, [facility] as Pharmacist (Pharmacist)    Recent office visits:  09/03/20 [name], MD (PCP) - Family Medicine (Video Visit) - Diabetes Type 2 - Labs were ordered. No medication changes. Follow up not indicated.    Recent consult visits:  None noted.    Hospital visits:  None in previous 6 months  Objective:  Lab Results  Component Value Date   CREATININE 1.26 (H) 09/19/2020   CREATININE 1.21 (H) 04/28/2020   CREATININE 1.21 (H) 12/31/2019    Lab Results  Component Value Date   HGBA1C 7.0 (H) 09/19/2020   Last diabetic Eye exam:  Lab Results  Component Value Date/Time   HMDIABEYEEXA Retinopathy (A) 03/31/2020 12:00 AM    Last diabetic Foot exam: No results found for: HMDIABFOOTEX      Component Value Date/Time   CHOL 169 04/28/2020 1300   TRIG 175.0 (H) 04/28/2020 1300   HDL 35.50 (L) 04/28/2020 1300   CHOLHDL 5 04/28/2020 1300   VLDL 35.0 04/28/2020 1300   LDLCALC 99 04/28/2020 1300   LDLDIRECT 98.0 07/04/2019 1113    Hepatic Function Latest Ref Rng & Units 04/28/2020 12/31/2019 07/04/2019  Total Protein 6.0 - 8.3 g/dL 7.0 6.7 6.6  Albumin 3.5 - 5.2 g/dL 4.1 4.1 4.3  AST 0 - 37 U/L _0 ALT 0 - 35 U/L _1 Alk Phosphatase 39 - 117 U/L 97 98 91  Total Bilirubin 0.2 - 1.2 mg/dL 0.7 0.8 0.6  Bilirubin, Direct 0.0 - 0.3 mg/dL 0.1 0.1 0.1    Lab Results  Component Value Date/Time   TSH 1.25 04/28/2020 01:00 PM   TSH 1.08 12/31/2019 10:34 AM    CBC Latest  Ref Rng & Units 04/28/2020 01/16/2020 12/31/2019  WBC 4.0 - 10.5 K/uL 11.8(H) 12.0(H) 13.4(H)  Hemoglobin 12.0 - 15.0 g/dL 12.2 12.0 12.3  Hematocrit 36.0 - 46.0 % 36.2 36.7 37.0  Platelets 150.0 - 400.0 K/uL 345.0 364.0 354.0    Lab Results  Component Value Date/Time   VD25OH 83.88 04/28/2020 01:00 PM   VD25OH 69.98 12/31/2019 10:34 AM    Clinical ASCVD:  The 10-year ASCVD risk score (Arnett DK, et al., 2019) is: 25.8%   Values used to calculate the score:     Age: 86 years     Sex: Female     Is Non-Hispanic African  American: No     Diabetic: Yes     Tobacco smoker: No     Systolic Blood Pressure: 626 mmHg     Is BP treated: No     HDL Cholesterol: 35.5 mg/dL     Total Cholesterol: 169 mg/dL    Social History   Tobacco Use  Smoking Status Never  Smokeless Tobacco Never   BP Readings from Last 3 Encounters:  04/28/20 128/70  12/31/19 123/80  07/04/19 131/80   Pulse Readings from Last 3 Encounters:  04/28/20 70  12/31/19 86  07/04/19 79   Wt Readings from Last 3 Encounters:  04/28/20 163 lb (73.9 kg)  01/07/20 162 lb (73.5 kg)  12/31/19 162 lb 4 oz (73.6 kg)    Assessment: Review of patient past medical history, allergies, medications, health status, including review of consultants reports, laboratory and other test data, was performed as part of comprehensive evaluation and provision of chronic care management services.   SDOH:  (Social Determinants of Health) assessments and interventions performed: Yes   CCM Care Plan  Allergies  Allergen Reactions   Lisinopril Swelling    Caused throat and tongue to swell (breathing was not affected, however)   Metformin And Related Nausea And Vomiting   Darvocet [Propoxyphene N-Acetaminophen] Nausea And Vomiting   Demerol [Meperidine] Nausea And Vomiting   Hydrochlorothiazide Nausea And Vomiting   Percocet [Oxycodone-Acetaminophen] Nausea And Vomiting   Vicodin [Hydrocodone-Acetaminophen] Nausea And Vomiting    Medications Reviewed Today     Reviewed by Madelin Rear, [facility] (Pharmacist) on 12/03/20 at 1442  Med List Status: <None>   Medication Order Taking? Sig Documenting Provider Last Dose Status Informant  ACCU-CHEK SOFTCLIX LANCETS lancets [id number] Yes Use as instructed to test sugars 2-3 times daily. E11.9 Midge Minium, MD Taking Active Self  Alcohol Swabs (B-D SINGLE USE SWABS REGULAR) PADS [id number] Yes USE ONE [facility] TIME SUGARS ARE TESTED AS DIRECTED Midge Minium, MD Taking Active Self  Ascorbic Acid (VITAMIN C  PO) [id number] Yes Take by mouth. [provider] Taking Active   atorvastatin (LIPITOR) 40 MG tablet [id number] Yes TAKE 1 TABLET BY MOUTH EVERY DAY Midge Minium, MD Taking Active   Blood Glucose Monitoring Suppl (ACCU-CHEK GUIDE) w/Device KIT [id number] Yes 1 each by Does not apply route 3 (three) times daily. Midge Minium, MD Taking Active Self  Cholecalciferol (VITAMIN D3) 2000 units TABS [id number] Yes Take 2,000 Units by mouth daily.  [provider] Taking Active Self  EPINEPHrine 0.3 mg/0.3 mL IJ SOAJ injection [id number] Yes Inject 0.3 mLs (0.3 mg total) into the muscle as needed for anaphylaxis. Ward, Delice Bison, DO Taking Active   glipiZIDE (GLUCOTROL XL) 5 MG 24 hr tablet [id number] Yes TAKE 1 TABLET BY MOUTH EVERY DAY WITH BREAKFAST Midge Minium, MD Taking Active  glucose blood (ACCU-CHEK GUIDE) test strip [id number] Yes Use as instructed Midge Minium, MD Taking Active Self  sitaGLIPtin-metformin (JANUMET) 50-500 MG tablet [id number] Yes TAKE 1 TABLET BY MOUTH TWICE A DAY WITH A MEAL Midge Minium, MD Taking Active   zinc gluconate 50 MG tablet [id number] Yes Take 50 mg by mouth daily. 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   Type 2 diabetes mellitus with complication, with long-term current use of insulin ([place]) 06/09/2012   General medical examination 11/26/2010   Colon cancer screening 11/26/2010   Hyperlipidemia 11/26/2010    Immunization History  Administered Date(s) Administered   Fluad Quad(high Dose 65+) 01/03/2019,  12/31/2019   HiB (PRP-T) 01/29/2016   Influenza, High Dose Seasonal PF 12/27/2016   Meningococcal Mcv4o 01/29/2016   Pneumococcal Conjugate-13 05/08/2014   Pneumococcal Polysaccharide-23 06/27/2012   Conditions to be addressed/monitored: HLD, DMII, and Osteopenia   Care Plan : ccm pharmacy  Updates made by Madelin Rear, [place] since 12/03/2020 12:00 AM     Problem: hld dmii osteoporosis   Priority: High     Long-Range Goal: Disease management   Start Date: 12/03/2020  Expected End Date: 12/03/2021  This Visit's Progress: On track  Priority: High  Note:    Current Barriers:  Unable to independently afford treatment regimen  Pharmacist Clinical Goal(s):  Patient will verbalize ability to afford treatment regimen achieve adherence to monitoring guidelines and medication adherence to achieve therapeutic efficacy through collaboration with PharmD and provider.   Interventions: 1:1 collaboration with Midge Minium, MD regarding development and update of comprehensive plan of care as evidenced by provider attestation and co-signature Inter-disciplinary care team collaboration (see longitudinal plan of care) Comprehensive medication review performed; medication list updated in electronic medical record  Hyperlipidemia: (LDL goal < 100) -Controlled -Current treatment: Atorvastatin 40 mg once  -Reviewed for side effects - no problems noted   -Educated on Cholesterol goals;  Importance of limiting foods high in cholesterol; -Counseled on diet and exercise extensively Recommended to continue current medication  Diabetes (A1c goal <7%) -Controlled -No ACEi/ARB due to angioedema with lisinopril  -Current medications: Janumet 50-533m once daily with meal  Glipizide xl 518mdaily  -Medications previously tried: Metformin (n/v)  -Current home glucose readings fasting glucose: no recent numbers post prandial glucose: n/a -Denies hypoglycemic/hyperglycemic symptoms -Current  meal patterns: some processed foods recently. Sandwich or salad. Bagel w/ peanut butter.  -Current exercise: active with work.  -Educated on Complications of diabetes including kidney damage, retinal damage, and cardiovascular disease; Prevention and management of hypoglycemic episodes; -Counseled to check feet daily and get yearly eye exams -Counseled on diet and exercise extensively Recommended to continue current medication  Care Gaps -plans to get flu shot at awv   Patient Goals/Self-Care Activities Patient will:  - take medications as prescribed  Follow Up Plan: 6 month DM call, 12 month CPP f/u telephone call   Medication Assistance: None required.  Patient affirms current coverage meets needs.  Patient's preferred pharmacy is:  [id number]  [address] [place] [address] Phone: [phone] Fax: [phone]  [facility] - [place], [place] [address] Phone: [phone] Fax: [phone]  Follow Up:  Patient agrees to Care Plan and Follow-up.    Future Appointments  Date Time Provider [place]  01/12/2021  1:30 PM [facility] HEALTH COACH [facility]  12/09/2021  1:00 PM [facility] CCM PHARMACIST [facility]   Madelin Rear, PharmD, CPP Clinical Pharmacist Practitioner  [phone]

## 2020-12-03 NOTE — Patient Instructions (Addendum)
Ms. Vitelli,  Thank you for talking with me today. I have included our care plan/goals in the following pages.   Please review and call me at [phone] with any questions.  Thanks! Ellin Mayhew, PharmD, CPP Clinical Pharmacist Practitioner  [phone]  Care Plan : ccm pharmacy  Updates made by Madelin Rear, [facility] since 12/03/2020 12:00 AM     Problem: hld dmii osteoporosis   Priority: High     Long-Range Goal: Disease management   Start Date: 12/03/2020  Expected End Date: 12/03/2021  This Visit's Progress: On track  Priority: High  Note:    Current Barriers:  Unable to independently afford treatment regimen  Pharmacist Clinical Goal(s):  Patient will verbalize ability to afford treatment regimen achieve adherence to monitoring guidelines and medication adherence to achieve therapeutic efficacy through collaboration with PharmD and provider.   Interventions: 1:1 collaboration with Midge Minium, MD regarding development and update of comprehensive plan of care as evidenced by provider attestation and co-signature Inter-disciplinary care team collaboration (see longitudinal plan of care) Comprehensive medication review performed; medication list updated in electronic medical record  Hyperlipidemia: (LDL goal < 100) -Controlled -Current treatment: Atorvastatin 40 mg once  -Reviewed for side effects - no problems noted   -Educated on Cholesterol goals;  Importance of limiting foods high in cholesterol; -Counseled on diet and exercise extensively Recommended to continue current medication  Diabetes (A1c goal <7%) -Controlled -No ACEi/ARB due to angioedema with lisinopril  -Current medications: Janumet 50-500mg  once daily with meal  Glipizide xl 5mg  daily  -Medications previously tried: Metformin (n/v)  -Current home glucose readings fasting glucose: no recent numbers post prandial glucose: n/a -Denies hypoglycemic/hyperglycemic  symptoms -Current meal patterns: some processed foods recently. Sandwich or salad. Bagel w/ peanut butter.  -Current exercise: active with work.  -Educated on Complications of diabetes including kidney damage, retinal damage, and cardiovascular disease; Prevention and management of hypoglycemic episodes; -Counseled to check feet daily and get yearly eye exams -Counseled on diet and exercise extensively Recommended to continue current medication  Care Gaps -plans to get flu shot at awv   Patient Goals/Self-Care Activities Patient will:  - take medications as prescribed  Follow Up Plan: 6 month DM call, 12 month CPP f/u telephone call   Medication Assistance: None required.  Patient affirms current coverage meets needs.  Patient's preferred pharmacy [facility]:  [id number] [address], [place] [address] Phone: [phone] Fax: [phone]  [facility] ([facility] - [place], [place] [address] Phone: [phone] Fax: [phone]  Follow Up:  Patient agrees to Care Plan and Follow-up.    The patient was given the following information about Chronic Care Management services today, agreed to services, and gave verbal consent: 1. CCM service includes personalized support from designated clinical staff supervised by the primary care provider, including individualized plan of care and coordination with other care providers 2. 24/7 contact phone numbers for assistance for urgent and routine care needs. 3. Service will only be billed when office clinical staff spend 20 minutes or more in a month to coordinate care. 4. Only one practitioner may furnish and bill the service in a calendar month. 5.The patient may stop CCM services at any time (effective at the end of the month) by phone call to the office staff. 6. The patient will be  responsible for  cost sharing (co-pay) of up to 20% of the service fee (after annual deductible is met). Patient agreed to services and consent obtained.  The patient verbalized understanding of instructions provided today and agreed to receive a MyChart copy of patient instruction and/or educational materials. Telephone follow up appointment with pharmacy team member scheduled for: See next appointment with "Care Management Staff" under "What's Next" below.

## 2020-12-19 DIAGNOSIS — E118 Type 2 diabetes mellitus with unspecified complications: Secondary | ICD-10-CM

## 2020-12-19 DIAGNOSIS — Z794 Long term (current) use of insulin: Secondary | ICD-10-CM | POA: Diagnosis not present

## 2020-12-19 DIAGNOSIS — E785 Hyperlipidemia, unspecified: Secondary | ICD-10-CM

## 2021-01-12 ENCOUNTER — Ambulatory Visit: Payer: Medicare HMO

## 2021-01-22 ENCOUNTER — Other Ambulatory Visit: Payer: Self-pay | Admitting: Family Medicine

## 2021-02-03 ENCOUNTER — Telehealth (INDEPENDENT_AMBULATORY_CARE_PROVIDER_SITE_OTHER): Payer: Medicare HMO | Admitting: Registered Nurse

## 2021-02-03 DIAGNOSIS — B9689 Other specified bacterial agents as the cause of diseases classified elsewhere: Secondary | ICD-10-CM

## 2021-02-03 DIAGNOSIS — J019 Acute sinusitis, unspecified: Secondary | ICD-10-CM | POA: Diagnosis not present

## 2021-02-03 MED ORDER — DOXYCYCLINE HYCLATE 100 MG PO TABS: 100.0000 mg | ORAL_TABLET | Freq: Two times a day (BID) | ORAL | 0 refills | Status: DC

## 2021-02-03 MED ORDER — AZELASTINE HCL 0.1 % NA SOLN: 1.0000 | Freq: Two times a day (BID) | NASAL | 12 refills | Status: DC

## 2021-02-03 MED ORDER — DM-GUAIFENESIN ER 30-600 MG PO TB12: 1.0000 | ORAL_TABLET | Freq: Two times a day (BID) | ORAL | 0 refills | Status: DC

## 2021-02-03 NOTE — Progress Notes (Signed)
Telemedicine Encounter- SOAP NOTE Established Patient  This telephone encounter was conducted with the patient's (or proxy's) verbal consent via audio telecommunications: yes/no: Yes Patient was instructed to have this encounter in a suitably private space; and to only have persons present to whom they give permission to participate. In addition, patient identity was confirmed by use of name plus two identifiers (DOB and address).  I discussed the limitations, risks, security and privacy concerns of performing an evaluation and management service by telephone and the availability of in person appointments. I also discussed with the patient that there may be a patient responsible charge related to this service. The patient expressed understanding and agreed to proceed.  I spent a total of  talking with the patient or their proxy.  Patient at home Provider in office  Participants: Kathrin Ruddy, NP and [place]  Chief Complaint  Patient presents with   Nasal Congestion    Pt reports day 15 of congestion, productive cough, sinus drainage, reports pain in teeth that had come and gone now, reports better compared to a few days ago, thinks sinus infection     Subjective   Tracy Williams is a 75 y.o. established patient. Telephone visit today for nasal congesiton  HPI Onset 2+ weeks ago Nasal congestion, pain into jaw and teeth Pnd, productive cough with thick discolored mucus  Denies shob, doe, chest pain, dizziness/lightheadedness, fever  Covid test x 2 were negative No covid vaccine No vaccine against flu this year.   Has been taking some dayquil with limited relief, tylenol with some relief.   Otherwise no concerns at this time.   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   Type 2 diabetes mellitus with complication, with long-term current use of insulin ([place]) 06/09/2012   General medical examination 11/26/2010   Colon cancer screening 11/26/2010   Hyperlipidemia 11/26/2010    Past Medical History:  Diagnosis Date   Allergy    Anemia    hx   Angio-edema    Arthritis    Diabetes mellitus without complication ([place])    Type II   Dysrhythmia    Hx: of palpitations in 2010 only   GERD (gastroesophageal reflux disease)    Hypertension    Pancreatic mass    /notes 02/25/2016   PONV (postoperative nausea and vomiting)    pt reports no PONV after biopsy on 12/24/15   Right knee DJD    Stress incontinence    Urinary frequency    Urinary urgency    Wears glasses     Current Outpatient Medications  Medication Sig Dispense Refill   ACCU-CHEK SOFTCLIX LANCETS lancets Use as instructed to test sugars 2-3 times daily. E11.9 200 each 3   Alcohol Swabs (B-D SINGLE USE SWABS REGULAR) PADS USE ONE SWAB EACH TIME SUGARS ARE TESTED AS DIRECTED 300 each 0   Ascorbic Acid (VITAMIN C PO) Take by mouth.     atorvastatin (LIPITOR) 40 MG tablet TAKE 1 TABLET BY MOUTH EVERY DAY 90 tablet 1   azelastine (ASTELIN) 0.1 % nasal spray Place 1 spray into both nostrils 2 (two) times daily. Use in  each nostril as directed 30 mL 12   Blood Glucose Monitoring Suppl (ACCU-CHEK GUIDE) w/Device KIT 1 each by Does not apply route 3 (three) times daily. 1 kit 3   Cholecalciferol (VITAMIN D3) 2000 units TABS Take 2,000 Units by mouth daily.      dextromethorphan-guaiFENesin (MUCINEX DM) 30-600 MG 12hr tablet Take 1 tablet by mouth 2 (two) times daily. 20 tablet 0   doxycycline (VIBRA-TABS) 100 MG tablet Take 1 tablet (100 mg total) by mouth 2 (two) times daily. 14 tablet 0   EPINEPHrine 0.3 mg/0.3 mL IJ SOAJ  injection Inject 0.3 mLs (0.3 mg total) into the muscle as needed for anaphylaxis. 1 each 2   glipiZIDE (GLUCOTROL XL) 5 MG 24 hr tablet TAKE 1 TABLET BY MOUTH EVERY DAY WITH BREAKFAST 90 tablet 1   glucose blood (ACCU-CHEK GUIDE) test strip Use as instructed 100 each 12   sitaGLIPtin-metformin (JANUMET) 50-500 MG tablet Take 1 tablet by mouth daily. 90 tablet 3   zinc gluconate 50 MG tablet Take 50 mg by mouth daily.     No current facility-administered medications for this visit.    Allergies  Allergen Reactions   Lisinopril Swelling    Caused throat and tongue to swell (breathing was not affected, however)   Metformin And Related Nausea And Vomiting   Darvocet [Propoxyphene N-Acetaminophen] Nausea And Vomiting   Demerol [Meperidine] Nausea And Vomiting   Hydrochlorothiazide Nausea And Vomiting   Percocet [Oxycodone-Acetaminophen] Nausea And Vomiting   Vicodin [Hydrocodone-Acetaminophen] Nausea And Vomiting    Social History   Socioeconomic History   Marital status: Widowed    Spouse name: Not on file   Number of children: Not on file   Years of education: Not on file   Highest education level: Not on file  Occupational History   Occupation: retired  Tobacco Use   Smoking status: Never   Smokeless tobacco: Never  Vaping Use   Vaping Use: Never used  Substance and Sexual Activity   Alcohol use: No   Drug use: No   Sexual activity: Not on file  Other Topics Concern   Not on file  Social History Narrative   Not on file   Social Determinants of Health   Financial Resource Strain: Not on file  Food Insecurity: Not on file  Transportation Needs: Not on file  Physical Activity: Not on file  Stress: Not on file  Social Connections: Not on file  Intimate Partner Violence: Not on file    Review of Systems  Constitutional: Negative.   HENT:  Positive for congestion, sinus pain and sore throat.   Eyes: Negative.   Respiratory:  Positive for cough. Negative for  hemoptysis, sputum production, shortness of breath and wheezing.   Cardiovascular: Negative.   Gastrointestinal: Negative.   Genitourinary: Negative.   Musculoskeletal: Negative.   Skin: Negative.   Neurological: Negative.   Endo/Heme/Allergies: Negative.   Psychiatric/Behavioral: Negative.    All other systems reviewed and are negative.  Objective   Vitals as reported by the patient: There were no vitals filed for this visit.  [place] was seen today for nasal congestion.  Diagnoses and all orders for this visit:  Acute bacterial sinusitis -     doxycycline (VIBRA-TABS) 100 MG tablet; Take 1 tablet (100 mg total) by mouth 2 (two) times daily. -     dextromethorphan-guaiFENesin (MUCINEX DM) 30-600 MG 12hr tablet; Take 1 tablet by mouth 2 (two) times daily. -     azelastine (ASTELIN) 0.1 %  nasal spray; Place 1 spray into both nostrils 2 (two) times daily. Use in each nostril as directed   PLAN Given duration, tx as bacterial. Doxycycline 100mg  po bid ac.  Mucinex and azelastine for symptom relief Return if worsening or failing to improve Patient encouraged to call clinic with any questions, comments, or concerns.  I discussed the assessment and treatment plan with the patient. The patient was provided an opportunity to ask questions and all were answered. The patient agreed with the plan and demonstrated an understanding of the instructions.   The patient was advised to call back or seek an in-person evaluation if the symptoms worsen or if the condition fails to improve as anticipated.  I provided 15 minutes of non-face-to-face time during this encounter.  Maximiano Coss, NP  Primary Care at [facility]

## 2021-02-05 ENCOUNTER — Ambulatory Visit (INDEPENDENT_AMBULATORY_CARE_PROVIDER_SITE_OTHER): Payer: Medicare HMO

## 2021-02-05 DIAGNOSIS — Z0000 Encounter for general adult medical examination without abnormal findings: Secondary | ICD-10-CM

## 2021-02-05 NOTE — Patient Instructions (Signed)
Tracy Williams , Thank you for taking time to come for your Medicare Wellness Visit. I appreciate your ongoing commitment to your health goals. Please review the following plan we discussed and let me know if I can assist you in the future.   Screening recommendations/referrals: Colonoscopy: no longer required  Mammogram: no longer required  Bone Density: 09/19/2020 Recommended yearly ophthalmology/optometry visit for glaucoma screening and checkup Recommended yearly dental visit for hygiene and checkup  Vaccinations: Influenza vaccine: due  Pneumococcal vaccine: completed  Tdap vaccine: due will obtain  Shingles vaccine: will consider     Advanced directives: will provide copies   Conditions/risks identified: none   Next appointment: none    Preventive Care 13 Years and Older, Female Preventive care refers to lifestyle choices and visits with your health care provider that can promote health and wellness. What does preventive care include? A yearly physical exam. This is also called an annual well check. Dental exams once or twice a year. Routine eye exams. Ask your health care provider how often you should have your eyes checked. Personal lifestyle choices, including: Daily care of your teeth and gums. Regular physical activity. Eating a healthy diet. Avoiding tobacco and drug use. Limiting alcohol use. Practicing safe sex. Taking low-dose aspirin every day. Taking vitamin and mineral supplements as recommended by your health care provider. What happens during an annual well check? The services and screenings done by your health care provider during your annual well check will depend on your age, overall health, lifestyle risk factors, and family history of disease. Counseling  Your health care provider may ask you questions about your: Alcohol use. Tobacco use. Drug use. Emotional well-being. Home and relationship well-being. Sexual activity. Eating habits. History of  falls. Memory and ability to understand (cognition). Work and work Statistician. Reproductive health. Screening  You may have the following tests or measurements: Height, weight, and BMI. Blood pressure. Lipid and cholesterol levels. These may be checked every 5 years, or more frequently if you are over 66 years old. Skin check. Lung cancer screening. You may have this screening every year starting at age 57 if you have a 30-pack-year history of smoking and currently smoke or have quit within the past 15 years. Fecal occult blood test (FOBT) of the stool. You may have this test every year starting at age 64. Flexible sigmoidoscopy or colonoscopy. You may have a sigmoidoscopy every 5 years or a colonoscopy every 10 years starting at age 59. Hepatitis C blood test. Hepatitis B blood test. Sexually transmitted disease (STD) testing. Diabetes screening. This is done by checking your blood sugar (glucose) after you have not eaten for a while (fasting). You may have this done every 1-3 years. Bone density scan. This is done to screen for osteoporosis. You may have this done starting at age 32. Mammogram. This may be done every 1-2 years. Talk to your health care provider about how often you should have regular mammograms. Talk with your health care provider about your test results, treatment options, and if necessary, the need for more tests. Vaccines  Your health care provider may recommend certain vaccines, such as: Influenza vaccine. This is recommended every year. Tetanus, diphtheria, and acellular pertussis (Tdap, Td) vaccine. You may need a Td booster every 10 years. Zoster vaccine. You may need this after age 55. Pneumococcal 13-valent conjugate (PCV13) vaccine. One dose is recommended after age 31. Pneumococcal polysaccharide (PPSV23) vaccine. One dose is recommended after age 12. Talk to your health care  provider about which screenings and vaccines you need and how often you need  them. This information is not intended to replace advice given to you by your health care provider. Make sure you discuss any questions you have with your health care provider. Document Released: 04/04/2015 Document Revised: 11/26/2015 Document Reviewed: 01/07/2015 Elsevier Interactive Patient Education  2017 [employer] Prevention in the Home Falls can cause injuries. They can happen to people of all ages. There are many things you can do to make your home safe and to help prevent falls. What can I do on the outside of my home? Regularly fix the edges of walkways and driveways and fix any cracks. Remove anything that might make you trip as you walk through a door, such as a raised step or threshold. Trim any bushes or trees on the path to your home. Use bright outdoor lighting. Clear any walking paths of anything that might make someone trip, such as rocks or tools. Regularly check to see if handrails are loose or broken. Make sure that both sides of any steps have handrails. Any raised decks and porches should have guardrails on the edges. Have any leaves, snow, or ice cleared regularly. Use sand or salt on walking paths during winter. Clean up any spills in your garage right away. This includes oil or grease spills. What can I do in the bathroom? Use night lights. Install grab bars by the toilet and in the tub and shower. Do not use towel bars as grab bars. Use non-skid mats or decals in the tub or shower. If you need to sit down in the shower, use a plastic, non-slip stool. Keep the floor dry. Clean up any water that spills on the floor as soon as it happens. Remove soap buildup in the tub or shower regularly. Attach bath mats securely with double-sided non-slip rug tape. Do not have throw rugs and other things on the floor that can make you trip. What can I do in the bedroom? Use night lights. Make sure that you have a light by your bed that is easy to reach. Do not use  any sheets or blankets that are too big for your bed. They should not hang down onto the floor. Have a firm chair that has side arms. You can use this for support while you get dressed. Do not have throw rugs and other things on the floor that can make you trip. What can I do in the kitchen? Clean up any spills right away. Avoid walking on wet floors. Keep items that you use a lot in easy-to-reach places. If you need to reach something above you, use a strong step stool that has a grab bar. Keep electrical cords out of the way. Do not use floor polish or wax that makes floors slippery. If you must use wax, use non-skid floor wax. Do not have throw rugs and other things on the floor that can make you trip. What can I do with my stairs? Do not leave any items on the stairs. Make sure that there are handrails on both sides of the stairs and use them. Fix handrails that are broken or loose. Make sure that handrails are as long as the stairways. Check any carpeting to make sure that it is firmly attached to the stairs. Fix any carpet that is loose or worn. Avoid having throw rugs at the top or bottom of the stairs. If you do have throw rugs, attach them to the  floor with carpet tape. Make sure that you have a light switch at the top of the stairs and the bottom of the stairs. If you do not have them, ask someone to add them for you. What else can I do to help prevent falls? Wear shoes that: Do not have high heels. Have rubber bottoms. Are comfortable and fit you well. Are closed at the toe. Do not wear sandals. If you use a stepladder: Make sure that it is fully opened. Do not climb a closed stepladder. Make sure that both sides of the stepladder are locked into place. Ask someone to hold it for you, if possible. Clearly mark and make sure that you can see: Any grab bars or handrails. First and last steps. Where the edge of each step is. Use tools that help you move around (mobility aids)  if they are needed. These include: Canes. Walkers. Scooters. Crutches. Turn on the lights when you go into a dark area. Replace any light bulbs as soon as they burn out. Set up your furniture so you have a clear path. Avoid moving your furniture around. If any of your floors are uneven, fix them. If there are any pets around you, be aware of where they are. Review your medicines with your doctor. Some medicines can make you feel dizzy. This can increase your chance of falling. Ask your doctor what other things that you can do to help prevent falls. This information is not intended to replace advice given to you by your health care provider. Make sure you discuss any questions you have with your health care provider. Document Released: 01/02/2009 Document Revised: 08/14/2015 Document Reviewed: 04/12/2014 Elsevier Interactive Patient Education  2017 [employer].

## 2021-02-05 NOTE — Progress Notes (Signed)
Subjective:   Tracy Williams is a 75 y.o. female who presents for Medicare Annual (Subsequent) preventive examination.   I connected with Joycelyn Man today by telephone and verified that I am speaking with the correct person using two identifiers. Location patient: home Location provider: work Persons participating in the virtual visit: patient, provider.   I discussed the limitations, risks, security and privacy concerns of performing an evaluation and management service by telephone and the availability of in person appointments. I also discussed with the patient that there may be a patient responsible charge related to this service. The patient expressed understanding and verbally consented to this telephonic visit.    Interactive audio and video telecommunications were attempted between this provider and patient, however failed, due to patient having technical difficulties OR patient did not have access to video capability.  We continued and completed visit with audio only.    Review of Systems     Cardiac Risk Factors include: advanced age (>2men, >79 women);diabetes mellitus;dyslipidemia     Objective:    Today's Vitals   There is no height or weight on file to calculate BMI.  Advanced Directives 02/05/2021 08/04/2020 01/07/2020 01/03/2019 09/29/2018 01/16/2017 11/11/2016  Does Patient Have a Medical Advance Directive? Yes No Yes Yes Yes Yes Yes  Type of Estate agent of [place];Living will - Healthcare Power of [place];Living will Healthcare Power of [place];Living will Living will Healthcare Power of [place];Living will -  Does patient want to make changes to medical advance directive? - - - Yes (MAU/Ambulatory/Procedural Areas - Information given) No - Patient declined - -  Copy of Healthcare Power of Attorney in Chart? No - copy requested - No - copy requested No - copy requested - - -    Current Medications (verified) Outpatient Encounter  Medications as of 02/05/2021  Medication Sig   ACCU-CHEK SOFTCLIX LANCETS lancets Use as instructed to test sugars 2-3 times daily. E11.9   Alcohol Swabs (B-D SINGLE USE SWABS REGULAR) PADS USE ONE SWAB EACH TIME SUGARS ARE TESTED AS DIRECTED   atorvastatin (LIPITOR) 40 MG tablet TAKE 1 TABLET BY MOUTH EVERY DAY   Blood Glucose Monitoring Suppl (ACCU-CHEK GUIDE) w/Device KIT 1 each by Does not apply route 3 (three) times daily.   Cholecalciferol (VITAMIN D3) 2000 units TABS Take 2,000 Units by mouth daily.    dextromethorphan-guaiFENesin (MUCINEX DM) 30-600 MG 12hr tablet Take 1 tablet by mouth 2 (two) times daily.   doxycycline (VIBRA-TABS) 100 MG tablet Take 1 tablet (100 mg total) by mouth 2 (two) times daily.   EPINEPHrine 0.3 mg/0.3 mL IJ SOAJ injection Inject 0.3 mLs (0.3 mg total) into the muscle as needed for anaphylaxis.   glipiZIDE (GLUCOTROL XL) 5 MG 24 hr tablet TAKE 1 TABLET BY MOUTH EVERY DAY WITH BREAKFAST   glucose blood (ACCU-CHEK GUIDE) test strip Use as instructed   sitaGLIPtin-metformin (JANUMET) 50-500 MG tablet Take 1 tablet by mouth daily.   zinc gluconate 50 MG tablet Take 50 mg by mouth daily.   Ascorbic Acid (VITAMIN C PO) Take by mouth. (Patient not taking: Reported on 02/05/2021)   azelastine (ASTELIN) 0.1 % nasal spray Place 1 spray into both nostrils 2 (two) times daily. Use in each nostril as directed (Patient not taking: Reported on 02/05/2021)   No facility-administered encounter medications on file as of 02/05/2021.    Allergies (verified) Lisinopril, Metformin and related, Darvocet [propoxyphene n-acetaminophen], Demerol [meperidine], Hydrochlorothiazide, Percocet [oxycodone-acetaminophen], and Vicodin [hydrocodone-acetaminophen]   History: Past  Medical History:  Diagnosis Date   Allergy    Anemia    hx   Angio-edema    Arthritis    Diabetes mellitus without complication (HCC)    Type II   Dysrhythmia    Hx: of palpitations in 2010 only   GERD  (gastroesophageal reflux disease)    Hypertension    Pancreatic mass    /notes 02/25/2016   PONV (postoperative nausea and vomiting)    pt reports no PONV after biopsy on 12/24/15   Right knee DJD    Stress incontinence    Urinary frequency    Urinary urgency    Wears glasses    Past Surgical History:  Procedure Laterality Date   CESAREAN SECTION  1975   COLONOSCOPY W/ BIOPSIES AND POLYPECTOMY     Hx: of in 2012   DILATION AND CURETTAGE OF UTERUS     Hx: of 1978   EUS N/A 12/24/2015   Procedure: UPPER ENDOSCOPIC ULTRASOUND (EUS) RADIAL;  Surgeon: Willis Modena, MD;  Location: [facility];  Service: Endoscopy;  Laterality: N/A;   FINE NEEDLE ASPIRATION N/A 12/24/2015   Procedure: FINE NEEDLE ASPIRATION (FNA) RADIAL;  Surgeon: Willis Modena, MD;  Location: [facility];  Service: Endoscopy;  Laterality: N/A;   JOINT REPLACEMENT     Hx: of left Knee 2010   LAPAROSCOPIC DISTAL PANCREATECTOMY  02/25/2016   Diagnostic laparoscopy, laparoscopic hand-assisted    LAPAROSCOPIC SPLENECTOMY N/A 02/25/2016   Procedure: LAPAROSCOPIC SPLENECTOMY;  Surgeon: Almond Lint, MD;  Location: [facility];  Service: General;  Laterality: N/A;   PANCREATECTOMY N/A 02/25/2016   Procedure: LAPAROSCOPIC DISTAL PANCREATECTOMY;  Surgeon: Almond Lint, MD;  Location: [facility];  Service: General;  Laterality: N/A;   SPLENECTOMY  02/25/2016   TOTAL KNEE ARTHROPLASTY  2010   TOTAL KNEE ARTHROPLASTY Right 06/26/2012   Dr Thurston Hole   TOTAL KNEE ARTHROPLASTY Right 06/26/2012   Procedure: TOTAL KNEE ARTHROPLASTY- right;  Surgeon: Nilda Simmer, MD;  Location: [facility] OR;  Service: Orthopedics;  Laterality: Right;   Family History  Problem Relation Age of Onset   Diabetes Mellitus II Father    Hypertension Father    Cancer - Other Mother        Breast   Breast cancer Mother 65   Social History   Socioeconomic History   Marital status: Widowed    Spouse name: Not on file   Number of children: Not on file   Years of  education: Not on file   Highest education level: Not on file  Occupational History   Occupation: retired  Tobacco Use   Smoking status: Never   Smokeless tobacco: Never  Vaping Use   Vaping Use: Never used  Substance and Sexual Activity   Alcohol use: No   Drug use: No   Sexual activity: Not on file  Other Topics Concern   Not on file  Social History Narrative   Not on file   Social Determinants of Health   Financial Resource Strain: Low Risk    Difficulty of Paying Living Expenses: Not hard at all  Food Insecurity: Not on file  Transportation Needs: No Transportation Needs   Lack of Transportation (Medical): No   Lack of Transportation (Non-Medical): No  Physical Activity: Sufficiently Active   Days of Exercise per Week: 6 days   Minutes of Exercise per Session: 60 min  Stress: No Stress Concern Present   Feeling of Stress : Not at all  Social Connections: Moderately Integrated  Frequency of Communication with Friends and Family: Twice a week   Frequency of Social Gatherings with Friends and Family: Twice a week   Attends Religious Services: More than 4 times per year   Active Member of [employer] or Organizations: Yes   Attends Archivist Meetings: More than 4 times per year   Marital Status: Widowed    Tobacco Counseling Counseling given: Not Answered   Clinical Intake:  Pre-visit preparation completed: Yes  Pain : No/denies pain     Nutritional Risks: None Diabetes: Yes CBG done?: No Did pt. bring in CBG monitor from home?: No  How often do you need to have someone help you when you read instructions, pamphlets, or other written materials from your doctor or pharmacy?: 1 - Never What is the last grade level you completed in school?: BA  Diabetic?yes  Nutrition Risk Assessment:  Has the patient had any N/V/D within the last 2 months?  No  Does the patient have any non-healing wounds?  No  Has the patient had any unintentional weight loss or  weight gain?  No   Diabetes:  Is the patient diabetic?  Yes  If diabetic, was a CBG obtained today?  No  Did the patient bring in their glucometer from home?  No  How often do you monitor your CBG's? 2 x month .   Financial Strains and Diabetes Management:  Are you having any financial strains with the device, your supplies or your medication? No .  Does the patient want to be seen by Chronic Care Management for management of their diabetes?  No  Would the patient like to be referred to a Nutritionist or for Diabetic Management?  No   Diabetic Exams:  Diabetic Eye Exam: Completed 09/2020 Diabetic Foot Exam: Overdue, Pt has been advised about the importance in completing this exam. Pt is scheduled for diabetic foot exam on next office visit . Interpreter Needed?: No  Information entered by :: [place] of Daily Living In your present state of health, do you have any difficulty performing the following activities: 02/05/2021 09/03/2020  Hearing? N N  Vision? N N  Difficulty concentrating or making decisions? N N  Walking or climbing stairs? N N  Dressing or bathing? N N  Doing errands, shopping? N N  Preparing Food and eating ? N -  Using the Toilet? N -  In the past six months, have you accidently leaked urine? N -  Do you have problems with loss of bowel control? N -  Managing your Medications? N -  Managing your Finances? N -  Housekeeping or managing your Housekeeping? N -  Some recent data might be hidden    Patient Care Team: Midge Minium, MD as PCP - General (Family Medicine) Clarene Essex, MD as Consulting Physician (Gastroenterology) Elsie Saas, MD as Consulting Physician (Orthopedic Surgery) Belva Crome, MD as Consulting Physician (Cardiology) Sharyne Peach, MD as Consulting Physician (Ophthalmology) Madelin Rear, [facility] as Pharmacist (Pharmacist)  Indicate any recent Medical Services you may have received from other than [facility] providers  in the past year (date may be approximate).     Assessment:   This is a routine wellness examination for Baelyn.  Hearing/Vision screen Vision Screening - Comments:: Annual eye exams wears glasses   Dietary issues and exercise activities discussed: Current Exercise Habits: Home exercise routine, Type of exercise: walking, Time (Minutes): 60, Frequency (Times/Week): 5, Weekly Exercise (Minutes/Week): 300, Intensity: Mild, Exercise limited by: None  identified   Goals Addressed             This Visit's Progress    Patient Stated   On track    Drink more water & increase activity by walking more       Depression Screen PHQ 2/9 Scores 02/05/2021 02/05/2021 02/03/2021 09/03/2020 08/04/2020 04/28/2020 01/07/2020  PHQ - 2 Score 0 0 0 0 0 0 0  PHQ- 9 Score - - - 0 - 0 -  Exception Documentation - - - - - - -    Fall Risk Fall Risk  02/05/2021 02/03/2021 09/03/2020 04/28/2020 01/07/2020  Falls in the past year? 0 0 0 0 0  Comment - - - - -  Number falls in past yr: 0 0 0 0 0  Comment - - - - -  Injury with Fall? 0 0 0 0 0  Risk for fall due to : - No Fall Risks No Fall Risks No Fall Risks -  Risk for fall due to: Comment - - - - -  Follow up Falls evaluation completed Falls evaluation completed - - Falls prevention discussed    FALL RISK PREVENTION PERTAINING TO THE HOME:  Any stairs in or around the home? Yes  If so, are there any without handrails? No  Home free of loose throw rugs in walkways, pet beds, electrical cords, etc? Yes  Adequate lighting in your home to reduce risk of falls? Yes   ASSISTIVE DEVICES UTILIZED TO PREVENT FALLS:  Life alert? No  Use of a cane, walker or w/c? No  Grab bars in the bathroom? Yes  Shower chair or bench in shower? Yes  Elevated toilet seat or a handicapped toilet? No     Cognitive Function:  Normal cognitive status assessed by direct observation by this Nurse Health Advisor. No abnormalities found.         Immunizations Immunization History  Administered Date(s) Administered   Fluad Quad(high Dose 65+) 01/03/2019, 12/31/2019   HiB (PRP-T) 01/29/2016   Influenza, High Dose Seasonal PF 12/27/2016   Meningococcal Mcv4o 01/29/2016   Pneumococcal Conjugate-13 05/08/2014   Pneumococcal Polysaccharide-23 06/27/2012    TDAP status: Due, Education has been provided regarding the importance of this vaccine. Advised may receive this vaccine at local pharmacy or Health Dept. Aware to provide a copy of the vaccination record if obtained from local pharmacy or Health Dept. Verbalized acceptance and understanding.  Flu Vaccine status: Due, Education has been provided regarding the importance of this vaccine. Advised may receive this vaccine at local pharmacy or Health Dept. Aware to provide a copy of the vaccination record if obtained from local pharmacy or Health Dept. Verbalized acceptance and understanding.  Pneumococcal vaccine status: Up to date  Covid-19 vaccine status: Completed vaccines  Qualifies for Shingles Vaccine? Yes   Zostavax completed No   Shingrix Completed?: No.    Education has been provided regarding the importance of this vaccine. Patient has been advised to call insurance company to determine out of pocket expense if they have not yet received this vaccine. Advised may also receive vaccine at local pharmacy or Health Dept. Verbalized acceptance and understanding.  Screening Tests Health Maintenance  Topic Date Due   COVID-19 Vaccine (1) Never done   TETANUS/TDAP  Never done   Zoster Vaccines- Shingrix (1 of 2) Never done   INFLUENZA VACCINE  10/20/2020   FOOT EXAM  12/30/2020   MAMMOGRAM  01/20/2021   Hepatitis C Screening  09/03/2021 (Originally 12/05/1963)  HEMOGLOBIN A1C  03/22/2021   OPHTHALMOLOGY EXAM  03/31/2021   COLONOSCOPY (Pts 45-43yrs Insurance coverage will need to be confirmed)  06/27/2021   Pneumonia Vaccine 40+ Years old  Completed   DEXA SCAN  Completed    HPV VACCINES  Aged Out    Health Maintenance  Health Maintenance Due  Topic Date Due   COVID-19 Vaccine (1) Never done   TETANUS/TDAP  Never done   Zoster Vaccines- Shingrix (1 of 2) Never done   INFLUENZA VACCINE  10/20/2020   FOOT EXAM  12/30/2020   MAMMOGRAM  01/20/2021    Colorectal cancer screening: No longer required.   Mammogram status: No longer required due to age.  Bone Density status: Completed 09/19/2020. Results reflect: Bone density results: OSTEOPENIA. Repeat every 5 years.  Lung Cancer Screening: (Low Dose CT Chest recommended if Age 27-80 years, 30 pack-year currently smoking OR have quit w/in 15years.) does not qualify.   Lung Cancer Screening Referral: n/a  Additional Screening:  Hepatitis C Screening: does qualify;   Vision Screening: Recommended annual ophthalmology exams for early detection of glaucoma and other disorders of the eye. Is the patient up to date with their annual eye exam?  Yes  Who is the provider or what is the name of the office in which the patient attends annual eye exams? Dr. Delman Cheadle  If pt is not established with a provider, would they like to be referred to a provider to establish care? No .   Dental Screening: Recommended annual dental exams for proper oral hygiene  Community Resource Referral / Chronic Care Management: CRR required this visit?  No   CCM required this visit?  No      Plan:     I have personally reviewed and noted the following in the patient's chart:   Medical and social history Use of alcohol, tobacco or illicit drugs  Current medications and supplements including opioid prescriptions.  Functional ability and status Nutritional status Physical activity Advanced directives List of other physicians Hospitalizations, surgeries, and ER visits in previous 12 months Vitals Screenings to include cognitive, depression, and falls Referrals and appointments  In addition, I have reviewed and discussed  with patient certain preventive protocols, quality metrics, and best practice recommendations. A written personalized care plan for preventive services as well as general preventive health recommendations were provided to patient.     Randel Pigg, LPN   [id number]/10/[id number]   Nurse Notes: none

## 2021-02-09 ENCOUNTER — Ambulatory Visit: Payer: Medicare HMO

## 2021-04-18 ENCOUNTER — Other Ambulatory Visit: Payer: Self-pay | Admitting: Family Medicine

## 2021-07-01 ENCOUNTER — Telehealth: Payer: Self-pay | Admitting: Pharmacist

## 2021-07-01 ENCOUNTER — Other Ambulatory Visit: Payer: Self-pay | Admitting: Family Medicine

## 2021-07-01 DIAGNOSIS — Z1231 Encounter for screening mammogram for malignant neoplasm of breast: Secondary | ICD-10-CM

## 2021-07-01 NOTE — Progress Notes (Signed)
? ? ?Chronic Care Management ?Pharmacy Assistant  ? ?Name: Tracy Williams  MRN: [id number] DOB: Apr 16, 1945 ? ? ?Reason for Encounter: Disease State - General Adherence Call  ?  ? ?Recent office visits:  ?02/03/21 Maximiano Coss, NP - Family Medicine (Video visit) - Acute sinusitis - azelastine (ASTELIN) 0.1 % nasal spray, dextromethorphan-guaiFENesin (MUCINEX DM) 30-600 MG 12hr tablet, and doxycycline (VIBRA-TABS) 100 MG tablet were prescribed. Follow up if no improvement.  ? ?Recent consult visits:  ?None noted.  ? ?Hospital visits:  ?None in previous 6 months ? ? ?Medications: ?Outpatient Encounter Medications as of 07/01/2021  ?Medication Sig  ? ACCU-CHEK SOFTCLIX LANCETS lancets Use as instructed to test sugars 2-3 times daily. E11.9  ? Alcohol Swabs (B-D SINGLE USE SWABS REGULAR) PADS USE ONE SWAB EACH TIME SUGARS ARE TESTED AS DIRECTED  ? Ascorbic Acid (VITAMIN C PO) Take by mouth. (Patient not taking: Reported on 02/05/2021)  ? atorvastatin (LIPITOR) 40 MG tablet TAKE 1 TABLET BY MOUTH EVERY DAY  ? azelastine (ASTELIN) 0.1 % nasal spray Place 1 spray into both nostrils 2 (two) times daily. Use in each nostril as directed (Patient not taking: Reported on 02/05/2021)  ? Blood Glucose Monitoring Suppl (ACCU-CHEK GUIDE) w/Device KIT 1 each by Does not apply route 3 (three) times daily.  ? Cholecalciferol (VITAMIN D3) 2000 units TABS Take 2,000 Units by mouth daily.   ? dextromethorphan-guaiFENesin (MUCINEX DM) 30-600 MG 12hr tablet Take 1 tablet by mouth 2 (two) times daily.  ? doxycycline (VIBRA-TABS) 100 MG tablet Take 1 tablet (100 mg total) by mouth 2 (two) times daily.  ? EPINEPHrine 0.3 mg/0.3 mL IJ SOAJ injection Inject 0.3 mLs (0.3 mg total) into the muscle as needed for anaphylaxis.  ? glipiZIDE (GLUCOTROL XL) 5 MG 24 hr tablet TAKE 1 TABLET BY MOUTH EVERY DAY WITH BREAKFAST  ? glucose blood (ACCU-CHEK GUIDE) test strip Use as instructed  ? sitaGLIPtin-metformin (JANUMET) 50-500 MG tablet Take 1  tablet by mouth daily.  ? zinc gluconate 50 MG tablet Take 50 mg by mouth daily.  ? ?No facility-administered encounter medications on file as of 07/01/2021.  ? ? ?Have you had any problems recently with your health? ?Patient denied any problems with her health currently. She reported she is doing well.  ? ?Have you had any problems with your pharmacy? ?Patient denied any problems with her pharmacy at this time.  ? ?What issues or side effects are you having with your medications? ?Patient denied any issues or side effects with her current medications.  ? ?What would you like me to pass along to Leata Mouse, CPP for them to help you with?  ?Patient did not have anything to pass along to CPP at this time.  ? ?What can we do to take care of you better? ?Patient did not have any recommendations and thanked me for the follow up call.  ? ?Care Gaps ? ?AWV: done 01/07/20 ?Colonoscopy: done 06/28/11 ?DM Eye Exam: due 03/31/21 ?DM Foot Exam: due 12/30/20 ?Microalbumin: done 09/19/20 ?HbgAIC: done 09/19/20 (7.0) ?DEXA: done 09/19/20 ?Mammogram: scheduled 07/07/21 ? ? ?Star Rating Drugs: ?sitaGLIPtin-metformin (JANUMET) 50-500 MG - last filled 06/20/21 30 days  ?glipiZIDE (GLUCOTROL XL) 5 MG 24 hr tablet - last filled 04/20/21 90 days  ?atorvastatin (LIPITOR) 40 MG tablet - last filled 04/24/21 90 days  ? ? ?Future Appointments  ?Date Time Provider [place]  ?07/07/2021  3:10 PM [facility] 3 [facility]  ?12/09/2021  1:00 PM [facility]  PHARMACIST [facility]  ? ? ? ?Liza Showfety, CCMA ?Clinical Pharmacist Assistant  ?[phone] ? ? ?

## 2021-07-07 ENCOUNTER — Ambulatory Visit
Admission: RE | Admit: 2021-07-07 | Discharge: 2021-07-07 | Disposition: A | Discharge status: Home / Self Care | Payer: Medicare HMO | Source: Ambulatory Visit | Attending: Family Medicine | Admitting: Family Medicine

## 2021-07-07 DIAGNOSIS — Z1231 Encounter for screening mammogram for malignant neoplasm of breast: Secondary | ICD-10-CM

## 2021-07-16 ENCOUNTER — Other Ambulatory Visit: Payer: Self-pay | Admitting: Family Medicine

## 2021-07-17 ENCOUNTER — Other Ambulatory Visit: Payer: Self-pay | Admitting: Family Medicine

## 2021-09-01 ENCOUNTER — Encounter: Payer: Self-pay | Admitting: Family Medicine

## 2021-09-01 ENCOUNTER — Ambulatory Visit (INDEPENDENT_AMBULATORY_CARE_PROVIDER_SITE_OTHER): Payer: Medicare HMO | Admitting: Family Medicine

## 2021-09-01 VITALS — BP 126/68 | HR 70 | Temp 97.8°F | Resp 16 | Ht 65.0 in | Wt 157.2 lb

## 2021-09-01 DIAGNOSIS — R7989 Other specified abnormal findings of blood chemistry: Secondary | ICD-10-CM | POA: Diagnosis not present

## 2021-09-01 DIAGNOSIS — S50862A Insect bite (nonvenomous) of left forearm, initial encounter: Secondary | ICD-10-CM | POA: Diagnosis not present

## 2021-09-01 DIAGNOSIS — E663 Overweight: Secondary | ICD-10-CM | POA: Insufficient documentation

## 2021-09-01 DIAGNOSIS — Z794 Long term (current) use of insulin: Secondary | ICD-10-CM | POA: Diagnosis not present

## 2021-09-01 DIAGNOSIS — W57XXXA Bitten or stung by nonvenomous insect and other nonvenomous arthropods, initial encounter: Secondary | ICD-10-CM | POA: Diagnosis not present

## 2021-09-01 DIAGNOSIS — E118 Type 2 diabetes mellitus with unspecified complications: Secondary | ICD-10-CM | POA: Diagnosis not present

## 2021-09-01 DIAGNOSIS — E785 Hyperlipidemia, unspecified: Secondary | ICD-10-CM

## 2021-09-01 LAB — BASIC METABOLIC PANEL
BUN: 20 mg/dL (ref 6–23)
CO2: 30 mEq/L (ref 19–32)
Calcium: 9.6 mg/dL (ref 8.4–10.5)
Chloride: 101 mEq/L (ref 96–112)
Creatinine, Ser: 1.35 mg/dL — ABNORMAL HIGH (ref 0.40–1.20)
GFR: 38.38 mL/min — ABNORMAL LOW (ref >60.00)
Glucose, Bld: 105 mg/dL — ABNORMAL HIGH (ref 70–99)
Potassium: 5.2 mEq/L — ABNORMAL HIGH (ref 3.5–5.1)
Sodium: 137 mEq/L (ref 135–145)

## 2021-09-01 LAB — LIPID PANEL
Cholesterol: 188 mg/dL (ref 0–200)
HDL: 44.4 mg/dL (ref >39.00)
NonHDL: 143.15
Total CHOL/HDL Ratio: 4
Triglycerides: 204 mg/dL — ABNORMAL HIGH (ref 0.0–149.0)
VLDL: 40.8 mg/dL — ABNORMAL HIGH (ref 0.0–40.0)

## 2021-09-01 LAB — CBC WITH DIFFERENTIAL/PLATELET
Basophils Absolute: 0.1 10*3/uL (ref 0.0–0.1)
Basophils Relative: 0.7 % (ref 0.0–3.0)
Eosinophils Absolute: 0.4 10*3/uL (ref 0.0–0.7)
Eosinophils Relative: 3.6 % (ref 0.0–5.0)
HCT: 37.5 % (ref 36.0–46.0)
Hemoglobin: 12.4 g/dL (ref 12.0–15.0)
Lymphocytes Relative: 18.9 % (ref 12.0–46.0)
Lymphs Abs: 2 10*3/uL (ref 0.7–4.0)
MCHC: 33 g/dL (ref 30.0–36.0)
MCV: 90.2 fl (ref 78.0–100.0)
Monocytes Absolute: 1 10*3/uL (ref 0.1–1.0)
Monocytes Relative: 9.3 % (ref 3.0–12.0)
Neutro Abs: 7.2 10*3/uL (ref 1.4–7.7)
Neutrophils Relative %: 67.5 % (ref 43.0–77.0)
Platelets: 342 10*3/uL (ref 150.0–400.0)
RBC: 4.16 Mil/uL (ref 3.87–5.11)
RDW: 14.2 % (ref 11.5–15.5)
WBC: 10.7 10*3/uL — ABNORMAL HIGH (ref 4.0–10.5)

## 2021-09-01 LAB — HEPATIC FUNCTION PANEL
ALT: 14 U/L (ref 0–35)
AST: 17 U/L (ref 0–37)
Albumin: 4.1 g/dL (ref 3.5–5.2)
Alkaline Phosphatase: 91 U/L (ref 39–117)
Bilirubin, Direct: 0.2 mg/dL (ref 0.0–0.3)
Total Bilirubin: 0.8 mg/dL (ref 0.2–1.2)
Total Protein: 7 g/dL (ref 6.0–8.3)

## 2021-09-01 LAB — MICROALBUMIN / CREATININE URINE RATIO
Creatinine,U: 119.2 mg/dL
Microalb Creat Ratio: 0.6 mg/g (ref 0.0–30.0)
Microalb, Ur: <0.7 mg/dL (ref 0.0–1.9)

## 2021-09-01 LAB — TSH: TSH: 1.32 u[IU]/mL (ref 0.35–5.50)

## 2021-09-01 LAB — LDL CHOLESTEROL, DIRECT: Direct LDL: 110 mg/dL

## 2021-09-01 LAB — HEMOGLOBIN A1C: Hgb A1c MFr Bld: 6.7 % — ABNORMAL HIGH (ref 4.6–6.5)

## 2021-09-01 MED ORDER — ATORVASTATIN CALCIUM 40 MG PO TABS: 40.0000 mg | ORAL_TABLET | Freq: Every day | ORAL | 1 refills | Status: DC

## 2021-09-01 MED ORDER — TRIAMCINOLONE ACETONIDE 0.1 % EX OINT: 1.0000 "application " | TOPICAL_OINTMENT | Freq: Two times a day (BID) | CUTANEOUS | 1 refills | Status: DC

## 2021-09-01 NOTE — Patient Instructions (Signed)
Schedule your complete physical in 6 months We'll notify you of your lab results and make any changes if needed Keep up the good work on healthy diet and regular exercise- you look great!!! Call and schedule your eye exam USE the triamcinolone ointment twice daily as needed on the itchy areas Call with any questions or concerns Have a WONDERFUL summer!!!

## 2021-09-01 NOTE — Progress Notes (Incomplete)
   Subjective:    Patient ID: Tracy Williams, female    DOB: [birth year], 76 y.o.   MRN: [id number]  HPI DM- chronic problem, on Glipizide '5mg'$  daily, Janumet 50/500 mg BID.  'I feel great'.  No CP, SOB, HAs, visual changes, abd pain, N/V.  No symptomatic lows.  No numbness/tingling of hands/feet.  Due for eye exam.  Due for foot exam.  UTD on microalbumin but coming due  Hyperlipidemia- chronic problem, on Lipitor '40mg'$  daily  Overweight- pt is down 6 lbs since last visit.  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  Rash- pt reports she will develop a lesion that 'itches like crazy', then hurts, and then resolves.  First noticed ~2 weeks ago.  Has had 1 on leg, currently on L forearm.   Review of Systems For ROS see HPI     Objective:   Physical Exam        Assessment & Plan:

## 2021-09-02 ENCOUNTER — Other Ambulatory Visit: Payer: Self-pay

## 2021-09-02 DIAGNOSIS — R7989 Other specified abnormal findings of blood chemistry: Secondary | ICD-10-CM

## 2021-09-02 NOTE — Progress Notes (Signed)
Informed the pt of the lab results and she is going to [employer] at [place] is in .

## 2021-09-30 ENCOUNTER — Encounter: Payer: Self-pay | Admitting: Family Medicine

## 2021-10-15 ENCOUNTER — Other Ambulatory Visit: Payer: Self-pay | Admitting: Family Medicine

## 2021-10-15 ENCOUNTER — Other Ambulatory Visit (INDEPENDENT_AMBULATORY_CARE_PROVIDER_SITE_OTHER): Payer: Medicare HMO

## 2021-10-15 DIAGNOSIS — R7989 Other specified abnormal findings of blood chemistry: Secondary | ICD-10-CM

## 2021-10-15 LAB — BASIC METABOLIC PANEL
BUN: 31 mg/dL — ABNORMAL HIGH (ref 6–23)
CO2: 30 mEq/L (ref 19–32)
Calcium: 9.1 mg/dL (ref 8.4–10.5)
Chloride: 102 mEq/L (ref 96–112)
Creatinine, Ser: 1.56 mg/dL — ABNORMAL HIGH (ref 0.40–1.20)
GFR: 32.24 mL/min — ABNORMAL LOW (ref >60.00)
Glucose, Bld: 124 mg/dL — ABNORMAL HIGH (ref 70–99)
Potassium: 4.3 mEq/L (ref 3.5–5.1)
Sodium: 139 mEq/L (ref 135–145)

## 2021-10-16 ENCOUNTER — Telehealth: Payer: Self-pay

## 2021-10-16 NOTE — Telephone Encounter (Signed)
-----   Message from Midge Minium, MD sent at 10/16/2021  7:43 AM EDT ----- Your Creatinine is actually higher than last check, which means kidney function is worse.  Please hold your Janumet and we are going to refer you to a kidney specialist for a complete evaluation.  Make sure you are continuing to drink plenty of water!

## 2021-10-16 NOTE — Telephone Encounter (Signed)
Informed pt of lab results advised to hold the Janumet and we referred her to a kidney specialist

## 2021-10-23 ENCOUNTER — Encounter: Payer: Self-pay | Admitting: Family Medicine

## 2021-10-23 NOTE — Telephone Encounter (Signed)
Pt asking for information I see referral to [facility] but no note as to what office it went to could you tell me where it was sent so I can get the patient the contact info and they can be scheduled?

## 2021-10-26 NOTE — Telephone Encounter (Signed)
Wasn't routed to the [place], I have faxed the referral and the pt is aware of this

## 2021-11-16 ENCOUNTER — Encounter: Payer: Self-pay | Admitting: Family Medicine

## 2021-12-01 NOTE — Progress Notes (Deleted)
Chronic Care Management Pharmacy Note  12/01/2021 Name:  Tracy Williams MRN:  [id number] DOB:  05/16/1945  Summary: -No Rx changes, patient would be interested in flu shot when here 12/2020 with health coach  -Consider CKD staging - GFR 40-45 last 3+ labs  Subjective: Tracy Williams is an 76 y.o. year old female who is a primary patient of Tabori, Aundra Millet, MD.  The CCM team was consulted for assistance with disease management and care coordination needs.    Engaged with patient by telephone for initial visit in response to provider referral for pharmacy case management and/or care coordination services.   Consent to Services:  The patient was given the following information about Chronic Care Management services today, agreed to services, and gave verbal consent: 1. CCM service includes personalized support from designated clinical staff supervised by the primary care provider, including individualized plan of care and coordination with other care providers 2. 24/7 contact phone numbers for assistance for urgent and routine care needs. 3. Service will only be billed when office clinical staff spend 20 minutes or more in a month to coordinate care. 4. Only one practitioner may furnish and bill the service in a calendar month. 5.The patient may stop CCM services at any time (effective at the end of the month) by phone call to the office staff. 6. The patient will be responsible for cost sharing (co-pay) of up to 20% of the service fee (after annual deductible is met). Patient agreed to services and consent obtained.  Patient Care Team: Midge Minium, MD as PCP - General (Family Medicine) Clarene Essex, MD as Consulting Physician (Gastroenterology) Elsie Saas, MD as Consulting Physician (Orthopedic Surgery) Belva Crome, MD as Consulting Physician (Cardiology) Sharyne Peach, MD as Consulting Physician (Ophthalmology) Madelin Rear, [facility] (Inactive) as Pharmacist  (Pharmacist)   Recent office visits:  09/03/20 [name], MD (PCP) - Family Medicine (Video Visit) - Diabetes Type 2 - Labs were ordered. No medication changes. Follow up not indicated.    Recent consult visits:  None noted.    Hospital visits:  None in previous 6 months  Objective:  Lab Results  Component Value Date   CREATININE 1.56 (H) 10/15/2021   CREATININE 1.35 (H) 09/01/2021   CREATININE 1.26 (H) 09/19/2020    Lab Results  Component Value Date   HGBA1C 6.7 (H) 09/01/2021   Last diabetic Eye exam:  Lab Results  Component Value Date/Time   HMDIABEYEEXA Retinopathy (A) 03/31/2020 12:00 AM    Last diabetic Foot exam: No results found for: "HMDIABFOOTEX"      Component Value Date/Time   CHOL 188 09/01/2021 1058   TRIG 204.0 (H) 09/01/2021 1058   HDL 44.40 09/01/2021 1058   CHOLHDL 4 09/01/2021 1058   VLDL 40.8 (H) 09/01/2021 1058   LDLCALC 99 04/28/2020 1300   LDLDIRECT 110.0 09/01/2021 1058       Latest Ref Rng & Units 09/01/2021   10:58 AM 04/28/2020    1:00 PM 12/31/2019   10:34 AM  Hepatic Function  Total Protein 6.0 - 8.3 g/dL 7.0  7.0  6.7   Albumin 3.5 - 5.2 g/dL 4.1  4.1  4.1   AST 0 - 37 U/L _0 ALT 0 - 35 U/L _1 Alk Phosphatase 39 - 117 U/L 91  97  98   Total Bilirubin 0.2 - 1.2 mg/dL 0.8  0.7  0.8   Bilirubin,  Direct 0.0 - 0.3 mg/dL 0.2  0.1  0.1     Lab Results  Component Value Date/Time   TSH 1.32 09/01/2021 10:58 AM   TSH 1.25 04/28/2020 01:00 PM       Latest Ref Rng & Units 09/01/2021   10:58 AM 04/28/2020    1:00 PM 01/16/2020   12:34 PM  CBC  WBC 4.0 - 10.5 K/uL 10.7  11.8  12.0   Hemoglobin 12.0 - 15.0 g/dL 12.4  12.2  12.0   Hematocrit 36.0 - 46.0 % 37.5  36.2  36.7   Platelets 150.0 - 400.0 K/uL 342.0  345.0  364.0     Lab Results  Component Value Date/Time   VD25OH 83.88 04/28/2020 01:00 PM   VD25OH 69.98 12/31/2019 10:34 AM    Clinical ASCVD:  The 10-year ASCVD risk score (Arnett DK, et al.,  2019) is: 27.8%   Values used to calculate the score:     Age: 18 years     Sex: Female     Is Non-Hispanic African American: No     Diabetic: Yes     Tobacco smoker: No     Systolic Blood Pressure: 956 mmHg     Is BP treated: No     HDL Cholesterol: 44.4 mg/dL     Total Cholesterol: 188 mg/dL    Social History   Tobacco Use  Smoking Status Never  Smokeless Tobacco Never   BP Readings from Last 3 Encounters:  09/01/21 126/68  04/28/20 128/70  12/31/19 123/80   Pulse Readings from Last 3 Encounters:  09/01/21 70  04/28/20 70  12/31/19 86   Wt Readings from Last 3 Encounters:  09/01/21 157 lb 4 oz (71.3 kg)  04/28/20 163 lb (73.9 kg)  01/07/20 162 lb (73.5 kg)    Assessment: Review of patient past medical history, allergies, medications, health status, including review of consultants reports, laboratory and other test data, was performed as part of comprehensive evaluation and provision of chronic care management services.   SDOH:  (Social Determinants of Health) assessments and interventions performed: Yes SDOH Interventions    Flowsheet Row Clinical Support from 02/05/2021 in [facility] from 01/07/2020 in [place]  SDOH Interventions    Housing Interventions Intervention Not Indicated --  Transportation Interventions Intervention Not Indicated --  Financial Strain Interventions Intervention Not Indicated --  Physical Activity Interventions Intervention Not Indicated Intervention Not Indicated  [pt has set a goal to be more active]  Stress Interventions Intervention Not Indicated --  Social Connections Interventions Intervention Not Indicated --       CCM Care Plan  Allergies  Allergen Reactions   Lisinopril Swelling    Caused throat and tongue to swell (breathing was not affected, however)   Metformin And Related Nausea And Vomiting   Darvocet [Propoxyphene  N-Acetaminophen] Nausea And Vomiting   Demerol [Meperidine] Nausea And Vomiting   Hydrochlorothiazide Nausea And Vomiting   Percocet [Oxycodone-Acetaminophen] Nausea And Vomiting   Vicodin [Hydrocodone-Acetaminophen] Nausea And Vomiting    Medications Reviewed Today     Reviewed by Midge Minium, MD (Physician) on 09/01/21 at [facility] List Status: <None>   Medication Order Taking? Sig Documenting Provider Last Dose Status Informant  ACCU-CHEK SOFTCLIX LANCETS lancets [id number] Yes Use as instructed to test sugars 2-3 times daily. E11.9 Midge Minium, MD Taking Active Self  Alcohol Swabs (B-D SINGLE USE SWABS REGULAR) PADS [id number] Yes USE ONE SWAB  EACH TIME SUGARS ARE TESTED AS DIRECTED Midge Minium, MD Taking Active Self  Ascorbic Acid (VITAMIN C PO) [id number] No Take by mouth.  Patient not taking: Reported on 09/01/2021   [provider] Not Taking Active   atorvastatin (LIPITOR) 40 MG tablet [id number] No TAKE 1 TABLET BY MOUTH EVERY DAY  Patient not taking: Reported on 09/01/2021   Midge Minium, MD Not Taking Consider Medication Status and Discontinue   azelastine (ASTELIN) 0.1 % nasal spray [id number] Yes Place 1 spray into both nostrils 2 (two) times daily. Use in each nostril as directed Maximiano Coss, NP Taking Active   Blood Glucose Monitoring Suppl (ACCU-CHEK GUIDE) w/Device KIT [id number] Yes 1 each by Does not apply route 3 (three) times daily. Midge Minium, MD Taking Active Self  Cholecalciferol (VITAMIN D3) 2000 units TABS [id number] Yes Take 2,000 Units by mouth daily.  [provider] Taking Active Self  dextromethorphan-guaiFENesin (MUCINEX DM) 30-600 MG 12hr tablet [id number] No Take 1 tablet by mouth 2 (two) times daily.  Patient not taking: Reported on 09/01/2021   Maximiano Coss, NP Not Taking Active   doxycycline (VIBRA-TABS) 100 MG tablet [id number] No Take 1 tablet (100 mg total) by mouth 2 (two) times daily.  Patient  not taking: Reported on 09/01/2021   Maximiano Coss, NP Not Taking Active   EPINEPHrine 0.3 mg/0.3 mL IJ SOAJ injection [id number] Yes Inject 0.3 mLs (0.3 mg total) into the muscle as needed for anaphylaxis. Ward, Delice Bison, DO Taking Active   glipiZIDE (GLUCOTROL XL) 5 MG 24 hr tablet [id number] Yes TAKE 1 TABLET BY MOUTH EVERY DAY WITH BREAKFAST Midge Minium, MD Taking Active   glucose blood (ACCU-CHEK GUIDE) test strip [id number] Yes Use as instructed Midge Minium, MD Taking Active Self  JANUMET 50-500 MG tablet [id number] Yes TAKE 1 TABLET BY MOUTH TWICE A DAY WITH MEALS Midge Minium, MD Taking Active   zinc gluconate 50 MG tablet [id number] Yes Take 50 mg by mouth daily. 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   Type 2 diabetes mellitus with complication, with long-term current use of insulin ([place]) 06/09/2012   General medical examination 11/26/2010   Colon cancer screening 11/26/2010   Hyperlipidemia 11/26/2010    Immunization History  Administered Date(s) Administered   Fluad Quad(high Dose 65+) 01/03/2019, 12/31/2019   HIB (PRP-T) 01/29/2016   Influenza, High Dose Seasonal PF 12/27/2016   Meningococcal Mcv4o 01/29/2016   Pneumococcal Conjugate-13 05/08/2014   Pneumococcal Polysaccharide-23 06/27/2012   Conditions to be addressed/monitored: HLD, DMII, and Osteopenia   There are no care plans that you recently modified to display for this patient.   Future Appointments  Date Time Provider [place]  12/09/2021  1:00 PM [facility] CCM PHARMACIST [facility]  03/04/2022  2:00 PM Midge Minium, MD [facility] [place], PharmD Clinical Pharmacist  [facility] [phone]    Current Barriers:  Unable to independently afford treatment regimen  Pharmacist Clinical Goal(s):  Patient will verbalize  ability to afford treatment regimen achieve adherence to monitoring guidelines and medication adherence to achieve therapeutic efficacy through collaboration with PharmD and provider.   Interventions: 1:1 collaboration with Midge Minium, MD regarding development and update of comprehensive plan of care as evidenced by provider attestation and co-signature Inter-disciplinary care team collaboration (see longitudinal plan of care) Comprehensive medication review performed; medication list updated in electronic medical record  Hyperlipidemia: (LDL goal < 100) -Controlled -Current treatment: Atorvastatin 40 mg once  -Reviewed for side effects - no problems noted   -Educated on Cholesterol goals;  Importance of limiting foods high in cholesterol; -Counseled on diet and exercise extensively Recommended to continue current medication  Diabetes (A1c goal <7%) -Controlled -No ACEi/ARB due to angioedema with lisinopril  -Current medications: Janumet 50-536m once daily with meal  Glipizide xl 58mdaily  -Medications previously tried: Metformin (n/v)  -Current home glucose readings fasting glucose: no recent numbers post prandial glucose: n/a -Denies hypoglycemic/hyperglycemic symptoms -Current meal patterns: some processed foods recently. Sandwich or salad. Bagel w/ peanut butter.  -Current exercise: active with work.  -Educated on Complications of diabetes including kidney damage, retinal damage, and cardiovascular disease; Prevention and management of hypoglycemic episodes; -Counseled to check feet daily and get yearly eye exams -Counseled  on diet and exercise extensively Recommended to continue current medication  Care Gaps -plans to get flu shot at [facility]   Patient Goals/Self-Care Activities Patient will:  - take medications as prescribed  Follow Up Plan: 6 month DM call, 12 month CPP f/u telephone call   Medication Assistance: None required.  Patient affirms current coverage meets needs.  [facility]  [id number] [address] [id number]: [phone]: [phone] [address] [id number]: [phone]: [phone] Up:  Patient agrees to Care Plan and Follow-up.

## 2021-12-09 ENCOUNTER — Telehealth: Payer: Medicare HMO

## 2021-12-09 DIAGNOSIS — H5203 Hypermetropia, bilateral: Secondary | ICD-10-CM | POA: Diagnosis not present

## 2021-12-09 DIAGNOSIS — H524 Presbyopia: Secondary | ICD-10-CM | POA: Diagnosis not present

## 2021-12-09 DIAGNOSIS — E119 Type 2 diabetes mellitus without complications: Secondary | ICD-10-CM | POA: Diagnosis not present

## 2021-12-16 ENCOUNTER — Ambulatory Visit: Payer: Medicare HMO | Admitting: Pharmacist

## 2021-12-16 DIAGNOSIS — E785 Hyperlipidemia, unspecified: Secondary | ICD-10-CM

## 2021-12-16 DIAGNOSIS — Z794 Long term (current) use of insulin: Secondary | ICD-10-CM

## 2021-12-16 DIAGNOSIS — E118 Type 2 diabetes mellitus with unspecified complications: Secondary | ICD-10-CM

## 2021-12-16 NOTE — Progress Notes (Signed)
Chronic Care Management Pharmacy Note  12/16/2021 Name:  Tracy Williams MRN:  [id number] DOB:  [birth year]  Summary: PharmD FU visit.  Last GFR patient had was June and was 38.38.  She stopped Janumet and says her sugars have been creeping up and she was concerned.  140s fasting lately.  I have asked her to schedule FU with PCP for A1c and another CMP to check kidney function.  Recommendations: Recheck A1c, CMP - re introduce DM therapy as indicated. Could consider low dose metformin as long as GFR > 30.  Metformin XR 510m up to BID if tolerated  FU 3 months  Subjective: Tracy Shellhammeris an 76y.o. year old female who is a primary patient of Tabori, KAundra Millet MD.  The CCM team was consulted for assistance with disease management and care coordination needs.    Engaged with patient by telephone for follow up visit in response to provider referral for pharmacy case management and/or care coordination services.   Consent to Services:  The patient was given the following information about Chronic Care Management services today, agreed to services, and gave verbal consent: 1. CCM service includes personalized support from designated clinical staff supervised by the primary care provider, including individualized plan of care and coordination with other care providers 2. 24/7 contact phone numbers for assistance for urgent and routine care needs. 3. Service will only be billed when office clinical staff spend 20 minutes or more in a month to coordinate care. 4. Only one practitioner may furnish and bill the service in a calendar month. 5.The patient may stop CCM services at any time (effective at the end of the month) by phone call to the office staff. 6. The patient will be responsible for cost sharing (co-pay) of up to 20% of the service fee (after annual deductible is met). Patient agreed to services and consent obtained.  Patient Care Team: TMidge Minium MD as PCP -  General (Family Medicine) MClarene Essex MD as Consulting Physician (Gastroenterology) WElsie Saas MD as Consulting Physician (Orthopedic Surgery) SBelva Crome MD as Consulting Physician (Cardiology) GSharyne Peach MD as Consulting Physician (Ophthalmology) [facility]Edythe Clarity [facility](Ph[facility]ist)   Recent office visits:  09/03/20 KAnnye Asa MD (PCP) - Family Medicine (Video Visit) - Diabetes Type 2 - Labs were ordered. No medication changes. Follow up not indicated.    Recent consult visits:  None noted.    Hospital visits:  None in previous 6 months  Objective:  Lab Results  Component Value Date   CREATININE 1.56 (H) 10/15/2021   CREATININE 1.35 (H) 09/01/2021   CREATININE 1.26 (H) 09/19/2020    Lab Results  Component Value Date   HGBA1C 6.7 (H) 09/01/2021   Last diabetic Eye exam:  Lab Results  Component Value Date/Time   HMDIABEYEEXA Retinopathy (A) 03/31/2020 12:00 AM    Last diabetic Foot exam: No results found for: "HMDIABFOOTEX"      Component Value Date/Time   CHOL 188 09/01/2021 1058   TRIG 204.0 (H) 09/01/2021 1058   HDL 44.40 09/01/2021 1058   CHOLHDL 4 09/01/2021 1058   VLDL 40.8 (H) 09/01/2021 1058   LDLCALC 99 04/28/2020 1300   LDLDIRECT 110.0 09/01/2021 1058       Latest Ref Rng & Units 09/01/2021   10:58 AM 04/28/2020    1:00 PM 12/31/2019   10:34 AM  Hepatic Function  Total Protein 6.0 - 8.3 g/dL 7.0  7.0  6.7   Albumin 3.5 -  5.2 g/dL 4.1  4.1  4.1   AST 0 - 37 U/L _0 ALT 0 - 35 U/L _1 Alk Phosphatase 39 - 117 U/L 91  97  98   Total Bilirubin 0.2 - 1.2 mg/dL 0.8  0.7  0.8   Bilirubin, Direct 0.0 - 0.3 mg/dL 0.2  0.1  0.1     Lab Results  Component Value Date/Time   TSH 1.32 09/01/2021 10:58 AM   TSH 1.25 04/28/2020 01:00 PM       Latest Ref Rng & Units 09/01/2021   10:58 AM 04/28/2020    1:00 PM 01/16/2020   12:34 PM  CBC  WBC 4.0 - 10.5 K/uL 10.7  11.8  12.0   Hemoglobin 12.0 - 15.0 g/dL 12.4  12.2   12.0   Hematocrit 36.0 - 46.0 % 37.5  36.2  36.7   Platelets 150.0 - 400.0 K/uL 342.0  345.0  364.0     Lab Results  Component Value Date/Time   VD25OH 83.88 04/28/2020 01:00 PM   VD25OH 69.98 12/31/2019 10:34 AM    Clinical ASCVD:  The 10-year ASCVD risk score (Arnett DK, et al., 2019) is: 30.4%   Values used to calculate the score:     Age: 51 years     Sex: Female     Is Non-Hispanic African American: No     Diabetic: Yes     Tobacco smoker: No     Systolic Blood Pressure: 034 mmHg     Is BP treated: No     HDL Cholesterol: 44.4 mg/dL     Total Cholesterol: 188 mg/dL    Social History   Tobacco Use  Smoking Status Never  Smokeless Tobacco Never   BP Readings from Last 3 Encounters:  09/01/21 126/68  04/28/20 128/70  12/31/19 123/80   Pulse Readings from Last 3 Encounters:  09/01/21 70  04/28/20 70  12/31/19 86   Wt Readings from Last 3 Encounters:  09/01/21 157 lb 4 oz (71.3 kg)  04/28/20 163 lb (73.9 kg)  01/07/20 162 lb (73.5 kg)    Assessment: Review of patient past medical history, allergies, medications, health status, including review of consultants reports, laboratory and other test data, was performed as part of comprehensive evaluation and provision of chronic care management services.   SDOH:  (Social Determinants of Health) assessments and interventions performed: No, done within a year Financial Resource Strain: Low Risk  (02/05/2021)   Overall Financial Resource Strain (CARDIA)    Difficulty of Paying Living Expenses: Not hard at all    SDOH Interventions    Flowsheet Row Clinical Support from 02/05/2021 in [place] from 01/07/2020 in [place]  SDOH Interventions    Housing Interventions Intervention Not Indicated --  Transportation Interventions Intervention Not Indicated --  Financial Strain Interventions Intervention Not Indicated --   Physical Activity Interventions Intervention Not Indicated Intervention Not Indicated  [pt has set a goal to be more active]  Stress Interventions Intervention Not Indicated --  Social Connections Interventions Intervention Not Indicated --       CCM Care Plan  Allergies  Allergen Reactions   Lisinopril Swelling    Caused throat and tongue to swell (breathing was not affected, however)   Metformin And Related Nausea And Vomiting   Darvocet [Propoxyphene N-Acetaminophen] Nausea And Vomiting   Demerol [Meperidine] Nausea And Vomiting   Hydrochlorothiazide  Nausea And Vomiting   Percocet [Oxycodone-Acetaminophen] Nausea And Vomiting   Vicodin [Hydrocodone-Acetaminophen] Nausea And Vomiting    Medications Reviewed Today     Reviewed by Edythe Clarity, [facility] (Pharmacist) on 12/16/21 at 1325  Med List Status: <None>   Medication Order Taking? Sig Documenting Provider Last Dose Status Informant  ACCU-CHEK SOFTCLIX LANCETS lancets [id number] Yes Use as instructed to test sugars 2-3 times daily. E11.9 Midge Minium, MD Taking Active Self  Alcohol Swabs (B-D SINGLE USE SWABS REGULAR) PADS [id number] Yes USE ONE SWAB EACH TIME SUGARS ARE TESTED AS DIRECTED Midge Minium, MD Taking Active Self  atorvastatin (LIPITOR) 40 MG tablet [id number] Yes Take 1 tablet (40 mg total) by mouth daily. Midge Minium, MD Taking Active   azelastine (ASTELIN) 0.1 % nasal spray [id number] Yes Place 1 spray into both nostrils 2 (two) times daily. Use in each nostril as directed Maximiano Coss, NP Taking Active   Blood Glucose Monitoring Suppl (ACCU-CHEK GUIDE) w/Device KIT [id number] Yes 1 each by Does not apply route 3 (three) times daily. Midge Minium, MD Taking Active Self  Cholecalciferol (VITAMIN D3) 2000 units TABS [id number] Yes Take 2,000 Units by mouth daily.  [provider] Taking Active Self  EPINEPHrine 0.3 mg/0.3 mL IJ SOAJ injection [id number] Yes Inject 0.3 mLs (0.3 mg  total) into the muscle as needed for anaphylaxis. Ward, Delice Bison, DO Taking Active   glipiZIDE (GLUCOTROL XL) 5 MG 24 hr tablet [id number] Yes TAKE 1 TABLET BY MOUTH EVERY DAY WITH BREAKFAST Midge Minium, MD Taking Active   glucose blood (ACCU-CHEK GUIDE) test strip [id number] Yes Use as instructed Midge Minium, MD Taking Active Self  JANUMET 50-500 MG tablet [id number] Yes TAKE 1 TABLET BY MOUTH TWICE A DAY WITH MEALS Midge Minium, MD Taking Active   triamcinolone ointment (KENALOG) 0.1 % [id number] Yes Apply 1 application  topically 2 (two) times daily. Midge Minium, MD Taking Active   zinc gluconate 50 MG tablet [id number] Yes Take 50 mg by mouth daily. 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   Type 2 diabetes mellitus with complication, with long-term current use of insulin ([place]) 06/09/2012   General medical examination 11/26/2010   Colon cancer screening 11/26/2010   Hyperlipidemia 11/26/2010    Immunization History  Administered Date(s) Administered   Fluad Quad(high Dose 65+) 01/03/2019, 12/31/2019   HIB (PRP-T) 01/29/2016   Influenza, High Dose Seasonal PF 12/27/2016   Meningococcal Mcv4o 01/29/2016   Pneumococcal Conjugate-13 05/08/2014   Pneumococcal Polysaccharide-23 06/27/2012   Conditions to be addressed/monitored: HLD, DMII, and Osteopenia   Care Plan : ccm pharmacy  Updates made by Edythe Clarity, RPH since 12/16/2021 12:00 AM     Problem: hld dmii osteoporosis   Priority: High      Long-Range Goal: Disease management   Start Date: 12/03/2020  Expected End Date: 12/03/2021  Recent Progress: On track  Priority: High  Note:    Current Barriers:  Glucose creeping up  Pharmacist Clinical Goal(s):  Patient will verbalize ability to afford treatment regimen achieve adherence to monitoring guidelines and medication adherence to achieve therapeutic efficacy through collaboration with PharmD and provider.   Interventions: 1:1 collaboration with Midge Minium, MD regarding development and update of comprehensive plan of care as evidenced by provider attestation and co-signature Inter-disciplinary care team collaboration (see longitudinal plan of care) Comprehensive medication review performed; medication list updated in electronic medical record  Hyperlipidemia: (LDL goal < 100) 12/16/21 -Uncontrolled,  based on most recent lipids -Current treatment: Atorvastatin 40 mg once Appropriate, Query effective, ,  -Reviewed for side effects - no problems noted   -Educated on Cholesterol goals;  Importance of limiting foods high in cholesterol; -Counseled on diet and exercise extensively Recommended to continue current medication No change in dose needed at this time, work hard on diet and exercise. Recheck lipids in one year to evaluate need for escalation.  Diabetes (A1c goal <7%) 12/16/21 -Controlled, based on last A1c of 6.7 but that was back in June -No ACEi/ARB due to angioedema with lisinopril  -Current medications: Glipizide XL 3m daily Appropriate, Query effective,  -Medications previously tried: Metformin (n/v), Janumet -Current home glucose readings fasting glucose: 140s lately off of Janumet post prandial glucose: n/a -Denies hypoglycemic/hyperglycemic symptoms -Current meal patterns: same unchanged  -Current exercise: active with work.  -Educated on Complications of diabetes including kidney damage, retinal damage, and cardiovascular disease; Due for  diabetic eye exam. Patient is concerned with creeping glucose readings.  They used to be in the 110's while she was taking Janumet and now closer to the 140s.  Unsure if she was really supposed to stop Janumet, most recent GFR was around 38. I have asked her to get another visit with you to recheck A1c and kidney function. We could also consider Farxiga to replace Janumet if she needs additional therapy for elevated glucose.   Patient Goals/Self-Care Activities Patient will:  - take medications as prescribed  Follow Up Plan: 3 month DM call, PharmD 6 months  Medication Assistance: None required.  Patient affirms current coverage meets needs.  Patient's [facility] pharmacy [facility]  [id number] [address]: [phone]: [phone] (Now [facility]) - [place]one: [phone]: [phone] Up:  Patient agrees to Care Plan and Follow-up.       Compliance/Adherence/Medication fill history: Care Gaps: Eye exam  Star-Rating Drugs: Atorvastatin 4029m/18/23 90ds   Future Appointments  Date Time Provider DepParowan2/14/2023  2:00 PM TabMidge MiniumD [facility] PECRussell GardensharmD Clinical Pharmacist  [phone]

## 2021-12-16 NOTE — Patient Instructions (Addendum)
Visit Information   Goals Addressed             This Visit's Progress    Monitor and Manage My Blood Sugar-Diabetes Type 2       Timeframe:  Long-Range Goal Priority:  High Start Date: 12/16/21                            Expected End Date:  06/15/21                     Follow Up Date 03/17/22    - check blood sugar at prescribed times - check blood sugar before and after exercise - check blood sugar if I feel it is too high or too low    Why is this important?   Checking your blood sugar at home helps to keep it from getting very high or very low.  Writing the results in a diary or log helps the doctor know how to care for you.  Your blood sugar log should have the time, date and the results.  Also, write down the amount of insulin or other medicine that you take.  Other information, like what you ate, exercise done and how you were feeling, will also be helpful.     Notes:        Patient Care Plan: ccm pharmacy     Problem Identified: hld dmii osteoporosis   Priority: High     Long-Range Goal: Disease management   Start Date: 12/03/2020  Expected End Date: 12/03/2021  Recent Progress: On track  Priority: High  Note:    Current Barriers:  Glucose creeping up  Pharmacist Clinical Goal(s):  Patient will verbalize ability to afford treatment regimen achieve adherence to monitoring guidelines and medication adherence to achieve therapeutic efficacy through collaboration with PharmD and provider.   Interventions: 1:1 collaboration with Midge Minium, MD regarding development and update of comprehensive plan of care as evidenced by provider attestation and co-signature Inter-disciplinary care team collaboration (see longitudinal plan of care) Comprehensive medication review performed; medication list updated in electronic medical record  Hyperlipidemia: (LDL goal < 100) 12/16/21 -Uncontrolled,  based on most recent lipids -Current treatment: Atorvastatin 40 mg  once Appropriate, Query effective, ,  -Reviewed for side effects - no problems noted   -Educated on Cholesterol goals;  Importance of limiting foods high in cholesterol; -Counseled on diet and exercise extensively Recommended to continue current medication No change in dose needed at this time, work hard on diet and exercise. Recheck lipids in one year to evaluate need for escalation.  Diabetes (A1c goal <7%) 12/16/21 -Controlled, based on last A1c of 6.7 but that was back in June -No ACEi/ARB due to angioedema with lisinopril  -Current medications: Glipizide XL '5mg'$  daily Appropriate, Query effective,  -Medications previously tried: Metformin (n/v), Janumet -Current home glucose readings fasting glucose: 140s lately off of Janumet post prandial glucose: n/a -Denies hypoglycemic/hyperglycemic symptoms -Current meal patterns: same unchanged  -Current exercise: active with work.  -Educated on Complications of diabetes including kidney damage, retinal damage, and cardiovascular disease; Due for diabetic eye exam. Patient is concerned with creeping glucose readings.  They used to be in the 110's while she was taking Janumet and now closer to the 140s.  Unsure if she was really supposed to stop Janumet, most recent GFR was around 38. I have asked her to get another visit with you to recheck A1c and kidney  function. We could also consider Farxiga to replace Janumet if she needs additional therapy for elevated glucose.   Patient Goals/Self-Care Activities Patient will:  - take medications as prescribed  Follow Up Plan: 3 month DM call, PharmD 6 months  Medication Assistance: None required.  Patient affirms current coverage meets needs.  Patient[facility]  [id number]Phone: [phone]: [phone] HE[facility]condidoMail Delivery (Now [facility]) -  [place] [place]Phone: [phone]: [phone] Follow Up:  Patient agrees to Care Plan and Follow-up.         The patient verbalized understanding of instructions, educational materials, and care plan provided today and DECLINED offer to receive copy of patient instructions, educational materials, and care plan.  Telephone follow up appointment with pharmacy team member scheduled for: 3 months  CEdythe Clarity [place] PharmD Clinical Pharmacist  [facility]([phone]

## 2021-12-29 ENCOUNTER — Telehealth: Payer: Self-pay | Admitting: Family Medicine

## 2021-12-29 NOTE — Telephone Encounter (Signed)
She can add 2 ibuprofen every 8 hrs as needed for pain or 1 Aleve every 12 hrs.  These can be used in addition to the Tylenol

## 2021-12-29 NOTE — Telephone Encounter (Signed)
Patient is only taking the tylenol when she wakes up, she did take it last night before bed and it didn't seem to help. Patient states that normally the pain goes away during the day but today it's not. Pain is located in her L hip and goes all the way down her leg. Has been going on since Friday. Doesn't remember doing anything out of the normal, she went to a soccer game over the weekend and sat in a lawn chair for 3 hours and she initially thought that was what caused it but other than that nothing else has been unusual. I did inform patient that she could take Tylenol every 4-6 hours as needed throughout the day to which she stated if it didn't help last night why should she continue to take it? I let her know that I would get all of this information to Dr Birdie Riddle to see what she would recommend.

## 2021-12-29 NOTE — Telephone Encounter (Signed)
Caller name: Anda Kraft   On DPR? :yes/no: Yes  Call back number: [phone]  Provider they see: Birdie Riddle  Reason for call:  has appointment 12/30/21 for leg pain, but was wondering if in the meantime, there is something OTC besides tylenol that she could try to help alleviate some of the pain.

## 2021-12-29 NOTE — Telephone Encounter (Signed)
Spoke to the pt and informed her per Dr Birdie Riddle add 2 ibuprofen every 8 hrs as needed for pain or 1 Aleve every 12 hrs.  These can be used in addition to the Tylenol. If this does not help she will need an apt

## 2021-12-30 ENCOUNTER — Ambulatory Visit: Payer: Medicare HMO | Admitting: Family Medicine

## 2022-01-08 ENCOUNTER — Ambulatory Visit (INDEPENDENT_AMBULATORY_CARE_PROVIDER_SITE_OTHER): Payer: Medicare HMO | Admitting: Family Medicine

## 2022-01-08 ENCOUNTER — Encounter: Payer: Self-pay | Admitting: Family Medicine

## 2022-01-08 VITALS — BP 130/80 | HR 77 | Temp 97.8°F | Resp 16 | Ht 65.0 in | Wt 161.4 lb

## 2022-01-08 DIAGNOSIS — M5410 Radiculopathy, site unspecified: Secondary | ICD-10-CM

## 2022-01-08 DIAGNOSIS — Z23 Encounter for immunization: Secondary | ICD-10-CM

## 2022-01-08 MED ORDER — TIZANIDINE HCL 4 MG PO TABS: 4.0000 mg | ORAL_TABLET | Freq: Four times a day (QID) | ORAL | 0 refills | Status: DC | PRN

## 2022-01-08 MED ORDER — PREDNISONE 10 MG PO TABS: ORAL_TABLET | ORAL | 0 refills | Status: DC

## 2022-01-08 NOTE — Patient Instructions (Signed)
Follow up as needed or as scheduled START the Prednisone as directed- take 3 at the same time x3 days, then 2 at the same time x3 days, and then 1 daily.  Take w/ food USE the Tizanidine as needed for muscle spasm- especially before bed! Call with any questions or concerns Hang in there!!!

## 2022-01-08 NOTE — Progress Notes (Signed)
   Subjective:    Patient ID: Tracy Williams, female    DOB: 06-27-1945, 76 y.o.   MRN: [id number]  HPI Hip pain- started after attending a soccer game 2 weeks ago.  L sided.  Pain subsides during the day, but is terrible at night.  Not able to sleep.  'as soon as I lie down it starts.  And once it starts, there's no stopping it.  No pills, nothing'.  Not TTP.  Not exacerbated by particular movement other than lying down.     Review of Systems For ROS see HPI     Objective:   Physical Exam Vitals reviewed.  Constitutional:      General: She is not in acute distress.    Appearance: Normal appearance. She is not ill-appearing.  HENT:     Head: Normocephalic and atraumatic.  Cardiovascular:     Pulses: Normal pulses.  Musculoskeletal:        General: No tenderness (no TTP over trochanteric bursa or low back or buttock).  Skin:    General: Skin is warm and dry.     Findings: No rash.  Neurological:     General: No focal deficit present.     Mental Status: She is alert and oriented to person, place, and time.     Comments: (-) SLR bilaterally  Psychiatric:        Mood and Affect: Mood normal.        Behavior: Behavior normal.        Thought Content: Thought content normal.           Assessment & Plan:  Radicular pain of L lower extremity- new.  Pt reports sxs started 2 weeks ago.  Only occur when she is lying down.  Once pain start, no OTC medications help.  Given that it only occurs when lying down, suspect this is related to her back.  She does have a reported 'vacuum phenomenon' of L5-S1 on previous CT scan.  Start prednisone taper and Tizanidine.  If no improvement will need imaging and referral.  Pt expressed understanding and is in agreement w/ plan.

## 2022-01-11 ENCOUNTER — Encounter: Payer: Self-pay | Admitting: Family Medicine

## 2022-02-04 ENCOUNTER — Encounter: Payer: Self-pay | Admitting: Family Medicine

## 2022-02-04 MED ORDER — TIZANIDINE HCL 4 MG PO TABS
4.0000 mg | ORAL_TABLET | Freq: Four times a day (QID) | ORAL | 0 refills | Status: DC | PRN
Start: 2022-02-04 — End: 2022-03-04

## 2022-02-04 NOTE — Telephone Encounter (Signed)
Patient is asking about tizanidine refill notes leg pain has returned but is not as sever as previous episode.  Patient is requesting a refill of the following medications: Requested Prescriptions   Pending Prescriptions Disp Refills   tiZANidine (ZANAFLEX) 4 MG tablet 30 tablet 0    Sig: Take 1 tablet (4 mg total) by mouth every 6 (six) hours as needed for muscle spasms.    Date of patient request: 02/04/22 Last office visit: 01/08/22 Date of last refill: 01/08/22 Last refill amount: 30 Follow up time period per chart: 03/04/22

## 2022-02-16 DIAGNOSIS — N1832 Chronic kidney disease, stage 3b: Secondary | ICD-10-CM | POA: Diagnosis not present

## 2022-02-16 DIAGNOSIS — K8689 Other specified diseases of pancreas: Secondary | ICD-10-CM | POA: Diagnosis not present

## 2022-02-16 DIAGNOSIS — I129 Hypertensive chronic kidney disease with stage 1 through stage 4 chronic kidney disease, or unspecified chronic kidney disease: Secondary | ICD-10-CM | POA: Diagnosis not present

## 2022-02-16 DIAGNOSIS — T783XXA Angioneurotic edema, initial encounter: Secondary | ICD-10-CM | POA: Diagnosis not present

## 2022-02-16 DIAGNOSIS — E1122 Type 2 diabetes mellitus with diabetic chronic kidney disease: Secondary | ICD-10-CM | POA: Diagnosis not present

## 2022-02-18 ENCOUNTER — Ambulatory Visit (INDEPENDENT_AMBULATORY_CARE_PROVIDER_SITE_OTHER): Payer: Medicare HMO | Admitting: *Deleted

## 2022-02-18 ENCOUNTER — Other Ambulatory Visit: Payer: Self-pay | Admitting: Internal Medicine

## 2022-02-18 DIAGNOSIS — N1832 Chronic kidney disease, stage 3b: Secondary | ICD-10-CM

## 2022-02-18 DIAGNOSIS — Z0000 Encounter for general adult medical examination without abnormal findings: Secondary | ICD-10-CM

## 2022-02-18 DIAGNOSIS — E1329 Other specified diabetes mellitus with other diabetic kidney complication: Secondary | ICD-10-CM

## 2022-02-18 DIAGNOSIS — K8689 Other specified diseases of pancreas: Secondary | ICD-10-CM

## 2022-02-18 DIAGNOSIS — I129 Hypertensive chronic kidney disease with stage 1 through stage 4 chronic kidney disease, or unspecified chronic kidney disease: Secondary | ICD-10-CM

## 2022-02-18 NOTE — Progress Notes (Signed)
Subjective:   Tracy Williams is a 76 y.o. female who presents for Medicare Annual (Subsequent) preventive examination.  I connected with  Aracelis Ulrey on 02/18/22 by a telephone enabled telemedicine application and verified that I am speaking with the correct person using two identifiers.   I discussed the limitations of evaluation and management by telemedicine. The patient expressed understanding and agreed to proceed.  Patient location: home  Provider location: Tele-health-home     Review of Systems     Cardiac Risk Factors include: advanced age (>68mn, >>23women);diabetes mellitus;hypertension;family history of premature cardiovascular disease     Objective:    Today's Vitals   There is no height or weight on file to calculate BMI.     02/18/2022    1:33 PM 02/05/2021    2:24 PM 08/04/2020   10:23 AM 01/07/2020   12:51 PM 01/03/2019    9:33 AM 09/29/2018   11:20 AM 01/16/2017    9:57 PM  Advanced Directives  Does Patient Have a Medical Advance Directive? Yes Yes No Yes Yes Yes Yes  Type of AIndustrial/product designerof [place]ent[place]Living will  [place]Living will H[place]ousand PalmsLiving will Living will HLomasLiving will  Does patient want to make changes to medical advance directive?     Yes (MAU/Ambulatory/Procedural Areas - Information given) No - Patient declined   Copy of [place]in Chart? No - copy requested No - copy requested  No - copy requested No - copy requested      Current Medications (verified) Outpatient Encounter Medications as of 02/18/2022  Medication Sig   ACCU-CHEK SOFTCLIX LANCETS lancets Use as instructed to test sugars 2-3 times daily. E11.9   Alcohol Swabs (B-D SINGLE USE SWABS REGULAR) PADS USE ONE SWAB EACH TIME SUGARS ARE TESTED AS DIRECTED   atorvastatin (LIPITOR) 40 MG tablet Take 1 tablet (40 mg total) by  mouth daily.   Blood Glucose Monitoring Suppl (ACCU-CHEK GUIDE) w/Device KIT 1 each by Does not apply route 3 (three) times daily.   Cholecalciferol (VITAMIN D3) 2000 units TABS Take 2,000 Units by mouth daily.    EPINEPHrine 0.3 mg/0.3 mL IJ SOAJ injection Inject 0.3 mLs (0.3 mg total) into the muscle as needed for anaphylaxis.   glipiZIDE (GLUCOTROL XL) 5 MG 24 hr tablet TAKE 1 TABLET BY MOUTH EVERY DAY WITH BREAKFAST   glucose blood (ACCU-CHEK GUIDE) test strip Use as instructed   tiZANidine (ZANAFLEX) 4 MG tablet Take 1 tablet (4 mg total) by mouth every 6 (six) hours as needed for muscle spasms.   triamcinolone ointment (KENALOG) 0.1 % Apply 1 application  topically 2 (two) times daily.   predniSONE (DELTASONE) 10 MG tablet 3 tabs x3 days and then 2 tabs x3 days and then 1 tab x3 days.  Take w/ food.   No facility-administered encounter medications on file as of 02/18/2022.    Allergies (verified) Lisinopril, Metformin and related, Darvocet [propoxyphene n-acetaminophen], Demerol [meperidine], Hydrochlorothiazide, Percocet [oxycodone-acetaminophen], and Vicodin [hydrocodone-acetaminophen]   History: Past Medical History:  Diagnosis Date   Allergy    Anemia    hx   Angio-edema    Arthritis    Diabetes mellitus without complication ([place]    Type II   Dysrhythmia    Hx: of palpitations in 2010 only   GERD (gastroesophageal reflux disease)    Hypertension    Pancreatic mass    /[facility] Endo12/08/2015  PONV (postoperative nausea and vomiting)    pt reports no PONV after biopsy on 12/24/15   Right knee DJD    Stress incontinence    Urinary frequency    Urinary urgency    Wears glasses    Past Surgical History:  Procedure Laterality Date   CESAREAN SECTION  1975   COLONOSCOPY W/ BIOPSIES AND POLYPECTOMY     Hx: of in 2012   [place] OF UTERUS     Hx: of 1978   EUS N/A 12/24/2015   Procedure: UPPER ENDOSCOPIC ULTRASOUND (EUS) RADIAL;  Surgeon: Arta Silence, MD;   Location: [facility];  Service: Endoscopy;  Laterality: N/A;   FINE NEEDLE ASPIRATION N/A 12/24/2015   Procedure: FINE NEEDLE ASPIRATION (FNA) RADIAL;  Surgeon: Arta Silence, MD;  Location: [facility];  Service: Endoscopy;  Laterality: N/A;   JOINT REPLACEMENT     Hx: of left Knee 2010   LAPAROSCOPIC DISTAL PANCREATECTOMY  02/25/2016   Diagnostic laparoscopy, laparoscopic hand-assisted    LAPAROSCOPIC SPLENECTOMY N/A 02/25/2016   Procedure: LAPAROSCOPIC SPLENECTOMY;  Surgeon: Stark Klein, MD;  Location: [facility];  Service: General;  Laterality: N/A;   PANCREATECTOMY N/A 02/25/2016   Procedure: LAPAROSCOPIC DISTAL PANCREATECTOMY;  Surgeon: Stark Klein, MD;  Location: [facility];  Service: General;  Laterality: N/A;   SPLENECTOMY  02/25/2016   TOTAL KNEE ARTHROPLASTY  2010   TOTAL KNEE ARTHROPLASTY Right 06/26/2012   Dr Noemi Chapel   TOTAL KNEE ARTHROPLASTY Right 06/26/2012   Procedure: TOTAL KNEE ARTHROPLASTY- right;  Surgeon: Lorn Junes, MD;  Location: [facility];  Service: Orthopedics;  Laterality: Right;   Family History  Problem Relation Age of Onset   Diabetes Mellitus II Father    Hypertension Father    Cancer - Other Mother        Breast   Breast cancer Mother 63   Social History   Socioeconomic History   Marital status: Widowed    Spouse name: Not on file   Number of children: Not on file   Years of education: Not on file   Highest education level: Not on file  Occupational History   Occupation: retired  Tobacco Use   Smoking status: Never   Smokeless tobacco: Never  Vaping Use   Vaping Use: Never used  Substance and Sexual Activity   Alcohol use: No   Drug use: No   Sexual activity: Not Currently  Other Topics Concern   Not on file  Social History Narrative   Not on file   Social Determinants of Health   Financial Resource Strain: Low Risk  (02/18/2022)   Overall Financial Resource Strain (CARDIA)    Difficulty of Paying Living Expenses: Not hard at all  Food  Insecurity: No Food Insecurity (02/18/2022)   Hunger Vital Sign    Worried About Running Out of Food in the Last Year: Never true    [place] in the Last Year: Never true  Transportation Needs: No Transportation Needs (02/18/2022)   PRAPARE - Hydrologist (Medical): No    Lack of Transportation (Non-Medical): No  Physical Activity: Unknown (02/18/2022)   Exercise Vital Sign    Days of Exercise per Week: Not on file    Minutes of Exercise per Session: 0 min  Stress: No Stress Concern Present (02/18/2022)   [place]    Feeling of Stress : Not at all  Social Connections: Moderately Integrated (02/18/2022)  Social Connection and Isolation Panel [NHANES]    Frequency of Communication with Friends and Family: Not on file    Frequency of Social Gatherings with Friends and Family: Three times a week    Attends Religious Services: More than 4 times per year    Active Member of Clubs or Organizations: Yes    Attends Archivist Meetings: More than 4 times per year    Marital Status: Widowed    Tobacco Counseling Counseling given: Not Answered   Clinical Intake:  Pre-visit preparation completed: Yes  Pain : No/denies pain     Diabetes: Yes CBG done?: No Did pt. bring in CBG monitor from home?: No  How often do you need to have someone help you when you read instructions, pamphlets, or other written materials from your doctor or pharmacy?: 1 - Never  Diabetic?  Yes  Nutrition Risk Assessment:  Has the patient had any N/V/D within the last 2 months?  No  Does the patient have any non-healing wounds?  No  Has the patient had any unintentional weight loss or weight gain?  No   Diabetes:  Is the patient diabetic?  No  If diabetic, was a CBG obtained today?  Yes  Did the patient bring in their glucometer from home?  No  How often do you monitor your CBG's?  Does not  check.   Financial Strains and Diabetes Management:  Are you having any financial strains with the device, your supplies or your medication? No .  Does the patient want to be seen by Chronic Care Management for management of their diabetes?  No  Would the patient like to be referred to a Nutritionist or for Diabetic Management?  No   Diabetic Exams:  Diabetic Eye Exam: Completed .Pt has been advised about the importance in completing this exam.  Diabetic Foot Exam:  Pt has been advised about the importance in completing this exam.   Interpreter Needed?: No  Information entered by :: Leroy Kennedy LPN   Activities of Daily Living    02/18/2022    1:39 PM  In your present state of health, do you have any difficulty performing the following activities:  Hearing? 0  Vision? 0  Difficulty concentrating or making decisions? 0  Walking or climbing stairs? 0  Dressing or bathing? 0  Doing errands, shopping? 0  Preparing Food and eating ? N  Using the Toilet? N  In the past six months, have you accidently leaked urine? N  Do you have problems with loss of bowel control? N  Managing your Medications? N  Managing your Finances? N  Housekeeping or managing your Housekeeping? N    Patient Care Team: Midge Minium, MD as PCP - General (Family Medicine) Clarene Essex, MD as Consulting Physician (Gastroenterology) Elsie Saas, MD as Consulting Physician (Orthopedic Surgery) Belva Crome, MD as Consulting Physician (Cardiology) Sharyne Peach, MD as Consulting Physician (Ophthalmology) Edythe Clarity, [facility] (Pharmacist)  Indicate any recent Medical Services you may have received from other than [facility] providers in the past year (date may be approximate).     Assessment:   This is a routine wellness examination for [place].  Hearing/Vision screen Hearing Screening - Comments:: No trouble hearing Vision Screening - Comments:: Up to date [facility]  Dietary issues and  exercise activities discussed: Current Exercise Habits: The patient does not participate in regular exercise at present   Goals Addressed  This Visit's Progress    Patient Stated       Would like to completley retire       Depression Screen    02/18/2022    1:40 PM 01/08/2022   11:33 AM 09/01/2021   10:36 AM 02/05/2021    2:26 PM 02/05/2021    2:23 PM 02/03/2021   11:48 AM 09/03/2020   12:56 PM  PHQ 2/9 Scores  PHQ - 2 Score 0 0 0 0 0 0 0  PHQ- 9 Score 0 7 0    0    Fall Risk    02/18/2022    1:36 PM 02/18/2022    1:33 PM 01/08/2022   11:33 AM 09/01/2021   10:36 AM 02/05/2021    2:25 PM  [place] in the past year? 0 0 0 0 0  Number falls in past yr: 0 0  0 0  Injury with Fall? 0 0  0 0  Risk for fall due to :   No Fall Risks No Fall Risks   Follow up Falls evaluation completed;Education provided;Falls prevention discussed Falls evaluation completed;Falls prevention discussed;Education provided Falls evaluation completed Falls evaluation completed Falls evaluation completed    FALL RISK PREVENTION PERTAINING TO THE HOME:  Any stairs in or around the home? No  If so, are there any without handrails? No  Home free of loose throw rugs in walkways, pet beds, electrical cords, etc? Yes  Adequate lighting in your home to reduce risk of falls? Yes   ASSISTIVE DEVICES UTILIZED TO PREVENT FALLS:  Life alert? No  Use of a cane, walker or w/c? No  Grab bars in the bathroom? Yes  Shower chair or bench in shower? No  Elevated toilet seat or a handicapped toilet? No   TIMED UP AND GO:  Was the test performed? No .    Cognitive Function:        Immunizations Immunization History  Administered Date(s) Administered   Fluad Quad(high Dose 65+) 01/03/2019, 12/31/2019   HIB (PRP-T) 01/29/2016   Influenza, High Dose Seasonal PF 12/27/2016, 01/08/2022   Meningococcal Mcv4o 01/29/2016   Pneumococcal Conjugate-13 05/08/2014   Pneumococcal  Polysaccharide-23 06/27/2012    TDAP status: Due, Education has been provided regarding the importance of this vaccine. Advised may receive this vaccine at local pharmacy or Health Dept. Aware to provide a copy of the vaccination record if obtained from local pharmacy or Health Dept. Verbalized acceptance and understanding.  Flu Vaccine status: Due, Education has been provided regarding the importance of this vaccine. Advised may receive this vaccine at local pharmacy or Health Dept. Aware to provide a copy of the vaccination record if obtained from local pharmacy or Health Dept. Verbalized acceptance and understanding.  Pneumococcal vaccine status: Up to date  Covid-19 vaccine status: Information provided on how to obtain vaccines.   Qualifies for Shingles Vaccine? Yes   Zostavax completed No   Shingrix Completed?: No.    Education has been provided regarding the importance of this vaccine. Patient has been advised to call insurance company to determine out of pocket expense if they have not yet received this vaccine. Advised may also receive vaccine at local pharmacy or Health Dept. Verbalized acceptance and understanding.  Screening Tests Health Maintenance  Topic Date Due   HEMOGLOBIN A1C  03/03/2022   MAMMOGRAM  07/08/2022   Diabetic kidney evaluation - Urine ACR  09/02/2022   FOOT EXAM  09/02/2022   Diabetic kidney evaluation - GFR measurement  10/16/2022   OPHTHALMOLOGY EXAM  12/16/2022   Medicare Annual Wellness (AWV)  02/19/2023   Pneumonia Vaccine 8+ Years old  Completed   INFLUENZA VACCINE  Completed   DEXA SCAN  Completed   HPV VACCINES  Aged Out   DTaP/Tdap/Td  Discontinued   COLONOSCOPY (Pts 45-24yr Insurance coverage will need to be confirmed)  Discontinued   COVID-19 Vaccine  Discontinued   Hepatitis C Screening  Discontinued   Zoster Vaccines- Shingrix  Discontinued    Health Maintenance  There are no preventive care reminders to display for this  patient.   Colorectal cancer screening: No longer required.   Mammogram status: Completed 2022. Repeat every year  Bone Density status: Completed 2022. Results reflect: Bone density results: OSTEOPENIA. Repeat every 5 years.  Lung Cancer Screening: (Low Dose CT Chest recommended if Age 76-80years, 30 pack-year currently smoking OR have quit w/in 15years.) does not qualify.   Lung Cancer Screening Referral:   Additional Screening:  Hepatitis C Screening:  never done  Vision Screening: Recommended annual ophthalmology exams for early detection of glaucoma and other disorders of the eye. Is the patient up to date with their annual eye exam?  Yes  Who is the provider or what is the name of the office in which the patient attends annual eye exams? [facility] If pt is not established with a provider, would they like to be referred to a provider to establish care? No .   Dental Screening: Recommended annual dental exams for proper oral hygiene  Community Resource Referral / Chronic Care Management: CRR required this visit?  No   CCM required this visit?  No      Plan:     I have personally reviewed and noted the following in the patient's chart:   Medical and social history Use of alcohol, tobacco or illicit drugs  Current medications and supplements including opioid prescriptions. Patient is not currently taking opioid prescriptions. Functional ability and status Nutritional status Physical activity Advanced directives List of other physicians Hospitalizations, surgeries, and ER visits in previous 12 months Vitals Screenings to include cognitive, depression, and falls Referrals and appointments  In addition, I have reviewed and discussed with patient certain preventive protocols, quality metrics, and best practice recommendations. A written personalized care plan for preventive services as well as general preventive health recommendations were provided to patient.     JLeroy Kennedy LPN   [id number]  Nurse Notes:

## 2022-02-18 NOTE — Patient Instructions (Addendum)
Tracy Williams , Thank you for taking time to come for your Medicare Wellness Visit. I appreciate your ongoing commitment to your health goals. Please review the following plan we discussed and let me know if I can assist you in the future.   These are the goals we discussed:  Goals      Monitor and Manage My Blood Sugar-Diabetes Type 2     Timeframe:  Long-Range Goal Priority:  High Start Date: 12/16/21                            Expected End Date:  06/15/21                     Follow Up Date 03/17/22    - check blood sugar at prescribed times - check blood sugar before and after exercise - check blood sugar if I feel it is too high or too low    Why is this important?   Checking your blood sugar at home helps to keep it from getting very high or very low.  Writing the results in a diary or log helps the doctor know how to care for you.  Your blood sugar log should have the time, date and the results.  Also, write down the amount of insulin or other medicine that you take.  Other information, like what you ate, exercise done and how you were feeling, will also be helpful.     Notes:      Patient Stated     Drink more water & increase activity by walking more     Patient Stated     Would like to completley retire        This is a list of the screening recommended for you and due dates:  Health Maintenance  Topic Date Due   DTaP/Tdap/Td vaccine (1 - Tdap) Never done   Hemoglobin A1C  03/03/2022   Mammogram  07/08/2022   Yearly kidney health urinalysis for diabetes  09/02/2022   Complete foot exam   09/02/2022   Yearly kidney function blood test for diabetes  10/16/2022   Eye exam for diabetics  12/16/2022   Medicare Annual Wellness Visit  02/19/2023   Pneumonia Vaccine  Completed   Flu Shot  Completed   DEXA scan (bone density measurement)  Completed   HPV Vaccine  Aged Out   Colon Cancer Screening  Discontinued   COVID-19 Vaccine  Discontinued   Hepatitis C Screening:  USPSTF Recommendation to screen - Ages 31-79 yo.  Discontinued   Zoster (Shingles) Vaccine  Discontinued    Advanced directives: not on file  Conditions/risks identified:   Next appointment: Follow up in one year for your annual wellness visit 03-04-2022 @ 2:00  [place]   Preventive Care 65 Years and Older, Female Preventive care refers to lifestyle choices and visits with your health care provider that can promote health and wellness. What does preventive care include? A yearly physical exam. This is also called an annual well check. Dental exams once or twice a year. Routine eye exams. Ask your health care provider how often you should have your eyes checked. Personal lifestyle choices, including: Daily care of your teeth and gums. Regular physical activity. Eating a healthy diet. Avoiding tobacco and drug use. Limiting alcohol use. Practicing safe sex. Taking low-dose aspirin every day. Taking vitamin and mineral supplements as recommended by your health care provider. What happens during  an annual well check? The services and screenings done by your health care provider during your annual well check will depend on your age, overall health, lifestyle risk factors, and family history of disease. Counseling  Your health care provider may ask you questions about your: Alcohol use. Tobacco use. Drug use. Emotional well-being. Home and relationship well-being. Sexual activity. Eating habits. History of falls. Memory and ability to understand (cognition). Work and work Statistician. Reproductive health. Screening  You may have the following tests or measurements: Height, weight, and BMI. Blood pressure. Lipid and cholesterol levels. These may be checked every 5 years, or more frequently if you are over 31 years old. Skin check. Lung cancer screening. You may have this screening every year starting at age 75 if you have a 30-pack-year history of smoking and currently smoke or  have quit within the past 15 years. Fecal occult blood test (FOBT) of the stool. You may have this test every year starting at age 39. Flexible sigmoidoscopy or colonoscopy. You may have a sigmoidoscopy every 5 years or a colonoscopy every 10 years starting at age 66. Hepatitis C blood test. Hepatitis B blood test. Sexually transmitted disease (STD) testing. Diabetes screening. This is done by checking your blood sugar (glucose) after you have not eaten for a while (fasting). You may have this done every 1-3 years. Bone density scan. This is done to screen for osteoporosis. You may have this done starting at age 63. Mammogram. This may be done every 1-2 years. Talk to your health care provider about how often you should have regular mammograms. Talk with your health care provider about your test results, treatment options, and if necessary, the need for more tests. Vaccines  Your health care provider may recommend certain vaccines, such as: Influenza vaccine. This is recommended every year. Tetanus, diphtheria, and acellular pertussis (Tdap, Td) vaccine. You may need a Td booster every 10 years. Zoster vaccine. You may need this after age 5. Pneumococcal 13-valent conjugate (PCV13) vaccine. One dose is recommended after age 59. Pneumococcal polysaccharide (PPSV23) vaccine. One dose is recommended after age 80. Talk to your health care provider about which screenings and vaccines you need and how often you need them. This information is not intended to replace advice given to you by your health care provider. Make sure you discuss any questions you have with your health care provider. Document Released: 04/04/2015 Document Revised: 11/26/2015 Document Reviewed: 01/07/2015 Elsevier Interactive Patient Education  2017 [place] Prevention in the Home Falls can cause injuries. They can happen to people of all ages. There are many things you can do to make your home safe and to help  prevent falls. What can I do on the outside of my home? Regularly fix the edges of walkways and driveways and fix any cracks. Remove anything that might make you trip as you walk through a door, such as a raised step or threshold. Trim any bushes or trees on the path to your home. Use bright outdoor lighting. Clear any walking paths of anything that might make someone trip, such as rocks or tools. Regularly check to see if handrails are loose or broken. Make sure that both sides of any steps have handrails. Any raised decks and porches should have guardrails on the edges. Have any leaves, snow, or ice cleared regularly. Use sand or salt on walking paths during winter. Clean up any spills in your garage right away. This includes oil or grease spills. What can  I do in the bathroom? Use night lights. Install grab bars by the toilet and in the tub and shower. Do not use towel bars as grab bars. Use non-skid mats or decals in the tub or shower. If you need to sit down in the shower, use a plastic, non-slip stool. Keep the floor dry. Clean up any water that spills on the floor as soon as it happens. Remove soap buildup in the tub or shower regularly. Attach bath mats securely with double-sided non-slip rug tape. Do not have throw rugs and other things on the floor that can make you trip. What can I do in the bedroom? Use night lights. Make sure that you have a light by your bed that is easy to reach. Do not use any sheets or blankets that are too big for your bed. They should not hang down onto the floor. Have a firm chair that has side arms. You can use this for support while you get dressed. Do not have throw rugs and other things on the floor that can make you trip. What can I do in the kitchen? Clean up any spills right away. Avoid walking on wet floors. Keep items that you use a lot in easy-to-reach places. If you need to reach something above you, use a strong step stool that has a  grab bar. Keep electrical cords out of the way. Do not use floor polish or wax that makes floors slippery. If you must use wax, use non-skid floor wax. Do not have throw rugs and other things on the floor that can make you trip. What can I do with my stairs? Do not leave any items on the stairs. Make sure that there are handrails on both sides of the stairs and use them. Fix handrails that are broken or loose. Make sure that handrails are as long as the stairways. Check any carpeting to make sure that it is firmly attached to the stairs. Fix any carpet that is loose or worn. Avoid having throw rugs at the top or bottom of the stairs. If you do have throw rugs, attach them to the floor with carpet tape. Make sure that you have a light switch at the top of the stairs and the bottom of the stairs. If you do not have them, ask someone to add them for you. What else can I do to help prevent falls? Wear shoes that: Do not have high heels. Have rubber bottoms. Are comfortable and fit you well. Are closed at the toe. Do not wear sandals. If you use a stepladder: Make sure that it is fully opened. Do not climb a closed stepladder. Make sure that both sides of the stepladder are locked into place. Ask someone to hold it for you, if possible. Clearly mark and make sure that you can see: Any grab bars or handrails. First and last steps. Where the edge of each step is. Use tools that help you move around (mobility aids) if they are needed. These include: Canes. Walkers. Scooters. Crutches. Turn on the lights when you go into a dark area. Replace any light bulbs as soon as they burn out. Set up your furniture so you have a clear path. Avoid moving your furniture around. If any of your floors are uneven, fix them. If there are any pets around you, be aware of where they are. Review your medicines with your doctor. Some medicines can make you feel dizzy. This can increase your chance of  falling.  Ask your doctor what other things that you can do to help prevent falls. This information is not intended to replace advice given to you by your health care provider. Make sure you discuss any questions you have with your health care provider. Document Released: 01/02/2009 Document Revised: 08/14/2015 Document Reviewed: 04/12/2014 Elsevier Interactive Patient Education  2017 [employer]. \

## 2022-03-04 ENCOUNTER — Encounter: Payer: Self-pay | Admitting: Family Medicine

## 2022-03-04 ENCOUNTER — Ambulatory Visit (INDEPENDENT_AMBULATORY_CARE_PROVIDER_SITE_OTHER): Payer: Medicare HMO | Admitting: Family Medicine

## 2022-03-04 VITALS — BP 138/70 | HR 78 | Temp 98.8°F | Resp 17 | Ht 65.0 in | Wt 158.5 lb

## 2022-03-04 DIAGNOSIS — E118 Type 2 diabetes mellitus with unspecified complications: Secondary | ICD-10-CM

## 2022-03-04 DIAGNOSIS — Z794 Long term (current) use of insulin: Secondary | ICD-10-CM

## 2022-03-04 DIAGNOSIS — Z0000 Encounter for general adult medical examination without abnormal findings: Secondary | ICD-10-CM | POA: Diagnosis not present

## 2022-03-04 DIAGNOSIS — M810 Age-related osteoporosis without current pathological fracture: Secondary | ICD-10-CM | POA: Diagnosis not present

## 2022-03-04 NOTE — Assessment & Plan Note (Signed)
UTD on DEXA.  Check Vit D and replete prn. 

## 2022-03-04 NOTE — Patient Instructions (Signed)
Follow up in 6 months to recheck sugar, cholesterol We'll notify you of your lab results and make any changes if needed Keep up the good work on healthy diet and regular exercise- you look great!!! Call with any questions or concerns Stay Safe!  Stay Healthy! Happy Holidays!!!

## 2022-03-04 NOTE — Assessment & Plan Note (Signed)
Pt's PE WNL.  UTD on mammogram, eye exam, foot exam, microalbumin, flu, PNA.  No longer doing colon cancer screening.  Check labs.  Anticipatory guidance provided.

## 2022-03-04 NOTE — Progress Notes (Signed)
Subjective:    Patient ID: Tracy Williams, female    DOB: 26-Feb-1946, 76 y.o.   MRN: [id number]  HPI CPE- UTD on mammogram, microalbumin, foot exam, eye exam, PNA, flu.  No longer doing colon cancer screening.  Patient Care Team    Relationship Specialty Notifications Start End  Midge Minium, MD PCP - General Family Medicine  10/06/11    Comment: Raelene Bott, Altamese Dilling, MD Consulting Physician Gastroenterology  04/14/15   Elsie Saas, MD Consulting Physician Orthopedic Surgery  08/12/16   Belva Crome, MD Consulting Physician Cardiology  08/12/16   Sharyne Peach, MD Consulting Physician Ophthalmology  01/07/20   Edythe Clarity, [facility]  Pharmacist  12/16/21    Comment: [phone]    Health Maintenance  Topic Date Due   HEMOGLOBIN A1C  03/03/2022   MAMMOGRAM  07/08/2022   Diabetic kidney evaluation - Urine ACR  09/02/2022   FOOT EXAM  09/02/2022   Diabetic kidney evaluation - eGFR measurement  10/16/2022   OPHTHALMOLOGY EXAM  12/16/2022   Medicare Annual Wellness (AWV)  02/19/2023   Pneumonia Vaccine 57+ Years old  Completed   INFLUENZA VACCINE  Completed   DEXA SCAN  Completed   HPV VACCINES  Aged Out   DTaP/Tdap/Td  Discontinued   COLONOSCOPY (Pts 45-83yr Insurance coverage will need to be confirmed)  Discontinued   COVID-19 Vaccine  Discontinued   Hepatitis C Screening  Discontinued   Zoster Vaccines- Shingrix  Discontinued      Review of Systems Patient reports no vision/ hearing changes, adenopathy,fever, weight change,  persistant/recurrent hoarseness , swallowing issues, chest pain, palpitations, edema, persistant/recurrent cough, hemoptysis, dyspnea (rest/exertional/paroxysmal nocturnal), gastrointestinal bleeding (melena, rectal bleeding), abdominal pain, significant heartburn, bowel changes, GU symptoms (dysuria, hematuria, incontinence), Gyn symptoms (abnormal  bleeding, pain),  syncope, focal weakness, memory loss, numbness & tingling,  skin/hair/nail changes, abnormal bruising or bleeding, anxiety, or depression.     Objective:   Physical Exam General Appearance:    Alert, cooperative, no distress, appears stated age  Head:    Normocephalic, without obvious abnormality, atraumatic  Eyes:    PERRL, conjunctiva/corneas clear, EOM's intact both eyes  Ears:    Normal TM's and external ear canals, both ears  Nose:   Nares normal, septum midline, mucosa normal, no drainage    or sinus tenderness  Throat:   Lips, mucosa, and tongue normal; teeth and gums normal  Neck:   Supple, symmetrical, trachea midline, no adenopathy;    Thyroid: no enlargement/tenderness/nodules  Back:     Symmetric, no curvature, ROM normal, no CVA tenderness  Lungs:     Clear to auscultation bilaterally, respirations unlabored  Chest Wall:    No tenderness or deformity   Heart:    Regular rate and rhythm, S1 and S2 normal, no murmur, rub   or gallop  Breast Exam:    Deferred to mammo  Abdomen:     Soft, non-tender, bowel sounds active all four quadrants,    no masses, no organomegaly  Genitalia:    Deferred  Rectal:    Extremities:   Extremities normal, atraumatic, no cyanosis or edema  Pulses:   2+ and symmetric all extremities  Skin:   Skin color, texture, turgor normal, no rashes or lesions  Lymph nodes:   Cervical, supraclavicular, and axillary nodes normal  Neurologic:   CNII-XII intact, normal strength, sensation and reflexes    throughout          Assessment & Plan:

## 2022-03-04 NOTE — Assessment & Plan Note (Signed)
UTD on foot exam, eye exam, microalbumin.  Pt has been holding Janumet due to increased Creatinine.  Will determine what meds are needed based on labs today.  Pt expressed understanding and is in agreement w/ plan.

## 2022-03-05 LAB — HEPATIC FUNCTION PANEL
ALT: 15 U/L (ref 0–35)
AST: 16 U/L (ref 0–37)
Albumin: 4.3 g/dL (ref 3.5–5.2)
Alkaline Phosphatase: 111 U/L (ref 39–117)
Bilirubin, Direct: 0.1 mg/dL (ref 0.0–0.3)
Total Bilirubin: 0.6 mg/dL (ref 0.2–1.2)
Total Protein: 7.3 g/dL (ref 6.0–8.3)

## 2022-03-05 LAB — CBC WITH DIFFERENTIAL/PLATELET
Basophils Absolute: 0.1 10*3/uL (ref 0.0–0.1)
Basophils Relative: 0.5 % (ref 0.0–3.0)
Eosinophils Absolute: 0.5 10*3/uL (ref 0.0–0.7)
Eosinophils Relative: 4.6 % (ref 0.0–5.0)
HCT: 37.6 % (ref 36.0–46.0)
Hemoglobin: 12.8 g/dL (ref 12.0–15.0)
Lymphocytes Relative: 19.8 % (ref 12.0–46.0)
Lymphs Abs: 2.3 10*3/uL (ref 0.7–4.0)
MCHC: 34 g/dL (ref 30.0–36.0)
MCV: 89.4 fl (ref 78.0–100.0)
Monocytes Absolute: 1.2 10*3/uL — ABNORMAL HIGH (ref 0.1–1.0)
Monocytes Relative: 10 % (ref 3.0–12.0)
Neutro Abs: 7.7 10*3/uL (ref 1.4–7.7)
Neutrophils Relative %: 65.1 % (ref 43.0–77.0)
Platelets: 358 10*3/uL (ref 150.0–400.0)
RBC: 4.21 Mil/uL (ref 3.87–5.11)
RDW: 13.3 % (ref 11.5–15.5)
WBC: 11.8 10*3/uL — ABNORMAL HIGH (ref 4.0–10.5)

## 2022-03-05 LAB — LDL CHOLESTEROL, DIRECT: Direct LDL: 131 mg/dL

## 2022-03-05 LAB — BASIC METABOLIC PANEL
BUN: 25 mg/dL — ABNORMAL HIGH (ref 6–23)
CO2: 27 mEq/L (ref 19–32)
Calcium: 9.8 mg/dL (ref 8.4–10.5)
Chloride: 99 mEq/L (ref 96–112)
Creatinine, Ser: 1.32 mg/dL — ABNORMAL HIGH (ref 0.40–1.20)
GFR: 39.29 mL/min — ABNORMAL LOW (ref >60.00)
Glucose, Bld: 289 mg/dL — ABNORMAL HIGH (ref 70–99)
Potassium: 4.9 mEq/L (ref 3.5–5.1)
Sodium: 136 mEq/L (ref 135–145)

## 2022-03-05 LAB — TSH: TSH: 0.97 u[IU]/mL (ref 0.35–5.50)

## 2022-03-05 LAB — VITAMIN D 25 HYDROXY (VIT D DEFICIENCY, FRACTURES): VITD: 63.78 ng/mL (ref 30.00–100.00)

## 2022-03-05 LAB — LIPID PANEL
Cholesterol: 215 mg/dL — ABNORMAL HIGH (ref 0–200)
HDL: 40.5 mg/dL (ref >39.00)
NonHDL: 174.09
Total CHOL/HDL Ratio: 5
Triglycerides: 239 mg/dL — ABNORMAL HIGH (ref 0.0–149.0)
VLDL: 47.8 mg/dL — ABNORMAL HIGH (ref 0.0–40.0)

## 2022-03-05 LAB — HEMOGLOBIN A1C: Hgb A1c MFr Bld: 11.6 % — ABNORMAL HIGH (ref 4.6–6.5)

## 2022-03-07 ENCOUNTER — Other Ambulatory Visit: Payer: Self-pay | Admitting: Family Medicine

## 2022-03-07 MED ORDER — JANUMET 50-500 MG PO TABS: 1.0000 | ORAL_TABLET | Freq: Two times a day (BID) | ORAL | 3 refills | Status: DC

## 2022-03-08 ENCOUNTER — Other Ambulatory Visit: Payer: Self-pay

## 2022-03-08 ENCOUNTER — Telehealth: Payer: Self-pay

## 2022-03-08 DIAGNOSIS — Z794 Long term (current) use of insulin: Secondary | ICD-10-CM

## 2022-03-08 NOTE — Telephone Encounter (Signed)
-----   Message from Midge Minium, MD sent at 03/07/2022  8:00 PM EST ----- Your A1C has jumped from 6.7 -->11.6!!  This indicates very poor sugar control.  We will restart your Janumet 50/'500mg'$  twice daily and please be mindful of your sugar/carb intake while we try and get this back under control.  I sent this to [facility] for you to start ASAP.  We will need to recheck a kidney function at a lab only visit in 1 month to make sure you are metabolizing the medication appropriately (BMP- dx DM)  Your cholesterol has also increased.  Please make sure you are taking your Atorvastatin '40mg'$  daily.  These numbers will likely improve as your sugar gets back under control.  Your kidney function looks better than it did in July.  This is great news!  Remainder of labs are stable and look good!

## 2022-03-08 NOTE — Telephone Encounter (Signed)
Informed pt of lab results and advised to restart her Janumet asap and to be sure to be taking her Atorvastatin . Order for repeat BMP has been placed pt will go to the [place] to have drew before Jan 15,2024

## 2022-03-09 ENCOUNTER — Other Ambulatory Visit: Payer: Medicare HMO

## 2022-03-24 ENCOUNTER — Ambulatory Visit
Admission: RE | Admit: 2022-03-24 | Discharge: 2022-03-24 | Disposition: A | Discharge status: Home / Self Care | Payer: Medicare HMO | Source: Ambulatory Visit | Attending: Internal Medicine | Admitting: Internal Medicine

## 2022-03-24 DIAGNOSIS — N1832 Chronic kidney disease, stage 3b: Secondary | ICD-10-CM

## 2022-03-24 DIAGNOSIS — K8689 Other specified diseases of pancreas: Secondary | ICD-10-CM

## 2022-03-24 DIAGNOSIS — N189 Chronic kidney disease, unspecified: Secondary | ICD-10-CM | POA: Diagnosis not present

## 2022-03-24 DIAGNOSIS — I129 Hypertensive chronic kidney disease with stage 1 through stage 4 chronic kidney disease, or unspecified chronic kidney disease: Secondary | ICD-10-CM

## 2022-03-24 DIAGNOSIS — E1329 Other specified diabetes mellitus with other diabetic kidney complication: Secondary | ICD-10-CM

## 2022-03-24 DIAGNOSIS — N2889 Other specified disorders of kidney and ureter: Secondary | ICD-10-CM | POA: Diagnosis not present

## 2022-04-15 ENCOUNTER — Other Ambulatory Visit: Payer: Self-pay | Admitting: Family Medicine

## 2022-04-22 ENCOUNTER — Encounter: Payer: Self-pay | Admitting: Family Medicine

## 2022-04-22 ENCOUNTER — Other Ambulatory Visit: Payer: Self-pay | Admitting: Family Medicine

## 2022-04-22 DIAGNOSIS — M5410 Radiculopathy, site unspecified: Secondary | ICD-10-CM

## 2022-04-22 MED ORDER — TIZANIDINE HCL 4 MG PO TABS: 4.0000 mg | ORAL_TABLET | Freq: Three times a day (TID) | ORAL | 0 refills | Status: DC | PRN

## 2022-04-22 MED ORDER — PREDNISONE 10 MG PO TABS: ORAL_TABLET | ORAL | 0 refills | Status: DC

## 2022-04-22 NOTE — Telephone Encounter (Signed)
[facility] VISIT   Patient agreed to [facility] visit and is aware that copayment and coinsurance may apply. Patient was treated using telemedicine according to accepted telemedicine protocols.  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 E11.9, Disp: 200 each, Rfl: 3   Alcohol Swabs (B-D SINGLE USE SWABS REGULAR) PADS, USE ONE SWAB EACH TIME SUGARS ARE TESTED AS DIRECTED, Disp: 300 each, Rfl: 0   atorvastatin (LIPITOR) 40 MG tablet, TAKE 1 TABLET BY MOUTH EVERY DAY, Disp: 90 tablet, Rfl: 1   Blood Glucose Monitoring Suppl (ACCU-CHEK GUIDE) w/Device KIT, 1 each by Does not apply route 3 (three) times daily., Disp: 1 kit, Rfl: 3   Cholecalciferol (VITAMIN D3) 2000 units TABS, Take 2,000 Units by mouth daily. , Disp: , Rfl:    EPINEPHrine 0.3 mg/0.3 mL IJ SOAJ injection, Inject 0.3 mLs (0.3 mg total) into the muscle as needed for anaphylaxis., Disp: 1 each, Rfl: 2   glipiZIDE (GLUCOTROL XL) 5 MG 24 hr tablet, TAKE 1 TABLET BY MOUTH EVERY DAY WITH BREAKFAST, Disp: 90 tablet, Rfl: 1   glucose blood (ACCU-CHEK GUIDE) test strip,  Use as instructed, Disp: 100 each, Rfl: 12   sitaGLIPtin-metformin (JANUMET) 50-500 MG tablet, Take 1 tablet by mouth 2 (two) times daily with a meal., Disp: 60 tablet, Rfl: 3  Allergies  Allergen Reactions   Lisinopril Swelling    Caused throat and tongue to swell (breathing was not affected, however)   Metformin And Related Nausea And Vomiting   Darvocet [Propoxyphene N-Acetaminophen] Nausea And Vomiting   Demerol [Meperidine] Nausea And Vomiting   Hydrochlorothiazide Nausea And Vomiting   Percocet [Oxycodone-Acetaminophen] Nausea And Vomiting   Vicodin [Hydrocodone-Acetaminophen] Nausea And Vomiting    Assessment and Plan:   Diagnosis: radicular leg pain. Please see myChart communication and orders below.   No orders of the defined types were placed in this encounter.  No orders of the defined types were placed in this encounter.   [name], MD 04/22/2022  A total of 5 minutes were spent by me to personally review the patient-generated inquiry, review patient records and data pertinent to assessment of the patient's problem, develop a management plan including generation of prescriptions and/or orders, and on subsequent communication with the patient through secure the MyChart portal service.   There is no separately reported E/M service related to this service in the past 7 days nor does the patient have an upcoming soonest available appointment for this issue. This work was completed in less than 7 days.   The patient consented to this service today (see patient agreement prior to ongoing communication). Patient counseled regarding the need  for in-person exam for certain conditions and was advised to call the office if any changing or worsening symptoms occur.   The codes to be used for the E/M service are: '[]'$   99421 for 5-10 minutes of time spent on the inquiry. '[]'$   [id number] for 11-20 minutes. '[]'$   [id number] for 21+ minutes.

## 2022-04-28 ENCOUNTER — Ambulatory Visit (INDEPENDENT_AMBULATORY_CARE_PROVIDER_SITE_OTHER): Payer: Medicare HMO | Admitting: Family Medicine

## 2022-04-28 ENCOUNTER — Encounter: Payer: Self-pay | Admitting: Family Medicine

## 2022-04-28 VITALS — BP 126/80 | HR 74 | Temp 97.8°F | Resp 16 | Ht 65.0 in | Wt 156.5 lb

## 2022-04-28 DIAGNOSIS — Z794 Long term (current) use of insulin: Secondary | ICD-10-CM

## 2022-04-28 DIAGNOSIS — M7918 Myalgia, other site: Secondary | ICD-10-CM | POA: Diagnosis not present

## 2022-04-28 DIAGNOSIS — E118 Type 2 diabetes mellitus with unspecified complications: Secondary | ICD-10-CM | POA: Diagnosis not present

## 2022-04-28 LAB — BASIC METABOLIC PANEL
BUN: 30 mg/dL — ABNORMAL HIGH (ref 6–23)
CO2: 29 mEq/L (ref 19–32)
Calcium: 9.3 mg/dL (ref 8.4–10.5)
Chloride: 102 mEq/L (ref 96–112)
Creatinine, Ser: 1.09 mg/dL (ref 0.40–1.20)
GFR: 49.39 mL/min — ABNORMAL LOW (ref >60.00)
Glucose, Bld: 157 mg/dL — ABNORMAL HIGH (ref 70–99)
Potassium: 4.6 mEq/L (ref 3.5–5.1)
Sodium: 137 mEq/L (ref 135–145)

## 2022-04-28 MED ORDER — NAPROXEN 500 MG PO TABS: 500.0000 mg | ORAL_TABLET | Freq: Two times a day (BID) | ORAL | 0 refills | Status: AC

## 2022-04-28 NOTE — Patient Instructions (Signed)
Follow up as needed or as scheduled START the Naproxen twice daily- take w/ food- for 7-10 days HEAT!!! ADD Acetaminophen (Tylenol) as needed for additional pain relief CONTINUE the Tizanidine nightly for spasm IF no improvement in the next week, let me know so we can refer to Ortho Call with any questions or concerns Hang in there!!!

## 2022-04-28 NOTE — Progress Notes (Signed)
   Subjective:    Patient ID: Tracy Williams, female    DOB: [birth year], 77 y.o.   MRN: [id number]  HPI Leg pain- pt requested Prednisone and Tizanidine for pain in R leg on 2/1.  Prescriptions were sent in for pt.  Today indicates that pain is in R hip.  Has not slept in '2 and a half nights'.  Pt reports the '30mg'$  of Prednisone was 'magic' but decreasing to '20mg'$  caused the pain to return.  Has mild pain throughout the day, worse at night.  No TTP.  Pt reports pain is primarily on outside of hip but will occasionally have groin pain.   Review of Systems For ROS see HPI     Objective:   Physical Exam Vitals reviewed.  Constitutional:      General: She is not in acute distress.    Appearance: Normal appearance. She is not ill-appearing.  HENT:     Head: Normocephalic and atraumatic.  Eyes:     Extraocular Movements: Extraocular movements intact.     Conjunctiva/sclera: Conjunctivae normal.  Musculoskeletal:        General: No tenderness (no TTP over R trochanteric bursa or along lateral glute).     Comments: No pain w/ R hip flexion, extension, internal, or external rotation.  Full ROM  Skin:    General: Skin is warm and dry.     Findings: No rash.  Neurological:     General: No focal deficit present.     Mental Status: She is alert and oriented to person, place, and time.     Coordination: Coordination normal.     Gait: Gait normal.     Deep Tendon Reflexes: Reflexes normal.     Comments: (-) SLR bilaterally  Psychiatric:        Mood and Affect: Mood normal.        Behavior: Behavior normal.        Thought Content: Thought content normal.           Assessment & Plan:  R buttock pain- new.  Pain does not seem to be bony or in the joint as she has pain-free full ROM.  Pain seems to be localized to R lateral buttock but is surprisingly not TTP.  Since pain was relieved at '30mg'$  of prednisone but not '20mg'$ , will stop taper and switch to scheduled NSAIDs x7-10 days.  Pt  to heat, use Tizanidine prn, and if no improvement will need ortho referral.  Pt expressed understanding and is in agreement w/ plan.

## 2022-04-29 ENCOUNTER — Telehealth: Payer: Self-pay

## 2022-04-29 ENCOUNTER — Other Ambulatory Visit: Payer: Self-pay

## 2022-04-29 IMAGING — MG MM DIGITAL SCREENING BILAT W/ TOMO AND CAD
8 series · 9 of 24 positions shown · non-contrast
Comparison: Previous exam(s).

CLINICAL DATA: Screening.

EXAM:
DIGITAL SCREENING BILATERAL MAMMOGRAM WITH TOMOSYNTHESIS AND CAD
TECHNIQUE: Bilateral screening digital craniocaudal and mediolateral oblique
mammograms were obtained. Bilateral screening digital breast
tomosynthesis was performed. The images were evaluated with
computer-aided detection.

[L MLO synth-2D]
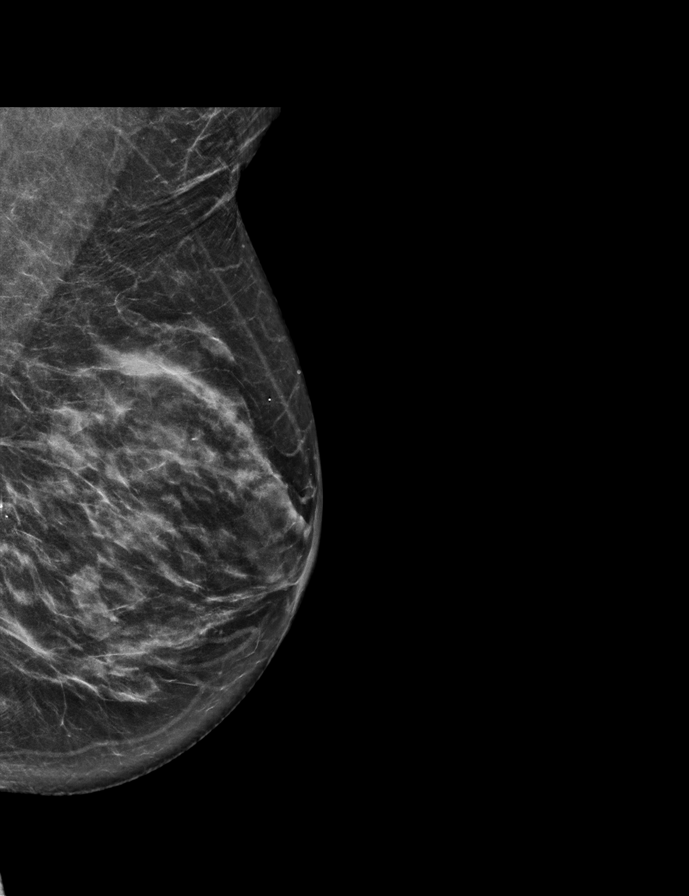

[R MLO synth-2D]
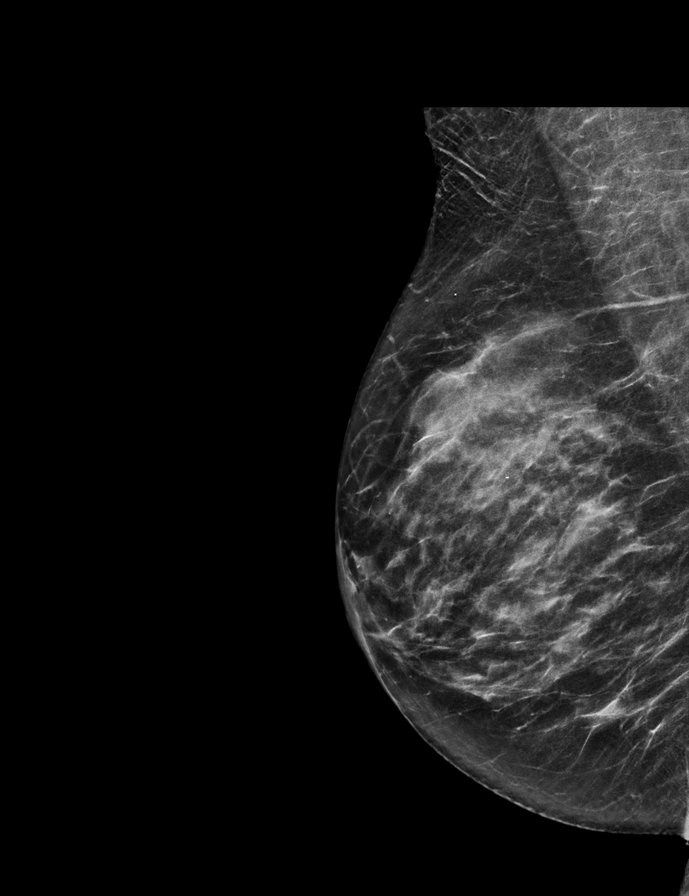

[L CC synth-2D]
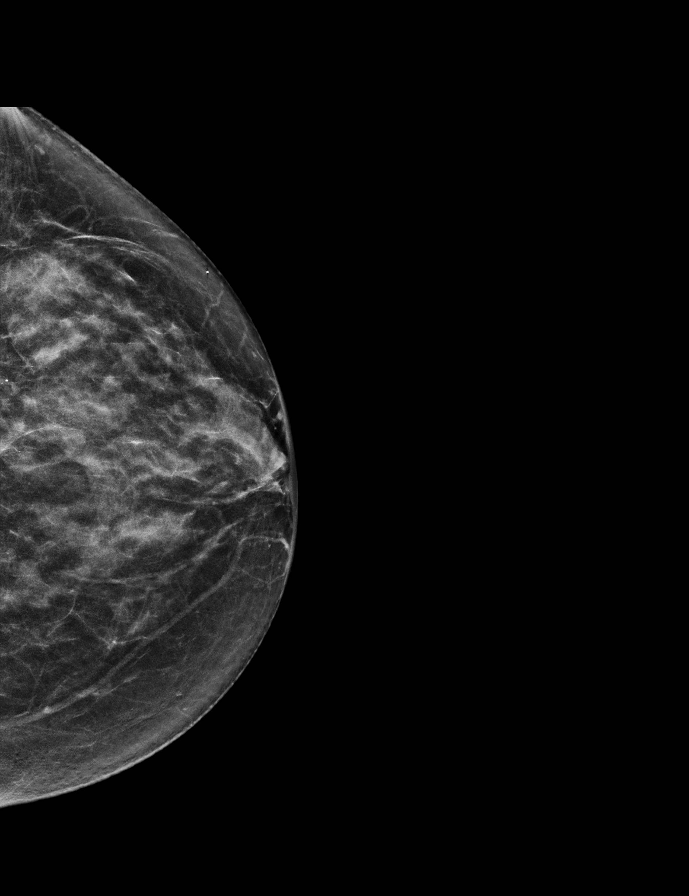

[R CC synth-2D]
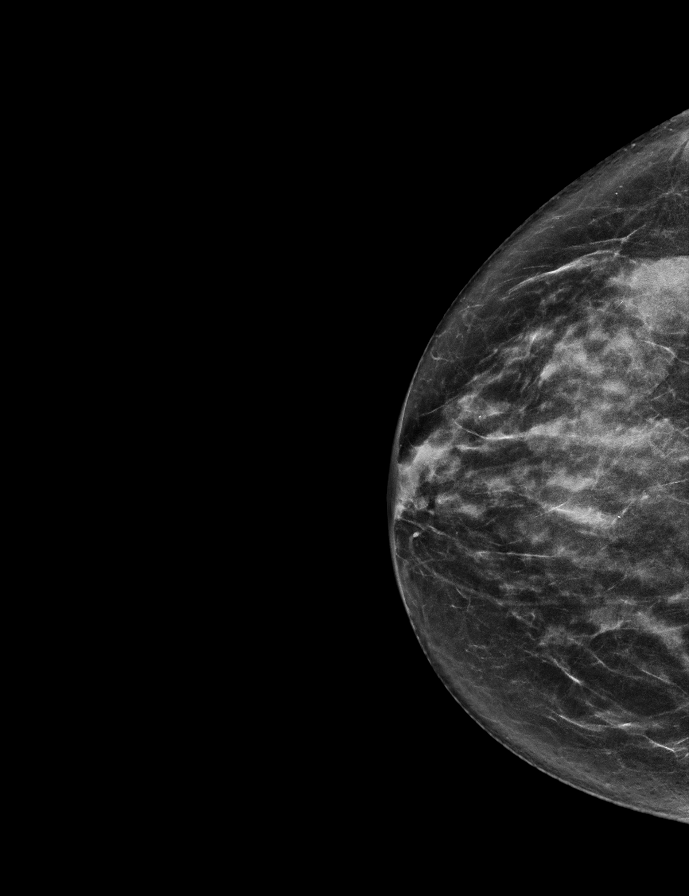

[L MLO tomo · 2 of 61 frames shown]
[frame 20/61]
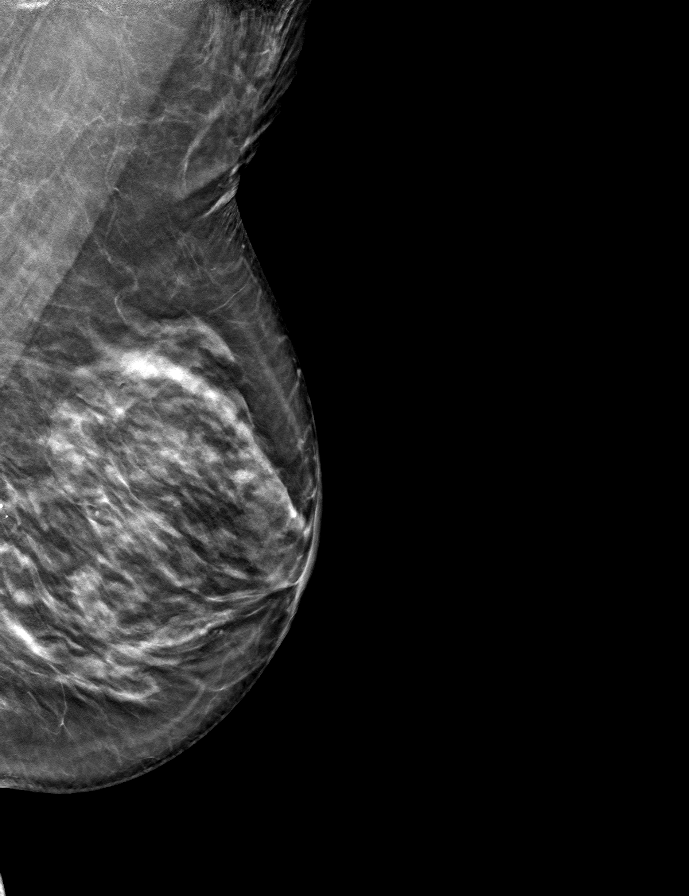
[frame 31/61]
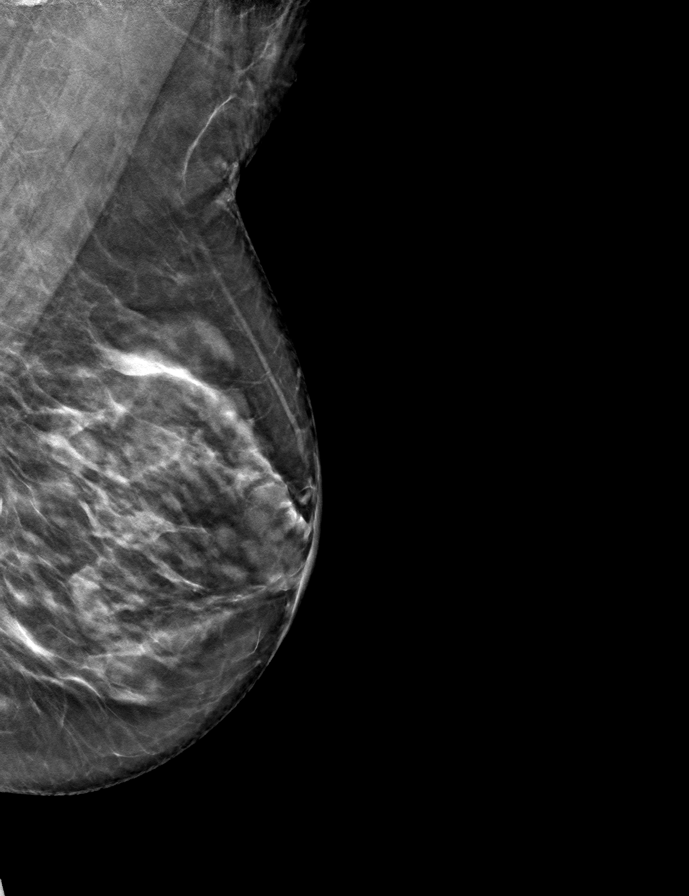

[R MLO tomo · tomo slice 30/59.0]
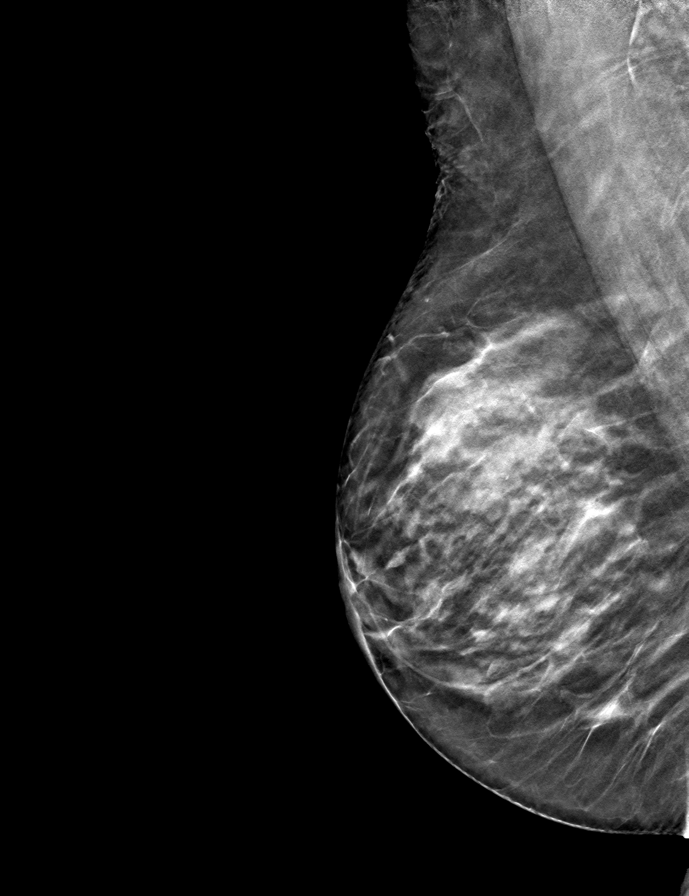

[L CC tomo · tomo slice 27/54.0]
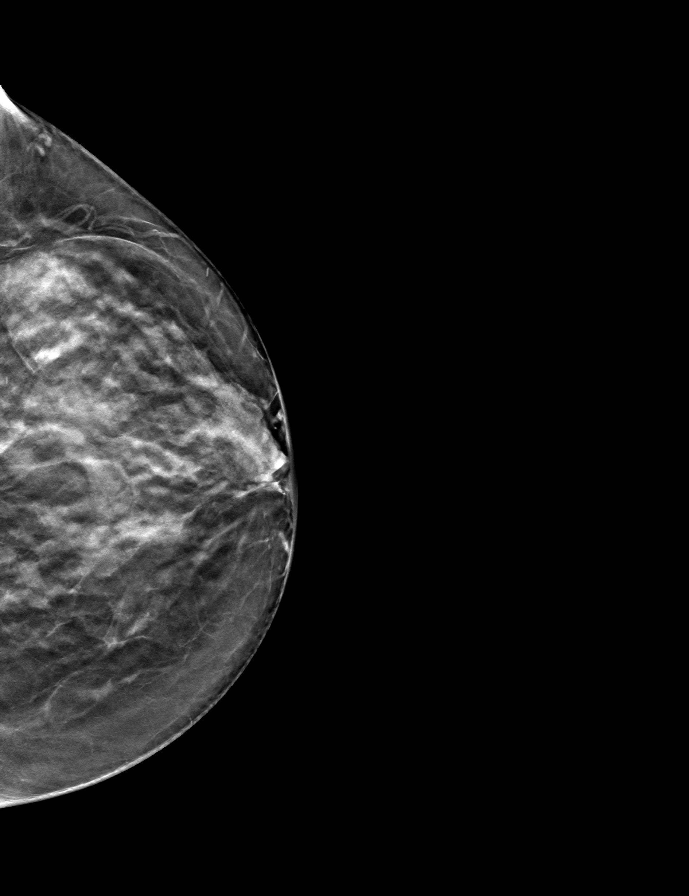

[R CC tomo · tomo slice 28/55.0]
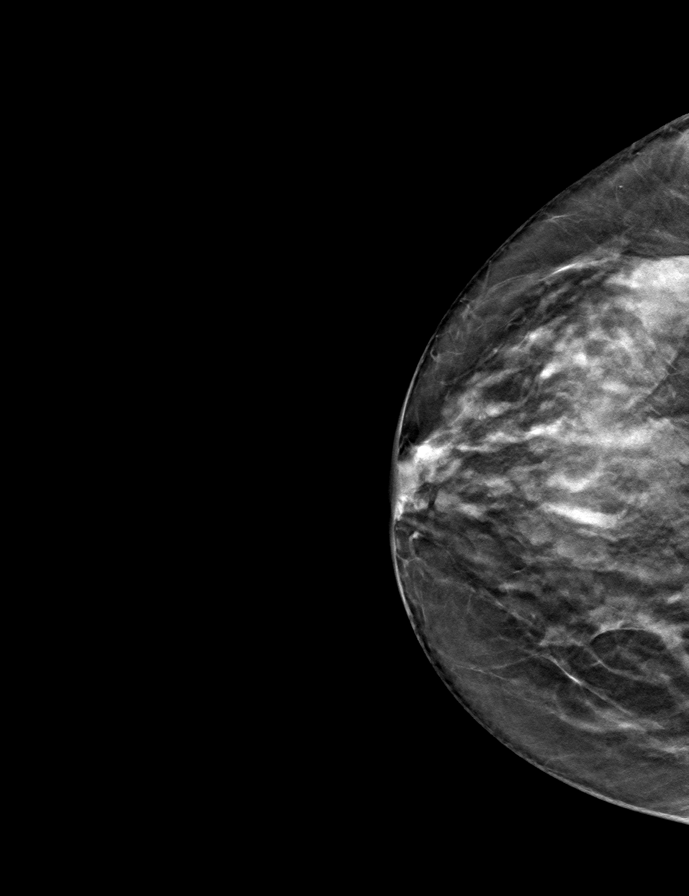

[9 of 24 positions shown; findings below may reference images not displayed]

ACR Breast Density Category c: The breast tissue is heterogeneously
dense, which may obscure small masses.
FINDINGS: There are no findings suspicious for malignancy.
IMPRESSION: No mammographic evidence of malignancy. A result letter of this
screening mammogram will be mailed directly to the patient.

RECOMMENDATION:
Screening mammogram in one year. (Code:[id number])

BI-RADS CATEGORY  1: Negative.

## 2022-04-29 MED ORDER — PREDNISONE 10 MG PO TABS: 30.0000 mg | ORAL_TABLET | Freq: Every day | ORAL | 0 refills | Status: DC

## 2022-04-29 NOTE — Telephone Encounter (Signed)
Spoke w/ pt and advised pt of Dr Birdie Riddle above message pt advise she will stop the Naproxen

## 2022-04-29 NOTE — Telephone Encounter (Signed)
She can increase back to 3 prednisone daily but she should NOT take this at the same time as the Naproxen or any other ibuprofen/motrin/aleve/etc

## 2022-04-29 NOTE — Telephone Encounter (Signed)
Informed pt of lab results . She states she is still in pain and is wanting to know if you will allow her to take 3 prednisolone a day ? She states that helped the last time with this issue

## 2022-04-29 NOTE — Telephone Encounter (Signed)
-----   Message from Midge Minium, MD sent at 04/29/2022  7:32 AM EST ----- Kidney function (Creatinine) is back to normal.  This is great news!

## 2022-05-06 ENCOUNTER — Encounter: Payer: Self-pay | Admitting: Family Medicine

## 2022-08-11 DIAGNOSIS — N1832 Chronic kidney disease, stage 3b: Secondary | ICD-10-CM | POA: Diagnosis not present

## 2022-08-16 ENCOUNTER — Other Ambulatory Visit: Payer: Self-pay | Admitting: Family Medicine

## 2022-08-20 DIAGNOSIS — E1122 Type 2 diabetes mellitus with diabetic chronic kidney disease: Secondary | ICD-10-CM | POA: Diagnosis not present

## 2022-08-20 DIAGNOSIS — E785 Hyperlipidemia, unspecified: Secondary | ICD-10-CM | POA: Diagnosis not present

## 2022-08-20 DIAGNOSIS — N1832 Chronic kidney disease, stage 3b: Secondary | ICD-10-CM | POA: Diagnosis not present

## 2022-08-20 DIAGNOSIS — I129 Hypertensive chronic kidney disease with stage 1 through stage 4 chronic kidney disease, or unspecified chronic kidney disease: Secondary | ICD-10-CM | POA: Diagnosis not present

## 2022-08-20 DIAGNOSIS — N281 Cyst of kidney, acquired: Secondary | ICD-10-CM | POA: Diagnosis not present

## 2022-08-27 ENCOUNTER — Other Ambulatory Visit: Payer: Self-pay | Admitting: Internal Medicine

## 2022-08-27 DIAGNOSIS — N1832 Chronic kidney disease, stage 3b: Secondary | ICD-10-CM

## 2022-09-03 ENCOUNTER — Ambulatory Visit: Payer: Medicare HMO | Admitting: Family Medicine

## 2022-09-06 ENCOUNTER — Ambulatory Visit
Admission: RE | Admit: 2022-09-06 | Discharge: 2022-09-06 | Disposition: A | Discharge status: Home / Self Care | Payer: Medicare HMO | Source: Ambulatory Visit | Attending: Internal Medicine | Admitting: Internal Medicine

## 2022-09-06 DIAGNOSIS — N1832 Chronic kidney disease, stage 3b: Secondary | ICD-10-CM

## 2022-09-20 ENCOUNTER — Ambulatory Visit: Payer: Medicare HMO | Admitting: Family Medicine

## 2022-09-24 ENCOUNTER — Encounter: Payer: Self-pay | Admitting: Family Medicine

## 2022-09-24 ENCOUNTER — Ambulatory Visit (INDEPENDENT_AMBULATORY_CARE_PROVIDER_SITE_OTHER): Payer: Medicare HMO | Admitting: Family Medicine

## 2022-09-24 ENCOUNTER — Telehealth: Payer: Self-pay

## 2022-09-24 VITALS — BP 116/68 | HR 69 | Temp 98.0°F | Resp 19 | Ht 65.0 in | Wt 154.4 lb

## 2022-09-24 DIAGNOSIS — L989 Disorder of the skin and subcutaneous tissue, unspecified: Secondary | ICD-10-CM

## 2022-09-24 DIAGNOSIS — E785 Hyperlipidemia, unspecified: Secondary | ICD-10-CM

## 2022-09-24 DIAGNOSIS — E118 Type 2 diabetes mellitus with unspecified complications: Secondary | ICD-10-CM

## 2022-09-24 DIAGNOSIS — Z794 Long term (current) use of insulin: Secondary | ICD-10-CM | POA: Diagnosis not present

## 2022-09-24 LAB — HEPATIC FUNCTION PANEL
ALT: 11 U/L (ref 0–35)
AST: 17 U/L (ref 0–37)
Albumin: 4.2 g/dL (ref 3.5–5.2)
Alkaline Phosphatase: 71 U/L (ref 39–117)
Bilirubin, Direct: 0.1 mg/dL (ref 0.0–0.3)
Total Bilirubin: 0.6 mg/dL (ref 0.2–1.2)
Total Protein: 6.9 g/dL (ref 6.0–8.3)

## 2022-09-24 LAB — CBC WITH DIFFERENTIAL/PLATELET
Basophils Absolute: 0.1 K/uL (ref 0.0–0.1)
Basophils Relative: 0.7 % (ref 0.0–3.0)
Eosinophils Absolute: 0.5 K/uL (ref 0.0–0.7)
Eosinophils Relative: 4.4 % (ref 0.0–5.0)
HCT: 37.8 % (ref 36.0–46.0)
Hemoglobin: 12.3 g/dL (ref 12.0–15.0)
Lymphocytes Relative: 18.9 % (ref 12.0–46.0)
Lymphs Abs: 2 K/uL (ref 0.7–4.0)
MCHC: 32.4 g/dL (ref 30.0–36.0)
MCV: 90.1 fl (ref 78.0–100.0)
Monocytes Absolute: 1 K/uL (ref 0.1–1.0)
Monocytes Relative: 9.9 % (ref 3.0–12.0)
Neutro Abs: 7 K/uL (ref 1.4–7.7)
Neutrophils Relative %: 66.1 % (ref 43.0–77.0)
Platelets: 345 K/uL (ref 150.0–400.0)
RBC: 4.19 Mil/uL (ref 3.87–5.11)
RDW: 13.7 % (ref 11.5–15.5)
WBC: 10.5 K/uL (ref 4.0–10.5)

## 2022-09-24 LAB — LIPID PANEL
Cholesterol: 180 mg/dL (ref 0–200)
HDL: 40 mg/dL
LDL Cholesterol: 110 mg/dL — ABNORMAL HIGH (ref 0–99)
NonHDL: 139.8
Total CHOL/HDL Ratio: 4
Triglycerides: 147 mg/dL (ref 0.0–149.0)
VLDL: 29.4 mg/dL (ref 0.0–40.0)

## 2022-09-24 LAB — TSH: TSH: 1.24 u[IU]/mL (ref 0.35–5.50)

## 2022-09-24 LAB — BASIC METABOLIC PANEL WITH GFR
BUN: 24 mg/dL — ABNORMAL HIGH (ref 6–23)
CO2: 28 meq/L (ref 19–32)
Calcium: 9.6 mg/dL (ref 8.4–10.5)
Chloride: 103 meq/L (ref 96–112)
Creatinine, Ser: 1.27 mg/dL — ABNORMAL HIGH (ref 0.40–1.20)
GFR: 40.99 mL/min — ABNORMAL LOW
Glucose, Bld: 113 mg/dL — ABNORMAL HIGH (ref 70–99)
Potassium: 4.4 meq/L (ref 3.5–5.1)
Sodium: 139 meq/L (ref 135–145)

## 2022-09-24 LAB — MICROALBUMIN / CREATININE URINE RATIO
Creatinine,U: 143.2 mg/dL
Microalb Creat Ratio: 0.5 mg/g (ref 0.0–30.0)
Microalb, Ur: <0.7 mg/dL (ref 0.0–1.9)

## 2022-09-24 LAB — HEMOGLOBIN A1C: Hgb A1c MFr Bld: 7.2 % — ABNORMAL HIGH (ref 4.6–6.5)

## 2022-09-24 NOTE — Assessment & Plan Note (Signed)
Chronic problem.  Currently on Lipitor 40mg daily w/o difficulty.  Check labs.  Adjust meds prn  

## 2022-09-24 NOTE — Telephone Encounter (Signed)
Pt aware of lab results 

## 2022-09-24 NOTE — Progress Notes (Signed)
   Subjective:    Patient ID: Tracy Williams, female    DOB: Oct 12, 1945, 77 y.o.   MRN: [id number]  HPI DM- chronic problem.  On Glipizide 5mg  daily and Janumet 50/500mg  BID.  Pt reports feeling good.  No CP, SOB, HA's, visual changes.  No symptomatic lows.  No numbness/tingling of hands/feet.  Pt is walking daily.  Hyperlipidemia- chronic problem, on Lipitor 40mg  daily w/o difficulty.  No abd pain, N/V.  Skin lesion- R forearm.  Not painful.  Has been present for awhile but scraped while hiking.  Pt would like removed.  Has never been to derm.   Review of Systems For ROS see HPI     Objective:   Physical Exam Vitals reviewed.  Constitutional:      General: She is not in acute distress.    Appearance: Normal appearance. She is well-developed. She is not ill-appearing.  HENT:     Head: Normocephalic and atraumatic.  Eyes:     Conjunctiva/sclera: Conjunctivae normal.     Pupils: Pupils are equal, round, and reactive to light.  Neck:     Thyroid: No thyromegaly.  Cardiovascular:     Rate and Rhythm: Normal rate and regular rhythm.     Pulses: Normal pulses.     Heart sounds: Normal heart sounds. No murmur heard. Pulmonary:     Effort: Pulmonary effort is normal. No respiratory distress.     Breath sounds: Normal breath sounds.  Abdominal:     General: There is no distension.     Palpations: Abdomen is soft.     Tenderness: There is no abdominal tenderness.  Musculoskeletal:     Cervical back: Normal range of motion and neck supple.     Right lower leg: No edema.     Left lower leg: No edema.  Lymphadenopathy:     Cervical: No cervical adenopathy.  Skin:    General: Skin is warm and dry.     Findings: Lesion (scabbed, keratotic lesion on R forearm) present.  Neurological:     General: No focal deficit present.     Mental Status: She is alert and oriented to person, place, and time.  Psychiatric:        Mood and Affect: Mood normal.        Behavior: Behavior  normal.        Thought Content: Thought content normal.           Assessment & Plan:  Skin lesion R arm- new.  Appears to be a keratotic lesions that has been scraped/irritated.  She has never been to Dermatology and given her age and fair skin, she would benefit from a complete evaluation.  Referral placed

## 2022-09-24 NOTE — Assessment & Plan Note (Signed)
Chronic problem.  Currently on Glipizide 5mg  daily and Janumet 50/500mg  BID w/o difficulty.  Reports feeling well physically.  UTD on eye exam.  Foot exam done today.  Microalbumin and A1C ordered.  Check labs.  Adjust meds prn

## 2022-09-24 NOTE — Telephone Encounter (Signed)
-----   Message from Sheliah Hatch, MD sent at 09/24/2022  2:35 PM EDT ----- Labs look great!!  No changes at this time  A1C is MUCH better than last check!  Cholesterol is better!!  Creatinine (kidney function) is stable when compared to the last year but make sure you are drinking plenty of water daily

## 2022-09-24 NOTE — Patient Instructions (Signed)
Schedule your complete physical in 6 months We'll notify you of your lab results and make any changes if needed We'll call you to schedule your Dermatology appt Keep up the good work!  You look great! Schedule your mammogram at your convenience Call with any questions or concerns Stay Safe!  Stay Healthy! Have a great summer!!

## 2022-10-14 ENCOUNTER — Other Ambulatory Visit: Payer: Self-pay | Admitting: Family Medicine

## 2022-11-10 ENCOUNTER — Ambulatory Visit (INDEPENDENT_AMBULATORY_CARE_PROVIDER_SITE_OTHER): Payer: Medicare HMO | Admitting: *Deleted

## 2022-11-10 DIAGNOSIS — Z0000 Encounter for general adult medical examination without abnormal findings: Secondary | ICD-10-CM

## 2022-11-10 DIAGNOSIS — Z1231 Encounter for screening mammogram for malignant neoplasm of breast: Secondary | ICD-10-CM

## 2022-11-10 NOTE — Patient Instructions (Addendum)
Tracy Williams , Thank you for taking time to come for your Medicare Wellness Visit. I appreciate your ongoing commitment to your health goals. Please review the following plan we discussed and let me know if I can assist you in the future.   Screening recommendations/referrals: Colonoscopy: no longer required Mammogram: ordered Bone Density: declined Recommended yearly ophthalmology/optometry visit for glaucoma screening and checkup Recommended yearly dental visit for hygiene and checkup  Vaccinations: Influenza vaccine: Education provided Pneumococcal vaccine: up to date Tdap vaccine: up to date Shingles vaccine: 1 of 2    Advanced directives: Education provided  1q   Preventive Care 65 Years and Older, Female Preventive care refers to lifestyle choices and visits with your health care provider that can promote health and wellness. What does preventive care include? A yearly physical exam. This is also called an annual well check. Dental exams once or twice a year. Routine eye exams. Ask your health care provider how often you should have your eyes checked. Personal lifestyle choices, including: Daily care of your teeth and gums. Regular physical activity. Eating a healthy diet. Avoiding tobacco and drug use. Limiting alcohol use. Practicing safe sex. Taking low-dose aspirin every day. Taking vitamin and mineral supplements as recommended by your health care provider. What happens during an annual well check? The services and screenings done by your health care provider during your annual well check will depend on your age, overall health, lifestyle risk factors, and family history of disease. Counseling  Your health care provider may ask you questions about your: Alcohol use. Tobacco use. Drug use. Emotional well-being. Home and relationship well-being. Sexual activity. Eating habits. History of falls. Memory and ability to understand (cognition). Work and work  Astronomer. Reproductive health. Screening  You may have the following tests or measurements: Height, weight, and BMI. Blood pressure. Lipid and cholesterol levels. These may be checked every 5 years, or more frequently if you are over 31 years old. Skin check. Lung cancer screening. You may have this screening every year starting at age 4 if you have a 30-pack-year history of smoking and currently smoke or have quit within the past 15 years. Fecal occult blood test (FOBT) of the stool. You may have this test every year starting at age 70. Flexible sigmoidoscopy or colonoscopy. You may have a sigmoidoscopy every 5 years or a colonoscopy every 10 years starting at age 75. Hepatitis C blood test. Hepatitis B blood test. Sexually transmitted disease (STD) testing. Diabetes screening. This is done by checking your blood sugar (glucose) after you have not eaten for a while (fasting). You may have this done every 1-3 years. Bone density scan. This is done to screen for osteoporosis. You may have this done starting at age 36. Mammogram. This may be done every 1-2 years. Talk to your health care provider about how often you should have regular mammograms. Talk with your health care provider about your test results, treatment options, and if necessary, the need for more tests. Vaccines  Your health care provider may recommend certain vaccines, such as: Influenza vaccine. This is recommended every year. Tetanus, diphtheria, and acellular pertussis (Tdap, Td) vaccine. You may need a Td booster every 10 years. Zoster vaccine. You may need this after age 32. Pneumococcal 13-valent conjugate (PCV13) vaccine. One dose is recommended after age 60. Pneumococcal polysaccharide (PPSV23) vaccine. One dose is recommended after age 74. Talk to your health care provider about which screenings and vaccines you need and how often you need them.  This information is not intended to replace advice given to you by  your health care provider. Make sure you discuss any questions you have with your health care provider. Document Released: 04/04/2015 Document Revised: 11/26/2015 Document Reviewed: 01/07/2015 Elsevier Interactive Patient Education  2017 [employer].  Fall Prevention in the Home Falls can cause injuries. They can happen to people of all ages. There are many things you can do to make your home safe and to help prevent falls. What can I do on the outside of my home? Regularly fix the edges of walkways and driveways and fix any cracks. Remove anything that might make you trip as you walk through a door, such as a raised step or threshold. Trim any bushes or trees on the path to your home. Use bright outdoor lighting. Clear any walking paths of anything that might make someone trip, such as rocks or tools. Regularly check to see if handrails are loose or broken. Make sure that both sides of any steps have handrails. Any raised decks and porches should have guardrails on the edges. Have any leaves, snow, or ice cleared regularly. Use sand or salt on walking paths during winter. Clean up any spills in your garage right away. This includes oil or grease spills. What can I do in the bathroom? Use night lights. Install grab bars by the toilet and in the tub and shower. Do not use towel bars as grab bars. Use non-skid mats or decals in the tub or shower. If you need to sit down in the shower, use a plastic, non-slip stool. Keep the floor dry. Clean up any water that spills on the floor as soon as it happens. Remove soap buildup in the tub or shower regularly. Attach bath mats securely with double-sided non-slip rug tape. Do not have throw rugs and other things on the floor that can make you trip. What can I do in the bedroom? Use night lights. Make sure that you have a light by your bed that is easy to reach. Do not use any sheets or blankets that are too big for your bed. They should not hang  down onto the floor. Have a firm chair that has side arms. You can use this for support while you get dressed. Do not have throw rugs and other things on the floor that can make you trip. What can I do in the kitchen? Clean up any spills right away. Avoid walking on wet floors. Keep items that you use a lot in easy-to-reach places. If you need to reach something above you, use a strong step stool that has a grab bar. Keep electrical cords out of the way. Do not use floor polish or wax that makes floors slippery. If you must use wax, use non-skid floor wax. Do not have throw rugs and other things on the floor that can make you trip. What can I do with my stairs? Do not leave any items on the stairs. Make sure that there are handrails on both sides of the stairs and use them. Fix handrails that are broken or loose. Make sure that handrails are as long as the stairways. Check any carpeting to make sure that it is firmly attached to the stairs. Fix any carpet that is loose or worn. Avoid having throw rugs at the top or bottom of the stairs. If you do have throw rugs, attach them to the floor with carpet tape. Make sure that you have a light switch at the  top of the stairs and the bottom of the stairs. If you do not have them, ask someone to add them for you. What else can I do to help prevent falls? Wear shoes that: Do not have high heels. Have rubber bottoms. Are comfortable and fit you well. Are closed at the toe. Do not wear sandals. If you use a stepladder: Make sure that it is fully opened. Do not climb a closed stepladder. Make sure that both sides of the stepladder are locked into place. Ask someone to hold it for you, if possible. Clearly mark and make sure that you can see: Any grab bars or handrails. First and last steps. Where the edge of each step is. Use tools that help you move around (mobility aids) if they are needed. These  include: Canes. Walkers. Scooters. Crutches. Turn on the lights when you go into a dark area. Replace any light bulbs as soon as they burn out. Set up your furniture so you have a clear path. Avoid moving your furniture around. If any of your floors are uneven, fix them. If there are any pets around you, be aware of where they are. Review your medicines with your doctor. Some medicines can make you feel dizzy. This can increase your chance of falling. Ask your doctor what other things that you can do to help prevent falls. This information is not intended to replace advice given to you by your health care provider. Make sure you discuss any questions you have with your health care provider. Document Released: 01/02/2009 Document Revised: 08/14/2015 Document Reviewed: 04/12/2014 Elsevier Interactive Patient Education  2017 [employer].

## 2022-11-10 NOTE — Progress Notes (Signed)
Subjective:   Tracy Williams is a 77 y.o. female who presents for Medicare Annual (Subsequent) preventive examination.  Visit Complete: Virtual  I connected with  Tracy Williams on 11/10/22 by a audio enabled telemedicine application and verified that I am speaking with the correct person using two identifiers.  Patient Location: Home  Provider Location: Home Office  I discussed the limitations of evaluation and management by telemedicine. The patient expressed understanding and agreed to proceed.  Vital Signs: Unable to obtain new vitals due to this being a telehealth visit.   Review of Systems     Cardiac Risk Factors include: advanced age (>9men, >36 women);diabetes mellitus     Objective:    There were no vitals filed for this visit. There is no height or weight on file to calculate BMI.     11/10/2022    1:35 PM 02/18/2022    1:33 PM 02/05/2021    2:24 PM 08/04/2020   10:23 AM 01/07/2020   12:51 PM 01/03/2019    9:33 AM 09/29/2018   11:20 AM  Advanced Directives  Does Patient Have a Medical Advance Directive? Yes Yes Yes No Yes Yes Yes  Type of Sales promotion account executive of [employer] Power of [place];Living will  Healthcare Power of [place];Living will Healthcare Power of [place];Living will Living will  Does patient want to make changes to medical advance directive?      Yes (MAU/Ambulatory/Procedural Areas - Information given) No - Patient declined  Copy of Healthcare Power of Attorney in Chart? No - copy requested No - copy requested No - copy requested  No - copy requested No - copy requested     Current Medications (verified) Outpatient Encounter Medications as of 11/10/2022  Medication Sig   ACCU-CHEK SOFTCLIX LANCETS lancets Use as instructed to test sugars 2-3 times daily. E11.9   Alcohol Swabs (B-D SINGLE USE SWABS REGULAR) PADS USE ONE SWAB EACH TIME SUGARS ARE TESTED AS DIRECTED    atorvastatin (LIPITOR) 40 MG tablet TAKE 1 TABLET BY MOUTH EVERY DAY   Blood Glucose Monitoring Suppl (ACCU-CHEK GUIDE) w/Device KIT 1 each by Does not apply route 3 (three) times daily.   Cholecalciferol (VITAMIN D3) 2000 units TABS Take 2,000 Units by mouth daily.    EPINEPHrine 0.3 mg/0.3 mL IJ SOAJ injection Inject 0.3 mLs (0.3 mg total) into the muscle as needed for anaphylaxis.   glipiZIDE (GLUCOTROL XL) 5 MG 24 hr tablet TAKE 1 TABLET BY MOUTH EVERY DAY WITH BREAKFAST   glucose blood (ACCU-CHEK GUIDE) test strip Use as instructed   sitaGLIPtin-metformin (JANUMET) 50-500 MG tablet Take 1 tablet by mouth 2 (two) times daily with a meal.   No facility-administered encounter medications on file as of 11/10/2022.    Allergies (verified) Lisinopril, Metformin and related, Darvocet [propoxyphene n-acetaminophen], Demerol [meperidine], Hydrochlorothiazide, Percocet [oxycodone-acetaminophen], and Vicodin [hydrocodone-acetaminophen]   History: Past Medical History:  Diagnosis Date   Allergy    Anemia    hx   Angio-edema    Arthritis    Diabetes mellitus without complication (HCC)    Type II   Dysrhythmia    Hx: of palpitations in 2010 only   GERD (gastroesophageal reflux disease)    Hypertension    Pancreatic mass    /notes 02/25/2016   PONV (postoperative nausea and vomiting)    pt reports no PONV after biopsy on 12/24/15   Right knee DJD    Stress incontinence    Urinary frequency  Urinary urgency    Wears glasses    Past Surgical History:  Procedure Laterality Date   CESAREAN SECTION  1975   COLONOSCOPY W/ BIOPSIES AND POLYPECTOMY     Hx: of in 2012   DILATION AND CURETTAGE OF UTERUS     Hx: of 1978   EUS N/A 12/24/2015   Procedure: UPPER ENDOSCOPIC ULTRASOUND (EUS) RADIAL;  Surgeon: Willis Modena, MD;  Location: [facility];  Service: Endoscopy;  Laterality: N/A;   FINE NEEDLE ASPIRATION N/A 12/24/2015   Procedure: FINE NEEDLE ASPIRATION (FNA) RADIAL;  Surgeon: Willis Modena, MD;  Location: [facility];  Service: Endoscopy;  Laterality: N/A;   JOINT REPLACEMENT     Hx: of left Knee 2010   LAPAROSCOPIC DISTAL PANCREATECTOMY  02/25/2016   Diagnostic laparoscopy, laparoscopic hand-assisted    LAPAROSCOPIC SPLENECTOMY N/A 02/25/2016   Procedure: LAPAROSCOPIC SPLENECTOMY;  Surgeon: Almond Lint, MD;  Location: [facility];  Service: General;  Laterality: N/A;   PANCREATECTOMY N/A 02/25/2016   Procedure: LAPAROSCOPIC DISTAL PANCREATECTOMY;  Surgeon: Almond Lint, MD;  Location: [facility] OR;  Service: General;  Laterality: N/A;   SPLENECTOMY  02/25/2016   TOTAL KNEE ARTHROPLASTY  2010   TOTAL KNEE ARTHROPLASTY Right 06/26/2012   Dr Thurston Hole   TOTAL KNEE ARTHROPLASTY Right 06/26/2012   Procedure: TOTAL KNEE ARTHROPLASTY- right;  Surgeon: Nilda Simmer, MD;  Location: [facility] OR;  Service: Orthopedics;  Laterality: Right;   Family History  Problem Relation Age of Onset   Diabetes Mellitus II Father    Hypertension Father    Cancer - Other Mother        Breast   Breast cancer Mother 34   Social History   Socioeconomic History   Marital status: Widowed    Spouse name: Not on file   Number of children: Not on file   Years of education: Not on file   Highest education level: Not on file  Occupational History   Occupation: retired  Tobacco Use   Smoking status: Never   Smokeless tobacco: Never  Vaping Use   Vaping status: Never Used  Substance and Sexual Activity   Alcohol use: No   Drug use: No   Sexual activity: Not Currently  Other Topics Concern   Not on file  Social History Narrative   Not on file   Social Determinants of Health   Financial Resource Strain: Low Risk  (11/10/2022)   Overall Financial Resource Strain (CARDIA)    Difficulty of Paying Living Expenses: Not hard at all  Food Insecurity: No Food Insecurity (11/10/2022)   Hunger Vital Sign    Worried About Running Out of Food in the Last Year: Never true    Ran Out of Food in the Last Year: Never  true  Transportation Needs: No Transportation Needs (11/10/2022)   PRAPARE - Administrator, Civil Service (Medical): No    Lack of Transportation (Non-Medical): No  Physical Activity: Sufficiently Active (11/10/2022)   Exercise Vital Sign    Days of Exercise per Week: 6 days    Minutes of Exercise per Session: 40 min  Stress: No Stress Concern Present (11/10/2022)   [employer] of Occupational Health - Occupational Stress Questionnaire    Feeling of Stress : Not at all  Social Connections: Socially Integrated (11/10/2022)   Social Connection and Isolation Panel [NHANES]    Frequency of Communication with Friends and Family: More than three times a week    Frequency of Social Gatherings with Friends and  Family: More than three times a week    Attends Religious Services: More than 4 times per year    Active Member of Clubs or Organizations: Yes    Attends Engineer, structural: More than 4 times per year    Marital Status: Married    Tobacco Counseling Counseling given: Not Answered   Clinical Intake:  Pre-visit preparation completed: Yes  Pain : No/denies pain     Diabetes: Yes CBG done?: No Did pt. bring in CBG monitor from home?: No     Interpreter Needed?: No  Information entered by :: Remi Haggard LPN   Activities of Daily Living    11/10/2022    1:36 PM 03/04/2022    2:01 PM  In your present state of health, do you have any difficulty performing the following activities:  Hearing? 0 0  Vision? 0 0  Difficulty concentrating or making decisions? 0 0  Walking or climbing stairs? 0 0  Dressing or bathing? 0 0  Doing errands, shopping? 0 0  Using the Toilet? N   In the past six months, have you accidently leaked urine? N   Do you have problems with loss of bowel control? N   Managing your Medications? N   Managing your Finances? N   Housekeeping or managing your Housekeeping? N     Patient Care Team: Sheliah Hatch, MD as PCP -  General (Family Medicine) Vida Rigger, MD as Consulting Physician (Gastroenterology) Salvatore Marvel, MD as Consulting Physician (Orthopedic Surgery) Lyn Records, MD (Inactive) as Consulting Physician (Cardiology) Elise Benne, MD as Consulting Physician (Ophthalmology) Erroll Luna, [facility] (Inactive) (Pharmacist)  Indicate any recent Medical Services you may have received from other than [facility] providers in the past year (date may be approximate).     Assessment:   This is a routine wellness examination for Tracy Williams.  Hearing/Vision screen Hearing Screening - Comments:: No trouble hearing  Vision Screening - Comments:: [facility] Up to date  Dietary issues and exercise activities discussed:     Goals Addressed             This Visit's Progress    Patient Stated       Continue current lifestyle       Depression Screen    11/10/2022    1:38 PM 09/24/2022    9:55 AM 04/28/2022   10:41 AM 03/04/2022    2:01 PM 02/18/2022    1:40 PM 01/08/2022   11:33 AM 09/01/2021   10:36 AM  PHQ 2/9 Scores  PHQ - 2 Score 0 0 0 0 0 0 0  PHQ- 9 Score  0 2 1 0 7 0    Fall Risk    11/10/2022    1:35 PM 09/24/2022    9:55 AM 04/28/2022   10:41 AM 03/04/2022    2:01 PM 02/18/2022    1:36 PM  Fall Risk   Falls in the past year? 0 0 0 0 0  Number falls in past yr: 0 0 0  0  Injury with Fall? 0 0 0  0  Risk for fall due to :  No Fall Risks No Fall Risks No Fall Risks   Follow up Falls evaluation completed;Education provided;Falls prevention discussed Falls evaluation completed Falls evaluation completed Falls evaluation completed Falls evaluation completed;Education provided;Falls prevention discussed    MEDICARE RISK AT HOME: Medicare Risk at Home Any stairs in or around the home?: Yes If so, are there any without handrails?: No Home  free of loose throw rugs in walkways, pet beds, electrical cords, etc?: Yes Adequate lighting in your home to reduce risk of falls?: Yes Life alert?:  No Use of a cane, walker or w/c?: No Grab bars in the bathroom?: Yes Shower chair or bench in shower?: Yes Elevated toilet seat or a handicapped toilet?: Yes  TIMED UP AND GO:  Was the test performed?  No    Cognitive Function:        11/10/2022    1:38 PM  6CIT Screen  What time? 0 points  Count back from 20 0 points  Months in reverse 0 points    Immunizations Immunization History  Administered Date(s) Administered   Fluad Quad(high Dose 65+) 01/03/2019, 12/31/2019   HIB (PRP-T) 01/29/2016   Influenza, High Dose Seasonal PF 12/27/2016, 01/08/2022   Meningococcal Mcv4o 01/29/2016   Pneumococcal Conjugate-13 05/08/2014   Pneumococcal Polysaccharide-23 06/27/2012   Zoster Recombinant(Shingrix) 09/08/2022, 09/08/2022, 10/06/2022    TDAP status: Up to date  Flu Vaccine status: Due, Education has been provided regarding the importance of this vaccine. Advised may receive this vaccine at local pharmacy or Health Dept. Aware to provide a copy of the vaccination record if obtained from local pharmacy or Health Dept. Verbalized acceptance and understanding.  Pneumococcal vaccine status: Up to date  Covid-19 vaccine status: Information provided on how to obtain vaccines.   Qualifies for Shingles Vaccine? Yes   Zostavax completed No   Shingrix Completed?: Yes  Screening Tests Health Maintenance  Topic Date Due   MAMMOGRAM  07/08/2022   INFLUENZA VACCINE  10/21/2022   OPHTHALMOLOGY EXAM  12/16/2022   HEMOGLOBIN A1C  03/27/2023   Diabetic kidney evaluation - eGFR measurement  09/24/2023   Diabetic kidney evaluation - Urine ACR  09/24/2023   FOOT EXAM  09/24/2023   Medicare Annual Wellness (AWV)  11/10/2023   Pneumonia Vaccine 25+ Years old  Completed   DEXA SCAN  Completed   HPV VACCINES  Aged Out   DTaP/Tdap/Td  Discontinued   Colonoscopy  Discontinued   COVID-19 Vaccine  Discontinued   Hepatitis C Screening  Discontinued   Zoster Vaccines- Shingrix  Discontinued     Health Maintenance  Health Maintenance Due  Topic Date Due   MAMMOGRAM  07/08/2022   INFLUENZA VACCINE  10/21/2022    Colorectal cancer screening: No longer required.   Mammogram status: Ordered  . Pt provided with contact info and advised to call to schedule appt.   Bone Density declined  Lung Cancer Screening: (Low Dose CT Chest recommended if Age 59-80 years, 20 pack-year currently smoking OR have quit w/in 15years.) does not qualify.   Lung Cancer Screening Referral:   Additional Screening:  Hepatitis C Screening: does not qualify; Completed 2023  Vision Screening: Recommended annual ophthalmology exams for early detection of glaucoma and other disorders of the eye. Is the patient up to date with their annual eye exam?  Yes  Who is the provider or what is the name of the office in which the patient attends annual eye exams? [facility] If pt is not established with a provider, would they like to be referred to a provider to establish care? No .   Dental Screening: Recommended annual dental exams for proper oral hygiene  Nutrition Risk Assessment:  Has the patient had any N/V/D within the last 2 months?  No  Does the patient have any non-healing wounds?  No  Has the patient had any unintentional weight loss or weight gain?  No   Diabetes:  Is the patient diabetic?  Yes  If diabetic, was a CBG obtained today?  No  Did the patient bring in their glucometer from home?  No  How often do you monitor your CBG's? Does not check often.   Financial Strains and Diabetes Management:  Are you having any financial strains with the device, your supplies or your medication? No .  Does the patient want to be seen by Chronic Care Management for management of their diabetes?  No  Would the patient like to be referred to a Nutritionist or for Diabetic Management?  No   Diabetic Exams:  Diabetic Eye Exam: Completed  Pt has been advised about the importance in completing this exam. A  referral has been placed today. Message sent to referral coordinator for scheduling purposes.   Diabetic Foot Exam: . Pt has been advised about the importance in completing this exam  Community Resource Referral / Chronic Care Management: CRR required this visit?  No   CCM required this visit?  No     Plan:     I have personally reviewed and noted the following in the patient's chart:   Medical and social history Use of alcohol, tobacco or illicit drugs  Current medications and supplements including opioid prescriptions. Patient is not currently taking opioid prescriptions. Functional ability and status Nutritional status Physical activity Advanced directives List of other physicians Hospitalizations, surgeries, and ER visits in previous 12 months Vitals Screenings to include cognitive, depression, and falls Referrals and appointments  In addition, I have reviewed and discussed with patient certain preventive protocols, quality metrics, and best practice recommendations. A written personalized care plan for preventive services as well as general preventive health recommendations were provided to patient.     Remi Haggard, LPN   0/[id number]/1191   After Visit Summary: (MyChart) Due to this being a telephonic visit, the after visit summary with patients personalized plan was offered to patient via MyChart   Nurse Notes:

## 2022-12-03 ENCOUNTER — Other Ambulatory Visit: Payer: Self-pay | Admitting: Family Medicine

## 2022-12-11 ENCOUNTER — Other Ambulatory Visit: Payer: Self-pay | Admitting: Family Medicine

## 2022-12-14 ENCOUNTER — Ambulatory Visit
Admission: RE | Admit: 2022-12-14 | Discharge: 2022-12-14 | Disposition: A | Discharge status: Home / Self Care | Payer: Medicare HMO | Source: Ambulatory Visit | Attending: Family Medicine | Admitting: Family Medicine

## 2022-12-14 DIAGNOSIS — Z1231 Encounter for screening mammogram for malignant neoplasm of breast: Secondary | ICD-10-CM | POA: Diagnosis not present

## 2022-12-23 DIAGNOSIS — H2513 Age-related nuclear cataract, bilateral: Secondary | ICD-10-CM | POA: Diagnosis not present

## 2022-12-23 DIAGNOSIS — H5203 Hypermetropia, bilateral: Secondary | ICD-10-CM | POA: Diagnosis not present

## 2022-12-23 DIAGNOSIS — E119 Type 2 diabetes mellitus without complications: Secondary | ICD-10-CM | POA: Diagnosis not present

## 2022-12-23 LAB — HM DIABETES EYE EXAM

## 2022-12-24 DIAGNOSIS — H524 Presbyopia: Secondary | ICD-10-CM | POA: Diagnosis not present

## 2023-03-23 DIAGNOSIS — J189 Pneumonia, unspecified organism: Secondary | ICD-10-CM

## 2023-03-23 HISTORY — DX: Pneumonia, unspecified organism: J18.9

## 2023-04-15 ENCOUNTER — Encounter: Payer: Medicare HMO | Admitting: Family Medicine

## 2023-04-18 ENCOUNTER — Encounter: Payer: Self-pay | Admitting: Family Medicine

## 2023-04-18 ENCOUNTER — Ambulatory Visit (INDEPENDENT_AMBULATORY_CARE_PROVIDER_SITE_OTHER)
Admission: RE | Admit: 2023-04-18 | Discharge: 2023-04-18 | Disposition: A | Discharge status: Home / Self Care | Payer: Medicare HMO | Source: Ambulatory Visit | Attending: Family Medicine | Admitting: Family Medicine

## 2023-04-18 ENCOUNTER — Ambulatory Visit (INDEPENDENT_AMBULATORY_CARE_PROVIDER_SITE_OTHER): Payer: Medicare HMO | Admitting: Family Medicine

## 2023-04-18 VITALS — BP 138/68 | HR 82 | Temp 98.8°F | Ht 65.0 in | Wt 159.1 lb

## 2023-04-18 DIAGNOSIS — Z794 Long term (current) use of insulin: Secondary | ICD-10-CM | POA: Diagnosis not present

## 2023-04-18 DIAGNOSIS — Z96652 Presence of left artificial knee joint: Secondary | ICD-10-CM | POA: Diagnosis not present

## 2023-04-18 DIAGNOSIS — E118 Type 2 diabetes mellitus with unspecified complications: Secondary | ICD-10-CM | POA: Diagnosis not present

## 2023-04-18 DIAGNOSIS — W109XXA Fall (on) (from) unspecified stairs and steps, initial encounter: Secondary | ICD-10-CM

## 2023-04-18 DIAGNOSIS — M25562 Pain in left knee: Secondary | ICD-10-CM | POA: Diagnosis not present

## 2023-04-18 DIAGNOSIS — Z471 Aftercare following joint replacement surgery: Secondary | ICD-10-CM | POA: Diagnosis not present

## 2023-04-18 DIAGNOSIS — M25462 Effusion, left knee: Secondary | ICD-10-CM | POA: Diagnosis not present

## 2023-04-18 LAB — HEPATIC FUNCTION PANEL
ALT: 11 U/L (ref 0–35)
AST: 15 U/L (ref 0–37)
Albumin: 4.4 g/dL (ref 3.5–5.2)
Alkaline Phosphatase: 93 U/L (ref 39–117)
Bilirubin, Direct: 0.1 mg/dL (ref 0.0–0.3)
Total Bilirubin: 0.8 mg/dL (ref 0.2–1.2)
Total Protein: 7.3 g/dL (ref 6.0–8.3)

## 2023-04-18 LAB — CBC WITH DIFFERENTIAL/PLATELET
Basophils Absolute: 0.1 10*3/uL (ref 0.0–0.1)
Basophils Relative: 0.6 % (ref 0.0–3.0)
Eosinophils Absolute: 0.3 10*3/uL (ref 0.0–0.7)
Eosinophils Relative: 2.3 % (ref 0.0–5.0)
HCT: 38 % (ref 36.0–46.0)
Hemoglobin: 12.4 g/dL (ref 12.0–15.0)
Lymphocytes Relative: 16.7 % (ref 12.0–46.0)
Lymphs Abs: 2 10*3/uL (ref 0.7–4.0)
MCHC: 32.6 g/dL (ref 30.0–36.0)
MCV: 90.3 fL (ref 78.0–100.0)
Monocytes Absolute: 1.2 10*3/uL — ABNORMAL HIGH (ref 0.1–1.0)
Monocytes Relative: 9.8 % (ref 3.0–12.0)
Neutro Abs: 8.3 10*3/uL — ABNORMAL HIGH (ref 1.4–7.7)
Neutrophils Relative %: 70.6 % (ref 43.0–77.0)
Platelets: 391 10*3/uL (ref 150.0–400.0)
RBC: 4.2 Mil/uL (ref 3.87–5.11)
RDW: 13.6 % (ref 11.5–15.5)
WBC: 11.8 10*3/uL — ABNORMAL HIGH (ref 4.0–10.5)

## 2023-04-18 LAB — BASIC METABOLIC PANEL
BUN: 28 mg/dL — ABNORMAL HIGH (ref 6–23)
CO2: 28 meq/L (ref 19–32)
Calcium: 9.6 mg/dL (ref 8.4–10.5)
Chloride: 102 meq/L (ref 96–112)
Creatinine, Ser: 1.27 mg/dL — ABNORMAL HIGH (ref 0.40–1.20)
GFR: 40.83 mL/min — ABNORMAL LOW (ref >60.00)
Glucose, Bld: 99 mg/dL (ref 70–99)
Potassium: 4.6 meq/L (ref 3.5–5.1)
Sodium: 139 meq/L (ref 135–145)

## 2023-04-18 LAB — LIPID PANEL
Cholesterol: 183 mg/dL (ref 0–200)
HDL: 39 mg/dL — ABNORMAL LOW (ref >39.00)
LDL Cholesterol: 103 mg/dL — ABNORMAL HIGH (ref 0–99)
NonHDL: 144.2
Total CHOL/HDL Ratio: 5
Triglycerides: 208 mg/dL — ABNORMAL HIGH (ref 0.0–149.0)
VLDL: 41.6 mg/dL — ABNORMAL HIGH (ref 0.0–40.0)

## 2023-04-18 LAB — HEMOGLOBIN A1C: Hgb A1c MFr Bld: 7.3 % — ABNORMAL HIGH (ref 4.6–6.5)

## 2023-04-18 LAB — TSH: TSH: 1.11 u[IU]/mL (ref 0.35–5.50)

## 2023-04-18 NOTE — Patient Instructions (Signed)
Schedule your complete physical in 3-4 months We'll notify you of your lab results and make any changes if needed Go to [address] and get your xray done today Continue tylenol for pain ICE!!! Call with any questions or concerns Hang in there!!!

## 2023-04-18 NOTE — Progress Notes (Unsigned)
   Subjective:    Patient ID: Tracy Williams, female    DOB: [birth year], 78 y.o.   MRN: [id number]  HPI Fall- fell on 1/21 after slipping on ice on her step.  Landed on R side but developed immediate L knee pain.  + swelling on both sides.  S/p knee replacement.  Pt reports pain is a dull ache but if she moves in certain ways, it is so sharp and severe it causes her to scream out.  Some relief w/ Tylenol.    DM- chronic problem, last A1C 7.2% on Glipizide 5mg  daily and Janumet 50/500mg  BID.  UTD on eye exam (will get records), microalbumin, foot exam.   Review of Systems For ROS see HPI     Objective:   Physical Exam Vitals reviewed.  Constitutional:      General: She is not in acute distress.    Appearance: Normal appearance. She is well-developed. She is not ill-appearing.  HENT:     Head: Normocephalic and atraumatic.  Eyes:     Conjunctiva/sclera: Conjunctivae normal.     Pupils: Pupils are equal, round, and reactive to light.  Neck:     Thyroid: No thyromegaly.  Cardiovascular:     Rate and Rhythm: Normal rate and regular rhythm.     Heart sounds: Normal heart sounds. No murmur heard. Pulmonary:     Effort: Pulmonary effort is normal. No respiratory distress.     Breath sounds: Normal breath sounds.  Abdominal:     General: There is no distension.     Palpations: Abdomen is soft.     Tenderness: There is no abdominal tenderness.  Musculoskeletal:        General: Swelling (L medial knee) present. No deformity.     Cervical back: Normal range of motion and neck supple.     Comments: Full flexion of L knee but pain w/ extension  Lymphadenopathy:     Cervical: No cervical adenopathy.  Skin:    General: Skin is warm and dry.  Neurological:     Mental Status: She is alert and oriented to person, place, and time.  Psychiatric:        Behavior: Behavior normal.           Assessment & Plan:  Fall from step w/ acute L knee pain- new.  Fall occurred 1/21 on an  icy step.  She had immediate pain in her L knee even though she did not land on that side.  Feels that she twisted it.  Knee is mildly swollen today and she has pain w/ extension.  S/p TKR.  Will get xrays to ensure implants are intact and in proper position.  Encouraged ice and tylenol.  If still causing pain early next week, will refer to Ortho.  Pt expressed understanding and is in agreement w/ plan.

## 2023-04-19 ENCOUNTER — Telehealth: Payer: Self-pay

## 2023-04-19 ENCOUNTER — Encounter: Payer: Self-pay | Admitting: Family Medicine

## 2023-04-19 NOTE — Telephone Encounter (Signed)
-----   Message from Neena Rhymes sent at 04/18/2023  7:59 PM EST ----- Labs are stable and overall look good!  Kidney function is stable, cholesterol is stable, white blood cell count and A1C are stable  No changes at this time.  Keep up the good work!

## 2023-04-19 NOTE — Assessment & Plan Note (Signed)
Chronic problem.  On Glipizide and Janumet.  UTD on eye exam, foot exam, microalbumin.  Currently asymptomatic.  Check labs.  Adjust meds prn

## 2023-04-19 NOTE — Telephone Encounter (Signed)
Pt has been notified.

## 2023-05-10 ENCOUNTER — Encounter: Payer: Self-pay | Admitting: Family Medicine

## 2023-05-13 ENCOUNTER — Encounter: Payer: Self-pay | Admitting: Family Medicine

## 2023-06-15 ENCOUNTER — Other Ambulatory Visit: Payer: Self-pay | Admitting: Family Medicine

## 2023-06-17 ENCOUNTER — Other Ambulatory Visit: Payer: Self-pay | Admitting: Family Medicine

## 2023-07-04 ENCOUNTER — Telehealth: Payer: Self-pay

## 2023-07-04 NOTE — Telephone Encounter (Signed)
 Appointment has been made for the patient considering cannot provide medication without a visit

## 2023-07-04 NOTE — Telephone Encounter (Signed)
 Copied from CRM [id number]. Topic: Clinical - Medication Question >> Jul 04, 2023  8:51 AM Ovid Blow wrote: Reason for CRM: patient has sore throat, stuffy nose and cough. Patient is asking Dr. Paulla Bossier to send in some medication for her symptoms. Please contact patient about this matter

## 2023-07-05 ENCOUNTER — Ambulatory Visit (HOSPITAL_BASED_OUTPATIENT_CLINIC_OR_DEPARTMENT_OTHER)
Admission: RE | Admit: 2023-07-05 | Discharge: 2023-07-05 | Disposition: A | Discharge status: Home / Self Care | Source: Ambulatory Visit | Attending: Student in an Organized Health Care Education/Training Program | Admitting: Student in an Organized Health Care Education/Training Program

## 2023-07-05 ENCOUNTER — Encounter: Payer: Self-pay | Admitting: Student in an Organized Health Care Education/Training Program

## 2023-07-05 ENCOUNTER — Ambulatory Visit (INDEPENDENT_AMBULATORY_CARE_PROVIDER_SITE_OTHER): Admitting: Student in an Organized Health Care Education/Training Program

## 2023-07-05 VITALS — BP 122/60 | HR 76 | Temp 98.0°F | Wt 153.0 lb

## 2023-07-05 DIAGNOSIS — J984 Other disorders of lung: Secondary | ICD-10-CM | POA: Diagnosis not present

## 2023-07-05 DIAGNOSIS — J189 Pneumonia, unspecified organism: Secondary | ICD-10-CM | POA: Diagnosis not present

## 2023-07-05 DIAGNOSIS — R918 Other nonspecific abnormal finding of lung field: Secondary | ICD-10-CM | POA: Diagnosis not present

## 2023-07-05 DIAGNOSIS — R911 Solitary pulmonary nodule: Secondary | ICD-10-CM | POA: Diagnosis not present

## 2023-07-05 NOTE — Progress Notes (Signed)
   Acute Office Visit  Subjective:     Patient ID: Tracy Williams, female    DOB: 1946/03/04, 78 y.o.   MRN: [id number]  Chief Complaint  Patient presents with   Sore Throat    Sore throat started last Tuesday night. Congestion,coughing and headache. Has been taking dayquil and nightquil, NO fevers     HPI  Patient is in today for worsening cough, sore throat, and congestion over the last 7 days.  She denies fevers but is reporting body aches and chills.  She reports no energy, and decreased appetite.  Feels like this is a cold that she just cannot shake.  A cough is productive of white sputum.  No history of underlying lung disease.  No recent sick contacts.  No chest pain or dyspnea with exertion.  She lives by herself independently.  No recent changes in her health or her medications.      Objective:    BP 122/60   Pulse 76   Temp 98 F (36.7 C) (Temporal)   Wt 153 lb (69.4 kg)   SpO2 97%   BMI 25.46 kg/m    Physical Exam  Gen: Tired appearing Ears: Normal bilateral tympanic membranes with no effusions Face: No sinus tenderness on palpation Mouth: Erythematous posterior oropharynx without exudate Neck: No adenopathy Heart: Regular, no murmur Lungs: Unlabored, mild fine crackles heard at the right midlung field, clear on the left side, no wheezing     Assessment & Plan:   Problem List Items Addressed This Visit       Unprioritized   Community acquired pneumonia - Primary   Clinical course and exam seems most consistent with a community-acquired pneumonia.  I think this likely started as a viral respiratory tract infection.  Now she is having increased productive cough, body aches, poor appetite, and has focal crackles on the right side.  She is at risk for pneumonia due to advanced age and diabetes.  Will get chest x-ray today and if there is an opacity we will treat with antibiotics.      Relevant Orders   DG Chest 2 View    Ether Hercules,  MD

## 2023-07-05 NOTE — Assessment & Plan Note (Addendum)
 Clinical course and exam seems most consistent with a community-acquired pneumonia.  I think this likely started as a viral respiratory tract infection.  Now she is having increased productive cough, body aches, poor appetite, and has focal crackles on the right side.  She is at risk for pneumonia due to advanced age and diabetes.  Will get chest x-ray today and if there is an opacity we will treat with antibiotics.  ADDENDUM: I reviewed the x-ray images personally and spoke with the patient by phone.  On my view I do not see any opacities.  This makes pneumonia less likely, we talked about supportive care for a viral respiratory tract infection/bronchitis.  Patient to follow-up with me in a couple days if not better.

## 2023-07-18 ENCOUNTER — Telehealth: Payer: Self-pay

## 2023-07-18 ENCOUNTER — Telehealth: Payer: Self-pay | Admitting: Student in an Organized Health Care Education/Training Program

## 2023-07-18 DIAGNOSIS — R911 Solitary pulmonary nodule: Secondary | ICD-10-CM

## 2023-07-18 NOTE — Telephone Encounter (Signed)
 Copied from CRM [id number]. Topic: General - Other >> Jul 18, 2023  9:13 AM Allyne Areola wrote: Reason for CRM: [facility] is calling in regards to a report they like to disclosed to the office, Best call back number is [phone] .

## 2023-07-18 NOTE — Telephone Encounter (Signed)
 I spoke with the patient by phone.  2 weeks ago we got a chest x-ray to rule out pneumonia.  Incidentally noted a 9 mm pulmonary nodule in the right upper lobe.  I spoke with the patient and she is feeling much better.  She has recovered well from the upper respiratory infection which was most likely viral.  We talked about the incidental pulmonary nodule finding.  She has no tobacco use history, but is anxious because her husband had lung cancer.  We decided to further investigate the nodule with a CT of the chest which I have ordered.

## 2023-07-19 NOTE — Telephone Encounter (Signed)
 This was in regard to 9 mm nodule Dr Ester Helms has already ordered follow up

## 2023-08-01 ENCOUNTER — Ambulatory Visit (INDEPENDENT_AMBULATORY_CARE_PROVIDER_SITE_OTHER): Payer: Medicare HMO | Admitting: Family Medicine

## 2023-08-01 ENCOUNTER — Encounter: Payer: Self-pay | Admitting: Family Medicine

## 2023-08-01 VITALS — BP 128/62 | HR 64 | Temp 98.0°F | Ht 65.0 in | Wt 157.1 lb

## 2023-08-01 DIAGNOSIS — M810 Age-related osteoporosis without current pathological fracture: Secondary | ICD-10-CM

## 2023-08-01 DIAGNOSIS — E1329 Other specified diabetes mellitus with other diabetic kidney complication: Secondary | ICD-10-CM | POA: Diagnosis not present

## 2023-08-01 DIAGNOSIS — Z0000 Encounter for general adult medical examination without abnormal findings: Secondary | ICD-10-CM | POA: Diagnosis not present

## 2023-08-01 DIAGNOSIS — Z1159 Encounter for screening for other viral diseases: Secondary | ICD-10-CM | POA: Diagnosis not present

## 2023-08-01 DIAGNOSIS — R911 Solitary pulmonary nodule: Secondary | ICD-10-CM

## 2023-08-01 NOTE — Progress Notes (Signed)
 Subjective:    Patient ID: Tracy Williams, female    DOB: Apr 09, 1945, 78 y.o.   MRN: [id number]  HPI CPE- UTD on microalbumin, foot exam, eye exam, mammo, PNA.  Done w/ colon cancer screening.  Not interested in bone density test at this time.  Patient Care Team    Relationship Specialty Notifications Start End  Jess Morita, MD PCP - General Family Medicine  10/06/11    Comment: Donah Frock, Sims Duck, MD Consulting Physician Gastroenterology  04/14/15   Elly Habermann, MD Consulting Physician Orthopedic Surgery  08/12/16   Arty Binning, MD (Inactive) Consulting Physician Cardiology  08/12/16   Zula Hitch, MD Consulting Physician Ophthalmology  01/07/20   Myrle Aspen, [facility] (Inactive)  Pharmacist  12/16/21    Comment: [phone]    Health Maintenance  Topic Date Due   Hepatitis C Screening  Never done   DTaP/Tdap/Td (1 - Tdap) Never done   COVID-19 Vaccine (1 - 2024-25 season) Never done   Diabetic kidney evaluation - Urine ACR  09/24/2023   FOOT EXAM  09/24/2023   HEMOGLOBIN A1C  10/16/2023   INFLUENZA VACCINE  10/21/2023   Medicare Annual Wellness (AWV)  11/10/2023   MAMMOGRAM  12/14/2023   OPHTHALMOLOGY EXAM  12/23/2023   Diabetic kidney evaluation - eGFR measurement  04/17/2024   Pneumonia Vaccine 68+ Years old  Completed   DEXA SCAN  Completed   HPV VACCINES  Aged Out   Meningococcal B Vaccine  Aged Out   Colonoscopy  Discontinued   Zoster Vaccines- Shingrix  Discontinued      Review of Systems Patient reports no vision/ hearing changes, adenopathy,fever, weight change,  persistant/recurrent hoarseness , swallowing issues, chest pain, palpitations, edema, persistant/recurrent cough, hemoptysis, dyspnea (rest/exertional/paroxysmal nocturnal), gastrointestinal bleeding (melena, rectal bleeding), abdominal pain, significant heartburn, bowel changes, GU symptoms (dysuria, hematuria, incontinence), Gyn symptoms (abnormal  bleeding, pain),  syncope,  focal weakness, memory loss, numbness & tingling, skin/hair/nail changes, abnormal bruising or bleeding, anxiety, or depression.     Objective:   Physical Exam General Appearance:    Alert, cooperative, no distress, appears stated age  Head:    Normocephalic, without obvious abnormality, atraumatic  Eyes:    PERRL, conjunctiva/corneas clear, EOM's intact, fundi    benign, both eyes  Ears:    Normal TM's and external ear canals, both ears  Nose:   Nares normal, septum midline, mucosa normal, no drainage    or sinus tenderness  Throat:   Lips, mucosa, and tongue normal; teeth and gums normal  Neck:   Supple, symmetrical, trachea midline, no adenopathy;    Thyroid : no enlargement/tenderness/nodules  Back:     Symmetric, no curvature, ROM normal, no CVA tenderness  Lungs:     Clear to auscultation bilaterally, respirations unlabored  Chest Wall:    No tenderness or deformity   Heart:    Regular rate and rhythm, S1 and S2 normal, no murmur, rub   or gallop  Breast Exam:    Deferred to GYN  Abdomen:     Soft, non-tender, bowel sounds active all four quadrants,    no masses, no organomegaly  Genitalia:    Deferred to GYN  Rectal:    Extremities:   Extremities normal, atraumatic, no cyanosis or edema  Pulses:   2+ and symmetric all extremities  Skin:   Skin color, texture, turgor normal, no rashes or lesions  Lymph nodes:   Cervical, supraclavicular, and axillary nodes normal  Neurologic:  CNII-XII intact, normal strength, sensation and reflexes    throughout          Assessment & Plan:

## 2023-08-01 NOTE — Assessment & Plan Note (Signed)
 Pt's PE WNL.  UTD on mammo, eye exam, foot exam, microalbumin, PNA vaccine.  No longer doing colon cancer screening and declines DEXA at this time.  Check labs.  Anticipatory guidance provided.

## 2023-08-01 NOTE — Patient Instructions (Signed)
 Follow up in 4 months to recheck sugar We'll notify you of your lab results and make any changes if needed Keep up the good work on healthy diet and regular exercise- you look great! We'll remind each other of the CT scan next time Call with any questions or concerns Stay Safe!  Stay Healthy! Happy Belated Mother's Day!!!

## 2023-08-01 NOTE — Assessment & Plan Note (Signed)
 Declines DEXA.  Check Vit D level

## 2023-08-02 LAB — CBC WITH DIFFERENTIAL/PLATELET
Basophils Absolute: 0.1 10*3/uL (ref 0.0–0.1)
Basophils Relative: 0.8 % (ref 0.0–3.0)
Eosinophils Absolute: 0.4 10*3/uL (ref 0.0–0.7)
Eosinophils Relative: 3.9 % (ref 0.0–5.0)
HCT: 39.5 % (ref 36.0–46.0)
Hemoglobin: 12.8 g/dL (ref 12.0–15.0)
Lymphocytes Relative: 20.1 % (ref 12.0–46.0)
Lymphs Abs: 2.3 10*3/uL (ref 0.7–4.0)
MCHC: 32.5 g/dL (ref 30.0–36.0)
MCV: 90.8 fl (ref 78.0–100.0)
Monocytes Absolute: 1.2 10*3/uL — ABNORMAL HIGH (ref 0.1–1.0)
Monocytes Relative: 10.8 % (ref 3.0–12.0)
Neutro Abs: 7.4 10*3/uL (ref 1.4–7.7)
Neutrophils Relative %: 64.4 % (ref 43.0–77.0)
Platelets: 350 10*3/uL (ref 150.0–400.0)
RBC: 4.35 Mil/uL (ref 3.87–5.11)
RDW: 13.8 % (ref 11.5–15.5)
WBC: 11.5 10*3/uL — ABNORMAL HIGH (ref 4.0–10.5)

## 2023-08-02 LAB — HEPATIC FUNCTION PANEL
ALT: 11 U/L (ref 0–35)
AST: 14 U/L (ref 0–37)
Albumin: 4.2 g/dL (ref 3.5–5.2)
Alkaline Phosphatase: 100 U/L (ref 39–117)
Bilirubin, Direct: 0.1 mg/dL (ref 0.0–0.3)
Total Bilirubin: 0.7 mg/dL (ref 0.2–1.2)
Total Protein: 7.1 g/dL (ref 6.0–8.3)

## 2023-08-02 LAB — LIPID PANEL
Cholesterol: 181 mg/dL (ref 0–200)
HDL: 39.8 mg/dL (ref >39.00)
LDL Cholesterol: 98 mg/dL (ref 0–99)
NonHDL: 141.29
Total CHOL/HDL Ratio: 5
Triglycerides: 214 mg/dL — ABNORMAL HIGH (ref 0.0–149.0)
VLDL: 42.8 mg/dL — ABNORMAL HIGH (ref 0.0–40.0)

## 2023-08-02 LAB — HEMOGLOBIN A1C: Hgb A1c MFr Bld: 7 % — ABNORMAL HIGH (ref 4.6–6.5)

## 2023-08-02 LAB — VITAMIN D 25 HYDROXY (VIT D DEFICIENCY, FRACTURES): VITD: 86.3 ng/mL (ref 30.00–100.00)

## 2023-08-02 LAB — BASIC METABOLIC PANEL WITH GFR
BUN: 26 mg/dL — ABNORMAL HIGH (ref 6–23)
CO2: 29 meq/L (ref 19–32)
Calcium: 9.3 mg/dL (ref 8.4–10.5)
Chloride: 102 meq/L (ref 96–112)
Creatinine, Ser: 1.23 mg/dL — ABNORMAL HIGH (ref 0.40–1.20)
GFR: 42.34 mL/min — ABNORMAL LOW (ref >60.00)
Glucose, Bld: 115 mg/dL — ABNORMAL HIGH (ref 70–99)
Potassium: 5 meq/L (ref 3.5–5.1)
Sodium: 139 meq/L (ref 135–145)

## 2023-08-02 LAB — MICROALBUMIN / CREATININE URINE RATIO
Creatinine,U: 140.3 mg/dL
Microalb Creat Ratio: UNDETERMINED mg/g (ref 0.0–30.0)
Microalb, Ur: 0.7 mg/dL (ref 0.0–1.9)

## 2023-08-02 LAB — HEPATITIS C ANTIBODY: Hepatitis C Ab: NONREACTIVE

## 2023-08-02 LAB — TSH: TSH: 0.98 u[IU]/mL (ref 0.35–5.50)

## 2023-08-03 ENCOUNTER — Ambulatory Visit: Payer: Self-pay | Admitting: Family Medicine

## 2023-08-03 DIAGNOSIS — R9389 Abnormal findings on diagnostic imaging of other specified body structures: Secondary | ICD-10-CM

## 2023-08-03 NOTE — Telephone Encounter (Signed)
-----   Message from Laymon Priest sent at 08/03/2023  7:28 AM EDT ----- Labs are stable and look good!!  No changes at this time

## 2023-08-03 NOTE — Telephone Encounter (Signed)
 Pt has reviewed labs via MyChart

## 2023-08-27 ENCOUNTER — Other Ambulatory Visit: Payer: Self-pay | Admitting: Family Medicine

## 2023-09-08 ENCOUNTER — Ambulatory Visit: Payer: Medicare HMO | Admitting: Dermatology

## 2023-09-08 ENCOUNTER — Encounter: Payer: Self-pay | Admitting: Dermatology

## 2023-09-08 VITALS — BP 112/75 | HR 73

## 2023-09-08 DIAGNOSIS — L820 Inflamed seborrheic keratosis: Secondary | ICD-10-CM | POA: Diagnosis not present

## 2023-09-08 NOTE — Progress Notes (Unsigned)
   New Patient Visit   Subjective  Tracy Williams is a 78 y.o. female who presents for the following: Spot   Patient states she has spot located at the arms that she would like to have examined. Patient reports the areas have been there for several years. She reports the areas are not bothersome. Patient reports she has not previously been treated for these areas. Patient denies Hx of bx. Patient denies family history of skin cancer(s).  The patient has spots, moles and lesions to be evaluated, some may be new or changing and the patient may have concern these could be cancer.  The following portions of the chart were reviewed this encounter and updated as appropriate: medications, allergies, medical history  Review of Systems:  No other skin or systemic complaints except as noted in HPI or Assessment and Plan.  Objective  Well appearing patient in no apparent distress; mood and affect are within normal limits.  A focused examination was performed of the following areas: Right Posterior Arm  Relevant exam findings are noted in the Assessment and Plan.            Assessment & Plan   INFLAMED SEBORRHEIC KERATOSIS Exam: Erythematous keratotic or waxy stuck-on papule or plaque.  Symptomatic, irritating, patient would like treated.  Benign-appearing.  Call clinic for new or changing lesions.   Prior to procedure, discussed risks of blister formation, small wound, skin dyspigmentation, or rare scar following treatment. Recommend Vaseline ointment to treated areas while healing.    INFLAMED SEBORRHEIC KERATOSIS Right Forearm - Posterior Destruction of lesion - Right Forearm - Posterior Complexity: simple   Destruction method: cryotherapy   Informed consent: discussed and consent obtained   Timeout:  patient name, date of birth, surgical site, and procedure verified Lesion destroyed using liquid nitrogen: Yes   Post-procedure details: wound care instructions given    No  follow-ups on file.  LILLETTE Darice Smock, CMA, am acting as scribe for [employer], DO.   Documentation: I have reviewed the above documentation for accuracy and completeness, and I agree with the above.  Delon Lenis, DO

## 2023-09-08 NOTE — Patient Instructions (Addendum)

## 2023-09-14 ENCOUNTER — Ambulatory Visit (HOSPITAL_COMMUNITY)
Admission: RE | Admit: 2023-09-14 | Discharge: 2023-09-14 | Disposition: A | Discharge status: Home / Self Care | Source: Ambulatory Visit | Attending: Family Medicine | Admitting: Family Medicine

## 2023-09-14 DIAGNOSIS — R911 Solitary pulmonary nodule: Secondary | ICD-10-CM | POA: Diagnosis not present

## 2023-09-14 DIAGNOSIS — K449 Diaphragmatic hernia without obstruction or gangrene: Secondary | ICD-10-CM | POA: Diagnosis not present

## 2023-09-14 DIAGNOSIS — R918 Other nonspecific abnormal finding of lung field: Secondary | ICD-10-CM | POA: Diagnosis not present

## 2023-09-14 DIAGNOSIS — I700 Atherosclerosis of aorta: Secondary | ICD-10-CM | POA: Diagnosis not present

## 2023-09-26 NOTE — Addendum Note (Signed)
 Addended by: RANDINE BERYL SAUNDERS on: 09/26/2023 08:44 AM   Modules accepted: Orders

## 2023-11-22 ENCOUNTER — Encounter: Payer: Self-pay | Admitting: Dermatology

## 2023-11-22 ENCOUNTER — Ambulatory Visit: Admitting: Dermatology

## 2023-11-22 VITALS — BP 157/74

## 2023-11-22 DIAGNOSIS — L720 Epidermal cyst: Secondary | ICD-10-CM

## 2023-11-22 DIAGNOSIS — L814 Other melanin hyperpigmentation: Secondary | ICD-10-CM | POA: Diagnosis not present

## 2023-11-22 DIAGNOSIS — L570 Actinic keratosis: Secondary | ICD-10-CM | POA: Diagnosis not present

## 2023-11-22 DIAGNOSIS — L821 Other seborrheic keratosis: Secondary | ICD-10-CM

## 2023-11-22 DIAGNOSIS — D1801 Hemangioma of skin and subcutaneous tissue: Secondary | ICD-10-CM

## 2023-11-22 DIAGNOSIS — W908XXA Exposure to other nonionizing radiation, initial encounter: Secondary | ICD-10-CM

## 2023-11-22 DIAGNOSIS — Z1283 Encounter for screening for malignant neoplasm of skin: Secondary | ICD-10-CM | POA: Diagnosis not present

## 2023-11-22 DIAGNOSIS — D229 Melanocytic nevi, unspecified: Secondary | ICD-10-CM

## 2023-11-22 DIAGNOSIS — L578 Other skin changes due to chronic exposure to nonionizing radiation: Secondary | ICD-10-CM | POA: Diagnosis not present

## 2023-11-22 NOTE — Progress Notes (Signed)
   Total Body Skin Exam (TBSE) Visit   Subjective  Tracy Williams is a 78 y.o. female ESTABLISHED PATIENT who presents for the following:  Total Body Skin Exam (TBSE)  Patient was last evaluated on for ISK on 09/08/23.  Patient does not have spots of concern to be evaluated. She does apply sunscreen and/or wears protective coverings. Denied Hx of Bx. Reports family Hx of skin cancers (maternal aunt - unsure of type).   The patient has spots, moles and lesions to be evaluated, some may be new or changing and the patient has concerns that these could be cancer.  The following portions of the chart were reviewed this encounter and updated as appropriate: medications, allergies, medical history  Review of Systems:  No other skin or systemic complaints except as noted in HPI or Assessment and Plan.  Objective  Well appearing patient in no apparent distress; mood and affect are within normal limits.  A full examination was performed including scalp, head, eyes, ears, nose, lips, neck, chest, axillae, abdomen, back, buttocks, bilateral upper extremities, bilateral lower extremities, hands, feet, fingers, toes, fingernails, and toenails. All findings within normal limits unless otherwise noted below.   Relevant physical exam findings are noted in the Assessment and Plan.  Neck - Posterior Erythematous thin papules/macules with gritty scale.   Assessment & Plan   LENTIGINES, SEBORRHEIC KERATOSES, HEMANGIOMAS - Benign normal skin lesions - Benign-appearing - Call for any changes  BENIGN MELANOCYTIC NEVI - Tan-brown and/or pink-flesh-colored symmetric macules and papules - Benign appearing on exam today - Observation - Call clinic for new or changing moles - Recommend daily use of broad spectrum spf 30+ sunscreen to sun-exposed areas.   MILD ACTINIC DAMAGE - Chronic condition, secondary to cumulative UV/sun exposure - diffuse scaly erythematous macules with underlying  dyspigmentation - Recommend daily broad spectrum sunscreen SPF 30+ to sun-exposed areas, reapply every 2 hours as needed.  - Staying in the shade or wearing long sleeves, sun glasses (UVA+UVB protection) and wide brim hats (4-inch brim around the entire circumference of the hat) are also recommended for sun protection.  - Call for new or changing lesions.  Milia - tiny firm white papules - type of cyst - benign - sometimes these will clear with nightly OTC adapalene/Differin 0.1% gel or retinol. - may be extracted if symptomatic - observe   SKIN CANCER SCREENING PERFORMED TODAY.   AK (ACTINIC KERATOSIS) Neck - Posterior Destruction of lesion - Neck - Posterior Complexity: simple   Destruction method: cryotherapy   Informed consent: discussed and consent obtained   Timeout:  patient name, date of birth, surgical site, and procedure verified Lesion destroyed using liquid nitrogen: Yes   Region frozen until ice ball extended beyond lesion: Yes   Outcome: patient tolerated procedure well with no complications   Post-procedure details: wound care instructions given    Return in about 1 year (around 11/21/2024) for TBSE.   Documentation: I have reviewed the above documentation for accuracy and completeness, and I agree with the above.  I, Shamia Uppal Maranda, CMA, am acting as scribe for [employer], DO.   Delon Lenis, DO

## 2023-11-22 NOTE — Patient Instructions (Addendum)

## 2023-11-29 ENCOUNTER — Encounter: Payer: Self-pay | Admitting: Dermatology

## 2023-11-30 ENCOUNTER — Ambulatory Visit: Admitting: Pulmonary Disease

## 2023-11-30 ENCOUNTER — Encounter: Payer: Self-pay | Admitting: Pulmonary Disease

## 2023-11-30 VITALS — BP 130/60 | HR 118 | Ht 65.0 in | Wt 158.0 lb

## 2023-11-30 DIAGNOSIS — R911 Solitary pulmonary nodule: Secondary | ICD-10-CM

## 2023-11-30 NOTE — Patient Instructions (Signed)
 It is nice to meet you  I recommend a repeat CT scan to keep an eye on the small nodule seen on the chest x-ray in April and then on the CT scan in June  Based on the results we can decide next steps  Return to clinic as needed based on results of CT scan - I will be in touch regarding results

## 2023-11-30 NOTE — Progress Notes (Signed)
 @Patient  ID: Tracy Williams, female    DOB: 07-18-1945, 78 y.o.   MRN: [id number]  Chief Complaint  Patient presents with   Consult    Pt states abnormal scan     Referring provider: Mahlon Comer BRAVO, MD  HPI:   78 y.o. woman whom are seen for evaluation of lung nodule.  Multiple PCP notes reviewed.  Patient was in normal state of health.  Presented to PCP office 06/2023 with cough body aches etc.  Concern for community-acquired pneumonia versus viral infection.  Chest x-ray revealed a 8 to 9 mm right sided nodule.  This is followed up by CT scan 08/2023.  Read 09/2023.  This has a peripheral right upper lobe nodule with a central lucency or clearing unclear if cavitating versus dilated airway in that nodule on my review and interpretation.  She improved clinically.  Is here for follow-up of that CT scan.  No shortness of breath.  No cough.  Never smoker.  Mother had breast cancer metastasized to the lungs.  Husband had lung cancer and passed from this many years ago.    Questionaires / Pulmonary Flowsheets:   ACT:      No data to display          MMRC:     No data to display          Epworth:      No data to display          Tests:   FENO:  No results found for: NITRICOXIDE  PFT:     No data to display          WALK:      No data to display          Imaging: Personally viewed and as per EMR and discussion this note No results found.  Lab Results: Personally reviewed CBC    Component Value Date/Time   WBC 11.5 (H) 08/01/2023 1442   RBC 4.35 08/01/2023 1442   HGB 12.8 08/01/2023 1442   HGB 12.9 12/04/2018 0955   HCT 39.5 08/01/2023 1442   HCT 39.8 12/04/2018 0955   PLT 350.0 08/01/2023 1442   PLT 381 12/04/2018 0955   MCV 90.8 08/01/2023 1442   MCV 91 12/04/2018 0955   MCH 29.4 12/04/2018 0955   MCH 29.1 10/13/2018 2200   MCHC 32.5 08/01/2023 1442   RDW 13.8 08/01/2023 1442   RDW 13.5 12/04/2018 0955   LYMPHSABS 2.3  08/01/2023 1442   LYMPHSABS 1.5 12/04/2018 0955   MONOABS 1.2 (H) 08/01/2023 1442   EOSABS 0.4 08/01/2023 1442   EOSABS 0.2 12/04/2018 0955   BASOSABS 0.1 08/01/2023 1442   BASOSABS 0.1 12/04/2018 0955    BMET    Component Value Date/Time   NA 139 08/01/2023 1442   NA 141 12/04/2018 0955   K 5.0 08/01/2023 1442   CL 102 08/01/2023 1442   CO2 29 08/01/2023 1442   GLUCOSE 115 (H) 08/01/2023 1442   BUN 26 (H) 08/01/2023 1442   BUN 25 12/04/2018 0955   CREATININE 1.23 (H) 08/01/2023 1442   CALCIUM  9.3 08/01/2023 1442   GFRNONAA 44 (L) 12/04/2018 0955   GFRAA 51 (L) 12/04/2018 0955    BNP No results found for: BNP  ProBNP    Component Value Date/Time   PROBNP 30.0 08/14/2008 2330    Specialty Problems       Pulmonary Problems   Perennial and seasonal allergic rhinitis   Community acquired pneumonia  Allergies  Allergen Reactions   Lisinopril  Swelling    Caused throat and tongue to swell (breathing was not affected, however)   Metformin  And Related Nausea And Vomiting   Darvocet [Propoxyphene N-Acetaminophen ] Nausea And Vomiting   Demerol [Meperidine] Nausea And Vomiting   Hydrochlorothiazide Nausea And Vomiting   Percocet [Oxycodone -Acetaminophen ] Nausea And Vomiting   Vicodin [Hydrocodone-Acetaminophen ] Nausea And Vomiting    Immunization History  Administered Date(s) Administered   Fluad Quad(high Dose 78+) 01/03/2019, 12/31/2019   HIB (PRP-T) 01/29/2016   INFLUENZA, HIGH DOSE SEASONAL PF 12/27/2016, 01/08/2022   Meningococcal Mcv4o 01/29/2016   Pneumococcal Conjugate-13 05/08/2014   Pneumococcal Polysaccharide-23 06/27/2012   Zoster Recombinant(Shingrix) 09/08/2022, 09/08/2022, 10/06/2022, 12/15/2022    Past Medical History:  Diagnosis Date   Allergy     Anemia    hx   Angio-edema    Arthritis    Diabetes mellitus without complication (HCC)    Type II   Dysrhythmia    Hx: of palpitations in 2010 only   GERD (gastroesophageal reflux  disease)    Hypertension    Pancreatic mass    /notes 02/25/2016   PONV (postoperative nausea and vomiting)    pt reports no PONV after biopsy on 12/24/15   Right knee DJD    Stress incontinence    Urinary frequency    Urinary urgency    Wears glasses     Tobacco History: Social History   Tobacco Use  Smoking Status Never  Smokeless Tobacco Never   Counseling given: Not Answered   Continue to not smoke  Outpatient Encounter Medications as of 11/30/2023  Medication Sig   ACCU-CHEK SOFTCLIX LANCETS lancets Use as instructed to test sugars 2-3 times daily. E11.9   Alcohol Swabs (B-D SINGLE USE SWABS REGULAR) PADS USE ONE SWAB EACH TIME SUGARS ARE TESTED AS DIRECTED   atorvastatin  (LIPITOR) 40 MG tablet TAKE 1 TABLET BY MOUTH EVERY DAY   Blood Glucose Monitoring Suppl (ACCU-CHEK GUIDE) w/Device KIT 1 each by Does not apply route 3 (three) times daily.   Cholecalciferol (VITAMIN D3) 2000 units TABS Take 2,000 Units by mouth daily.    glipiZIDE  (GLUCOTROL  XL) 5 MG 24 hr tablet TAKE 1 TABLET BY MOUTH EVERY DAY WITH BREAKFAST   glucose blood (ACCU-CHEK GUIDE) test strip Use as instructed   JANUMET  50-500 MG tablet TAKE 1 TABLET BY MOUTH TWICE A DAY WITH A MEAL   SHINGRIX injection    No facility-administered encounter medications on file as of 11/30/2023.     Review of Systems  Review of Systems  No chest pain with exertion.  No orthopnea or PND.  Comprehensive review of system otherwise negative. Physical Exam  BP 130/60   Pulse (!) 118   Ht 5' 5 (1.651 m)   Wt 158 lb (71.7 kg)   SpO2 95%   BMI 26.29 kg/m   Wt Readings from Last 5 Encounters:  11/30/23 158 lb (71.7 kg)  08/01/23 157 lb 2 oz (71.3 kg)  07/05/23 153 lb (69.4 kg)  04/18/23 159 lb 2 oz (72.2 kg)  09/24/22 154 lb 6 oz (70 kg)    BMI Readings from Last 5 Encounters:  11/30/23 26.29 kg/m  08/01/23 26.15 kg/m  07/05/23 25.46 kg/m  04/18/23 26.48 kg/m  09/24/22 25.69 kg/m     Physical  Exam General: Sitting in chair, no acute distress Eyes: EOMI, no icterus Neck: Supple, no JVP Pulmonary: Clear, no work of breathing Cardiovascular warm, no edema noted Abdomen: Nondistended MSK: No synovitis, no  joint effusion Neuro: Appropriate, no weakness Psych: Normal mood, full affect   Assessment & Plan:   Lung nodule: Approximately-1 mm right upper lobe.  Present on chest x-ray 06/2023 in setting of pneumonia symptoms.  Persistent in June 2025.  Repeat CT scan now, 5-month interval.  If same size or enlargement discussed biopsy, would move forward with this.  If decreasing in size or subsequently resolved, nothing more to do. Based on June scan, [facility] nodule risk calculator predicts 16.4% risk of malignancy.   Return if symptoms worsen or fail to improve, for f/u Dr. Annella.   Tracy JONELLE Annella, MD 11/30/2023   This appointment required 45 minutes of patient care (this includes precharting, chart review, review of results, face-to-face care, etc.).

## 2023-12-02 ENCOUNTER — Ambulatory Visit: Admitting: Family Medicine

## 2023-12-09 ENCOUNTER — Ambulatory Visit
Admission: RE | Admit: 2023-12-09 | Discharge: 2023-12-09 | Disposition: A | Discharge status: Home / Self Care | Source: Ambulatory Visit | Attending: Pulmonary Disease | Admitting: Pulmonary Disease

## 2023-12-09 DIAGNOSIS — R911 Solitary pulmonary nodule: Secondary | ICD-10-CM

## 2023-12-09 DIAGNOSIS — R918 Other nonspecific abnormal finding of lung field: Secondary | ICD-10-CM | POA: Diagnosis not present

## 2023-12-13 ENCOUNTER — Encounter: Payer: Self-pay | Admitting: Pulmonary Disease

## 2023-12-14 DIAGNOSIS — G8929 Other chronic pain: Secondary | ICD-10-CM | POA: Insufficient documentation

## 2023-12-15 ENCOUNTER — Encounter: Payer: Self-pay | Admitting: Family Medicine

## 2023-12-15 ENCOUNTER — Ambulatory Visit: Admitting: Family Medicine

## 2023-12-15 VITALS — BP 120/62 | HR 77 | Temp 97.8°F | Ht 65.0 in | Wt 155.2 lb

## 2023-12-15 DIAGNOSIS — Z23 Encounter for immunization: Secondary | ICD-10-CM | POA: Diagnosis not present

## 2023-12-15 DIAGNOSIS — E1329 Other specified diabetes mellitus with other diabetic kidney complication: Secondary | ICD-10-CM

## 2023-12-15 LAB — BASIC METABOLIC PANEL WITH GFR
BUN: 26 mg/dL — ABNORMAL HIGH (ref 6–23)
CO2: 27 meq/L (ref 19–32)
Calcium: 9.2 mg/dL (ref 8.4–10.5)
Chloride: 103 meq/L (ref 96–112)
Creatinine, Ser: 1.22 mg/dL — ABNORMAL HIGH (ref 0.40–1.20)
GFR: 42.65 mL/min — ABNORMAL LOW (ref >60.00)
Glucose, Bld: 114 mg/dL — ABNORMAL HIGH (ref 70–99)
Potassium: 4.5 meq/L (ref 3.5–5.1)
Sodium: 138 meq/L (ref 135–145)

## 2023-12-15 LAB — HEMOGLOBIN A1C: Hgb A1c MFr Bld: 7.9 % — ABNORMAL HIGH (ref 4.6–6.5)

## 2023-12-15 NOTE — Assessment & Plan Note (Addendum)
 Chronic problem.  On Glipizide  and Janumet  w/o difficulty.  UTD on microalbumin and foot exam.  Eye exam upcoming.  Currently asymptomatic.  Check labs.  Adjust meds prn

## 2023-12-15 NOTE — Progress Notes (Signed)
   Subjective:    Patient ID: Tracy Williams, female    DOB: 05-07-1945, 78 y.o.   MRN: [id number]  HPI DM- chronic problem, on Glipizide  5, Janumet  50/500mg  BID.  Last A1C 7%.  UTD on microalbumin, foot exam.  Due for upcoming eye exam.  No CP, SOB, HA's, visual changes, abd pain, N/V.  No numbness/tingling of hands/feet.   Review of Systems For ROS see HPI     Objective:   Physical Exam Vitals reviewed.  Constitutional:      General: She is not in acute distress.    Appearance: Normal appearance. She is well-developed. She is not ill-appearing.  HENT:     Head: Normocephalic and atraumatic.  Eyes:     Conjunctiva/sclera: Conjunctivae normal.     Pupils: Pupils are equal, round, and reactive to light.  Neck:     Thyroid : No thyromegaly.  Cardiovascular:     Rate and Rhythm: Normal rate and regular rhythm.     Pulses: Normal pulses.     Heart sounds: Normal heart sounds. No murmur heard. Pulmonary:     Effort: Pulmonary effort is normal. No respiratory distress.     Breath sounds: Normal breath sounds.  Abdominal:     General: There is no distension.     Palpations: Abdomen is soft.     Tenderness: There is no abdominal tenderness.  Musculoskeletal:     Cervical back: Normal range of motion and neck supple.     Right lower leg: No edema.     Left lower leg: No edema.  Lymphadenopathy:     Cervical: No cervical adenopathy.  Skin:    General: Skin is warm and dry.  Neurological:     General: No focal deficit present.     Mental Status: She is alert and oriented to person, place, and time.  Psychiatric:        Mood and Affect: Mood normal.        Behavior: Behavior normal.        Thought Content: Thought content normal.           Assessment & Plan:

## 2023-12-15 NOTE — Patient Instructions (Addendum)
 Follow up in 3-4 months to recheck sugar, blood pressure, and cholesterol We'll notify you of your lab results and make any changes if needed Keep up the good work on healthy diet and regular exercise- you look great!! Call with any questions or concerns Stay Safe!  Stay Healthy! Happy Belated Birthday!!

## 2023-12-16 ENCOUNTER — Ambulatory Visit: Payer: Self-pay | Admitting: Family Medicine

## 2023-12-16 NOTE — Progress Notes (Signed)
 Pt has been notified.

## 2023-12-17 ENCOUNTER — Other Ambulatory Visit: Payer: Self-pay | Admitting: Family Medicine

## 2023-12-18 NOTE — Telephone Encounter (Signed)
 Margie-CT scan demonstrates stability in right upper lobe anterior lesion.  About 11 mm.  No better.  If patient responds to message and would like to move forward with biopsy, can you please help arrange this?  If she would like to discuss this further in clinic please add her to my schedule when I return the week of October 6.  I have copied Dr. Sharron just in case patient agrees to move forward with biopsy so he can review case, okay if other bronchoscopist performs procedure based on endoscopy availability.

## 2023-12-19 NOTE — Telephone Encounter (Signed)
 Spoke to patient and scheduled appt for 12/26/2023 to discuss CT and bronch.  Nothing further needed.

## 2023-12-23 ENCOUNTER — Other Ambulatory Visit: Payer: Self-pay | Admitting: Family Medicine

## 2023-12-26 ENCOUNTER — Encounter: Payer: Self-pay | Admitting: Pulmonary Disease

## 2023-12-26 ENCOUNTER — Telehealth: Payer: Self-pay | Admitting: Pulmonary Disease

## 2023-12-26 ENCOUNTER — Ambulatory Visit: Admitting: Pulmonary Disease

## 2023-12-26 VITALS — BP 152/75 | HR 80 | Temp 97.8°F | Ht 65.0 in | Wt 157.4 lb

## 2023-12-26 DIAGNOSIS — R911 Solitary pulmonary nodule: Secondary | ICD-10-CM | POA: Insufficient documentation

## 2023-12-26 NOTE — Telephone Encounter (Signed)
 Please schedule the following:  Provider performing procedure:Dgayli, Assaker, Byrum Diagnosis: lung nodule Which side if for nodule / mass? right Procedure: Robotic navigational bronchoscopy with biopsy Has patient been spoken to by Provider and given informed consent?  Yes Anesthesia: General Do you need Fluro?  Yes Duration of procedure: Per provider Date: 01/06/2024 Alternate Date: Coordinate with patient, she is off on the above date and for this date Time: Whenever available Location: [facility], patient notified and okay with [facility] if can be performed on 10/17, if not she would prefer [facility] any other date Does patient have OSA?  No DM?  No or Latex allergy ?  No Medication Restriction/ Anticoagulate/Antiplatelet: No Pre-op Labs Ordered:determined by Anesthesia Imaging request: In PACS (If, SuperDimension CT Chest, please have STAT courier sent to ENDO)

## 2023-12-26 NOTE — H&P (View-Only) (Signed)
 @Patient  ID: Tracy Williams, female    DOB: 03/02/1946, 78 y.o.   MRN: [id number]  Chief Complaint  Patient presents with   Medical Management of Chronic Issues    Ct and biopsy    Referring provider: Mahlon Comer BRAVO, MD  HPI:   78 y.o. woman whom are seeing for evaluation of lung nodule.   Returns to discuss results of CT scan.  Stable 11 x 9 mm right upper lobe nodule with cystic component.  Present now for several months.  We discussed biopsy versus PET now versus repeat CT scan in 3 months.  After risks and benefits of each were discussed in detail, she elects for biopsy at this time.  HPI initial visit: Patient was in normal state of health.  Presented to PCP office 06/2023 with cough body aches etc.  Concern for community-acquired pneumonia versus viral infection.  Chest x-ray revealed a 8 to 9 mm right sided nodule.  This is followed up by CT scan 08/2023.  Read 09/2023.  This has a peripheral right upper lobe nodule with a central lucency or clearing unclear if cavitating versus dilated airway in that nodule on my review and interpretation.  She improved clinically.  Is here for follow-up of that CT scan.  No shortness of breath.  No cough.  Never smoker.  Mother had breast cancer metastasized to the lungs.  Husband had lung cancer and passed from this many years ago.    Questionaires / Pulmonary Flowsheets:   ACT:      No data to display          MMRC:     No data to display          Epworth:      No data to display          Tests:   FENO:  No results found for: NITRICOXIDE  PFT:     No data to display          WALK:      No data to display          Imaging: Personally viewed and as per EMR and discussion this note CT Super D Chest Wo Contrast Result Date: 12/14/2023 CLINICAL DATA:  Cavitary lung nodule, recent pneumonia * Tracking Code: BO * EXAM: CT CHEST WITHOUT CONTRAST TECHNIQUE: Multidetector CT imaging of the chest  was performed using thin slice collimation for electromagnetic bronchoscopy planning purposes, without intravenous contrast. RADIATION DOSE REDUCTION: This exam was performed according to the departmental dose-optimization program which includes automated exposure control, adjustment of the mA and/or kV according to patient size and/or use of iterative reconstruction technique. COMPARISON:  09/14/2023 FINDINGS: Cardiovascular: Aortic atherosclerosis. Normal heart size. Left coronary artery calcifications. No pericardial effusion. Mediastinum/Nodes: No enlarged mediastinal, hilar, or axillary lymph nodes. Small hiatal hernia. Thyroid  gland, trachea, and esophagus demonstrate no significant findings. Lungs/Pleura: Unchanged subpleural nodular consolidation in the peripheral anterior right upper lobe with a small focus of cystic change or cavitation measuring 1.1 x 0.8 cm (series 5, image 46). Multiple additional tiny, benign calcified granulomatous nodules requiring no specific further follow-up or characterization. Unchanged bandlike scarring and atelectasis of the bilateral lung bases. No pleural effusion or pneumothorax. Upper Abdomen: No acute abnormality. Musculoskeletal: No chest wall abnormality. No acute osseous findings. IMPRESSION: 1. Unchanged subpleural nodular consolidation in the peripheral anterior right upper lobe with a small focus of cystic change or cavitation measuring 1.1 x 0.8 cm. This remains nonspecific and although  interval stability is reassuring, modestly concerning for indolent adenocarcinoma. Consider additional imaging surveillance versus PET-CT characterization to exclude malignancy. 2. No evidence of lymphadenopathy or metastatic disease in the chest. 3. Coronary artery disease. Aortic Atherosclerosis (ICD10-I70.0). Electronically Signed   By: Marolyn JONETTA Jaksch M.D.   On: 12/14/2023 07:03    Lab Results: Personally reviewed CBC    Component Value Date/Time   WBC 11.5 (H) 08/01/2023  1442   RBC 4.35 08/01/2023 1442   HGB 12.8 08/01/2023 1442   HGB 12.9 12/04/2018 0955   HCT 39.5 08/01/2023 1442   HCT 39.8 12/04/2018 0955   PLT 350.0 08/01/2023 1442   PLT 381 12/04/2018 0955   MCV 90.8 08/01/2023 1442   MCV 91 12/04/2018 0955   MCH 29.4 12/04/2018 0955   MCH 29.1 10/13/2018 2200   MCHC 32.5 08/01/2023 1442   RDW 13.8 08/01/2023 1442   RDW 13.5 12/04/2018 0955   LYMPHSABS 2.3 08/01/2023 1442   LYMPHSABS 1.5 12/04/2018 0955   MONOABS 1.2 (H) 08/01/2023 1442   EOSABS 0.4 08/01/2023 1442   EOSABS 0.2 12/04/2018 0955   BASOSABS 0.1 08/01/2023 1442   BASOSABS 0.1 12/04/2018 0955    BMET    Component Value Date/Time   NA 138 12/15/2023 1053   NA 141 12/04/2018 0955   K 4.5 12/15/2023 1053   CL 103 12/15/2023 1053   CO2 27 12/15/2023 1053   GLUCOSE 114 (H) 12/15/2023 1053   BUN 26 (H) 12/15/2023 1053   BUN 25 12/04/2018 0955   CREATININE 1.22 (H) 12/15/2023 1053   CALCIUM  9.2 12/15/2023 1053   GFRNONAA 44 (L) 12/04/2018 0955   GFRAA 51 (L) 12/04/2018 0955    BNP No results found for: BNP  ProBNP    Component Value Date/Time   PROBNP 30.0 08/14/2008 2330    Specialty Problems       Pulmonary Problems   Perennial and seasonal allergic rhinitis   Community acquired pneumonia    Allergies  Allergen Reactions   Lisinopril  Swelling    Caused throat and tongue to swell (breathing was not affected, however)   Metformin  And Related Nausea And Vomiting   Darvocet [Propoxyphene N-Acetaminophen ] Nausea And Vomiting   Demerol [Meperidine] Nausea And Vomiting   Hydrochlorothiazide Nausea And Vomiting   Percocet [Oxycodone -Acetaminophen ] Nausea And Vomiting   Vicodin [Hydrocodone-Acetaminophen ] Nausea And Vomiting    Immunization History  Administered Date(s) Administered   Fluad Quad(high Dose 65+) 01/03/2019, 12/31/2019   Fluad Trivalent(High Dose 65+) 12/15/2023   HIB (PRP-T) 01/29/2016   INFLUENZA, HIGH DOSE SEASONAL PF 12/27/2016,  01/08/2022   Meningococcal Mcv4o 01/29/2016   Pneumococcal Conjugate-13 05/08/2014   Pneumococcal Polysaccharide-23 06/27/2012   Zoster Recombinant(Shingrix) 09/08/2022, 09/08/2022, 10/06/2022, 12/15/2022    Past Medical History:  Diagnosis Date   Allergy     Anemia    hx   Angio-edema    Arthritis    Diabetes mellitus without complication (HCC)    Type II   Dysrhythmia    Hx: of palpitations in 2010 only   GERD (gastroesophageal reflux disease)    Hypertension    Pancreatic mass    /notes 02/25/2016   PONV (postoperative nausea and vomiting)    pt reports no PONV after biopsy on 12/24/15   Right knee DJD    Stress incontinence    Urinary frequency    Urinary urgency    Wears glasses     Tobacco History: Social History   Tobacco Use  Smoking Status Never  Smokeless Tobacco Never  Counseling given: Not Answered   Continue to not smoke  Outpatient Encounter Medications as of 12/26/2023  Medication Sig   ACCU-CHEK SOFTCLIX LANCETS lancets Use as instructed to test sugars 2-3 times daily. E11.9   Alcohol Swabs (B-D SINGLE USE SWABS REGULAR) PADS USE ONE SWAB EACH TIME SUGARS ARE TESTED AS DIRECTED   atorvastatin  (LIPITOR) 40 MG tablet TAKE 1 TABLET BY MOUTH EVERY DAY   Blood Glucose Monitoring Suppl (ACCU-CHEK GUIDE) w/Device KIT 1 each by Does not apply route 3 (three) times daily.   Cholecalciferol (VITAMIN D3) 2000 units TABS Take 2,000 Units by mouth daily.    glipiZIDE  (GLUCOTROL  XL) 5 MG 24 hr tablet TAKE 1 TABLET BY MOUTH EVERY DAY WITH BREAKFAST   glucose blood (ACCU-CHEK GUIDE) test strip Use as instructed   JANUMET  50-500 MG tablet TAKE 1 TABLET BY MOUTH TWICE A DAY WITH A MEAL   SHINGRIX injection    No facility-administered encounter medications on file as of 12/26/2023.     Review of Systems  Review of Systems  N/a Physical Exam  BP (!) 152/75   Pulse 80   Temp 97.8 F (36.6 C) (Oral)   Ht 5' 5 (1.651 m)   Wt 157 lb 6.4 oz (71.4 kg)   SpO2  96%   BMI 26.19 kg/m   Wt Readings from Last 5 Encounters:  12/26/23 157 lb 6.4 oz (71.4 kg)  12/15/23 155 lb 4 oz (70.4 kg)  11/30/23 158 lb (71.7 kg)  08/01/23 157 lb 2 oz (71.3 kg)  07/05/23 153 lb (69.4 kg)    BMI Readings from Last 5 Encounters:  12/26/23 26.19 kg/m  12/15/23 25.83 kg/m  11/30/23 26.29 kg/m  08/01/23 26.15 kg/m  07/05/23 25.46 kg/m     Physical Exam General: Sitting in chair, no acute distress Eyes: EOMI, no icterus Neck: Supple, no JVP Pulmonary: Clear, no work of breathing Cardiovascular warm, no edema noted Abdomen: Nondistended MSK: No synovitis, no joint effusion Neuro: Appropriate, no weakness Psych: Normal mood, full affect   Assessment & Plan:   Lung nodule: Approximately-1 mm right upper lobe.  Present on chest x-ray 06/2023 in setting of pneumonia symptoms.  Present in June 2025.  Repeat CT scan at 44-month interval unchanged.  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  Given size is right at PET threshold, significant chance of negative or false negative PET.  Discussed pursuing PET now versus repeat CT scan in 3 months versus consideration of biopsy now.  She elects move forward with biopsy.  Otherwise healthy, doing well.  No follow-ups on file.   Donnice JONELLE Beals, MD 12/26/2023   I spent 31 minutes in the care of the patient including face-to-face visit, review of records, coordination of care.

## 2023-12-26 NOTE — Patient Instructions (Signed)
 Good to see you again  I will send messages to get the process started to arrange a biopsy, preferably on October 17.

## 2023-12-26 NOTE — Progress Notes (Signed)
 @Patient  ID: Tracy Williams, female    DOB: 03/02/1946, 78 y.o.   MRN: [id number]  Chief Complaint  Patient presents with   Medical Management of Chronic Issues    Ct and biopsy    Referring provider: Mahlon Comer BRAVO, MD  HPI:   78 y.o. woman whom are seeing for evaluation of lung nodule.   Returns to discuss results of CT scan.  Stable 11 x 9 mm right upper lobe nodule with cystic component.  Present now for several months.  We discussed biopsy versus PET now versus repeat CT scan in 3 months.  After risks and benefits of each were discussed in detail, she elects for biopsy at this time.  HPI initial visit: Patient was in normal state of health.  Presented to PCP office 06/2023 with cough body aches etc.  Concern for community-acquired pneumonia versus viral infection.  Chest x-ray revealed a 8 to 9 mm right sided nodule.  This is followed up by CT scan 08/2023.  Read 09/2023.  This has a peripheral right upper lobe nodule with a central lucency or clearing unclear if cavitating versus dilated airway in that nodule on my review and interpretation.  She improved clinically.  Is here for follow-up of that CT scan.  No shortness of breath.  No cough.  Never smoker.  Mother had breast cancer metastasized to the lungs.  Husband had lung cancer and passed from this many years ago.    Questionaires / Pulmonary Flowsheets:   ACT:      No data to display          MMRC:     No data to display          Epworth:      No data to display          Tests:   FENO:  No results found for: NITRICOXIDE  PFT:     No data to display          WALK:      No data to display          Imaging: Personally viewed and as per EMR and discussion this note CT Super D Chest Wo Contrast Result Date: 12/14/2023 CLINICAL DATA:  Cavitary lung nodule, recent pneumonia * Tracking Code: BO * EXAM: CT CHEST WITHOUT CONTRAST TECHNIQUE: Multidetector CT imaging of the chest  was performed using thin slice collimation for electromagnetic bronchoscopy planning purposes, without intravenous contrast. RADIATION DOSE REDUCTION: This exam was performed according to the departmental dose-optimization program which includes automated exposure control, adjustment of the mA and/or kV according to patient size and/or use of iterative reconstruction technique. COMPARISON:  09/14/2023 FINDINGS: Cardiovascular: Aortic atherosclerosis. Normal heart size. Left coronary artery calcifications. No pericardial effusion. Mediastinum/Nodes: No enlarged mediastinal, hilar, or axillary lymph nodes. Small hiatal hernia. Thyroid  gland, trachea, and esophagus demonstrate no significant findings. Lungs/Pleura: Unchanged subpleural nodular consolidation in the peripheral anterior right upper lobe with a small focus of cystic change or cavitation measuring 1.1 x 0.8 cm (series 5, image 46). Multiple additional tiny, benign calcified granulomatous nodules requiring no specific further follow-up or characterization. Unchanged bandlike scarring and atelectasis of the bilateral lung bases. No pleural effusion or pneumothorax. Upper Abdomen: No acute abnormality. Musculoskeletal: No chest wall abnormality. No acute osseous findings. IMPRESSION: 1. Unchanged subpleural nodular consolidation in the peripheral anterior right upper lobe with a small focus of cystic change or cavitation measuring 1.1 x 0.8 cm. This remains nonspecific and although  interval stability is reassuring, modestly concerning for indolent adenocarcinoma. Consider additional imaging surveillance versus PET-CT characterization to exclude malignancy. 2. No evidence of lymphadenopathy or metastatic disease in the chest. 3. Coronary artery disease. Aortic Atherosclerosis (ICD10-I70.0). Electronically Signed   By: Marolyn JONETTA Jaksch M.D.   On: 12/14/2023 07:03    Lab Results: Personally reviewed CBC    Component Value Date/Time   WBC 11.5 (H) 08/01/2023  1442   RBC 4.35 08/01/2023 1442   HGB 12.8 08/01/2023 1442   HGB 12.9 12/04/2018 0955   HCT 39.5 08/01/2023 1442   HCT 39.8 12/04/2018 0955   PLT 350.0 08/01/2023 1442   PLT 381 12/04/2018 0955   MCV 90.8 08/01/2023 1442   MCV 91 12/04/2018 0955   MCH 29.4 12/04/2018 0955   MCH 29.1 10/13/2018 2200   MCHC 32.5 08/01/2023 1442   RDW 13.8 08/01/2023 1442   RDW 13.5 12/04/2018 0955   LYMPHSABS 2.3 08/01/2023 1442   LYMPHSABS 1.5 12/04/2018 0955   MONOABS 1.2 (H) 08/01/2023 1442   EOSABS 0.4 08/01/2023 1442   EOSABS 0.2 12/04/2018 0955   BASOSABS 0.1 08/01/2023 1442   BASOSABS 0.1 12/04/2018 0955    BMET    Component Value Date/Time   NA 138 12/15/2023 1053   NA 141 12/04/2018 0955   K 4.5 12/15/2023 1053   CL 103 12/15/2023 1053   CO2 27 12/15/2023 1053   GLUCOSE 114 (H) 12/15/2023 1053   BUN 26 (H) 12/15/2023 1053   BUN 25 12/04/2018 0955   CREATININE 1.22 (H) 12/15/2023 1053   CALCIUM  9.2 12/15/2023 1053   GFRNONAA 44 (L) 12/04/2018 0955   GFRAA 51 (L) 12/04/2018 0955    BNP No results found for: BNP  ProBNP    Component Value Date/Time   PROBNP 30.0 08/14/2008 2330    Specialty Problems       Pulmonary Problems   Perennial and seasonal allergic rhinitis   Community acquired pneumonia    Allergies  Allergen Reactions   Lisinopril  Swelling    Caused throat and tongue to swell (breathing was not affected, however)   Metformin  And Related Nausea And Vomiting   Darvocet [Propoxyphene N-Acetaminophen ] Nausea And Vomiting   Demerol [Meperidine] Nausea And Vomiting   Hydrochlorothiazide Nausea And Vomiting   Percocet [Oxycodone -Acetaminophen ] Nausea And Vomiting   Vicodin [Hydrocodone-Acetaminophen ] Nausea And Vomiting    Immunization History  Administered Date(s) Administered   Fluad Quad(high Dose 65+) 01/03/2019, 12/31/2019   Fluad Trivalent(High Dose 65+) 12/15/2023   HIB (PRP-T) 01/29/2016   INFLUENZA, HIGH DOSE SEASONAL PF 12/27/2016,  01/08/2022   Meningococcal Mcv4o 01/29/2016   Pneumococcal Conjugate-13 05/08/2014   Pneumococcal Polysaccharide-23 06/27/2012   Zoster Recombinant(Shingrix) 09/08/2022, 09/08/2022, 10/06/2022, 12/15/2022    Past Medical History:  Diagnosis Date   Allergy     Anemia    hx   Angio-edema    Arthritis    Diabetes mellitus without complication (HCC)    Type II   Dysrhythmia    Hx: of palpitations in 2010 only   GERD (gastroesophageal reflux disease)    Hypertension    Pancreatic mass    /notes 02/25/2016   PONV (postoperative nausea and vomiting)    pt reports no PONV after biopsy on 12/24/15   Right knee DJD    Stress incontinence    Urinary frequency    Urinary urgency    Wears glasses     Tobacco History: Social History   Tobacco Use  Smoking Status Never  Smokeless Tobacco Never  Counseling given: Not Answered   Continue to not smoke  Outpatient Encounter Medications as of 12/26/2023  Medication Sig   ACCU-CHEK SOFTCLIX LANCETS lancets Use as instructed to test sugars 2-3 times daily. E11.9   Alcohol Swabs (B-D SINGLE USE SWABS REGULAR) PADS USE ONE SWAB EACH TIME SUGARS ARE TESTED AS DIRECTED   atorvastatin  (LIPITOR) 40 MG tablet TAKE 1 TABLET BY MOUTH EVERY DAY   Blood Glucose Monitoring Suppl (ACCU-CHEK GUIDE) w/Device KIT 1 each by Does not apply route 3 (three) times daily.   Cholecalciferol (VITAMIN D3) 2000 units TABS Take 2,000 Units by mouth daily.    glipiZIDE  (GLUCOTROL  XL) 5 MG 24 hr tablet TAKE 1 TABLET BY MOUTH EVERY DAY WITH BREAKFAST   glucose blood (ACCU-CHEK GUIDE) test strip Use as instructed   JANUMET  50-500 MG tablet TAKE 1 TABLET BY MOUTH TWICE A DAY WITH A MEAL   SHINGRIX injection    No facility-administered encounter medications on file as of 12/26/2023.     Review of Systems  Review of Systems  N/a Physical Exam  BP (!) 152/75   Pulse 80   Temp 97.8 F (36.6 C) (Oral)   Ht 5' 5 (1.651 m)   Wt 157 lb 6.4 oz (71.4 kg)   SpO2  96%   BMI 26.19 kg/m   Wt Readings from Last 5 Encounters:  12/26/23 157 lb 6.4 oz (71.4 kg)  12/15/23 155 lb 4 oz (70.4 kg)  11/30/23 158 lb (71.7 kg)  08/01/23 157 lb 2 oz (71.3 kg)  07/05/23 153 lb (69.4 kg)    BMI Readings from Last 5 Encounters:  12/26/23 26.19 kg/m  12/15/23 25.83 kg/m  11/30/23 26.29 kg/m  08/01/23 26.15 kg/m  07/05/23 25.46 kg/m     Physical Exam General: Sitting in chair, no acute distress Eyes: EOMI, no icterus Neck: Supple, no JVP Pulmonary: Clear, no work of breathing Cardiovascular warm, no edema noted Abdomen: Nondistended MSK: No synovitis, no joint effusion Neuro: Appropriate, no weakness Psych: Normal mood, full affect   Assessment & Plan:   Lung nodule: Approximately-1 mm right upper lobe.  Present on chest x-ray 06/2023 in setting of pneumonia symptoms.  Present in June 2025.  Repeat CT scan at 44-month interval unchanged.  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  Given size is right at PET threshold, significant chance of negative or false negative PET.  Discussed pursuing PET now versus repeat CT scan in 3 months versus consideration of biopsy now.  She elects move forward with biopsy.  Otherwise healthy, doing well.  No follow-ups on file.   Tracy JONELLE Beals, MD 12/26/2023   I spent 31 minutes in the care of the patient including face-to-face visit, review of records, coordination of care.

## 2023-12-27 ENCOUNTER — Encounter: Payer: Self-pay | Admitting: Emergency Medicine

## 2023-12-27 NOTE — Telephone Encounter (Signed)
 Patient has been scheduled on 01/09/24 at 12pm with Dr.Byrum. Routing to [employer] for Authorization. Letter has been mailed and patient is aware.

## 2023-12-28 NOTE — Telephone Encounter (Signed)
 I have called the patient and bumped up her time to 11 and patient is aware to be at [facility] by 8:30 am

## 2024-01-05 ENCOUNTER — Encounter (HOSPITAL_COMMUNITY): Payer: Self-pay | Admitting: Emergency Medicine

## 2024-01-05 ENCOUNTER — Other Ambulatory Visit: Payer: Self-pay

## 2024-01-05 NOTE — Progress Notes (Signed)
 SDW CALL  Patient was given pre-op instructions over the phone. The opportunity was given for the patient to ask questions. No further questions asked. Patient verbalized understanding of instructions given.   PCP - Dr. Comer Greet Cardiologist - denies  PPM/ICD - denies   Chest x-ray - 07/17/23 EKG - DOS Stress Test - denies ECHO - possibly in 2018 Cardiac Cath - denies  Sleep Study - denies CPAP - n/a  Fasting Blood Sugar - unknown Patient does not check blood sugar at home  Last dose of GLP1 agonist-  n/a GLP1 instructions:  n/a  Blood Thinner Instructions: n/a Aspirin Instructions: n/a  ERAS Protcol - NPO PRE-SURGERY Ensure or G2- n/a  COVID TEST- n/a   Anesthesia review: no  Patient denies shortness of breath, fever, cough and chest pain over the phone call   All instructions explained to the patient, with a verbal understanding of the material. Patient agrees to go over the instructions while at home for a better understanding.    Patient was educated that she needs a responsible adult to remain with her at all times for 24 hours.

## 2024-01-09 ENCOUNTER — Ambulatory Visit (HOSPITAL_COMMUNITY): Admitting: Anesthesiology

## 2024-01-09 ENCOUNTER — Ambulatory Visit (HOSPITAL_COMMUNITY)

## 2024-01-09 ENCOUNTER — Encounter (HOSPITAL_COMMUNITY): Payer: Self-pay | Admitting: Emergency Medicine

## 2024-01-09 ENCOUNTER — Encounter (HOSPITAL_COMMUNITY): Admission: RE | Disposition: A | Payer: Self-pay | Source: Home / Self Care | Attending: Emergency Medicine

## 2024-01-09 ENCOUNTER — Other Ambulatory Visit: Payer: Self-pay

## 2024-01-09 ENCOUNTER — Inpatient Hospital Stay (HOSPITAL_COMMUNITY)
Admission: RE | Admit: 2024-01-09 | Discharge: 2024-01-10 | DRG: 206 | Disposition: A | Discharge status: Home / Self Care | Attending: Emergency Medicine | Admitting: Emergency Medicine

## 2024-01-09 DIAGNOSIS — Z803 Family history of malignant neoplasm of breast: Secondary | ICD-10-CM

## 2024-01-09 DIAGNOSIS — Z79899 Other long term (current) drug therapy: Secondary | ICD-10-CM

## 2024-01-09 DIAGNOSIS — Z833 Family history of diabetes mellitus: Secondary | ICD-10-CM | POA: Diagnosis not present

## 2024-01-09 DIAGNOSIS — J939 Pneumothorax, unspecified: Secondary | ICD-10-CM | POA: Diagnosis not present

## 2024-01-09 DIAGNOSIS — R9389 Abnormal findings on diagnostic imaging of other specified body structures: Secondary | ICD-10-CM | POA: Diagnosis not present

## 2024-01-09 DIAGNOSIS — E1329 Other specified diabetes mellitus with other diabetic kidney complication: Secondary | ICD-10-CM | POA: Diagnosis present

## 2024-01-09 DIAGNOSIS — Z96651 Presence of right artificial knee joint: Secondary | ICD-10-CM | POA: Diagnosis present

## 2024-01-09 DIAGNOSIS — Z9081 Acquired absence of spleen: Secondary | ICD-10-CM | POA: Diagnosis not present

## 2024-01-09 DIAGNOSIS — E119 Type 2 diabetes mellitus without complications: Secondary | ICD-10-CM

## 2024-01-09 DIAGNOSIS — Z7984 Long term (current) use of oral hypoglycemic drugs: Secondary | ICD-10-CM

## 2024-01-09 DIAGNOSIS — J9811 Atelectasis: Secondary | ICD-10-CM | POA: Diagnosis not present

## 2024-01-09 DIAGNOSIS — Z8701 Personal history of pneumonia (recurrent): Secondary | ICD-10-CM

## 2024-01-09 DIAGNOSIS — R911 Solitary pulmonary nodule: Secondary | ICD-10-CM

## 2024-01-09 DIAGNOSIS — Z8249 Family history of ischemic heart disease and other diseases of the circulatory system: Secondary | ICD-10-CM

## 2024-01-09 DIAGNOSIS — I10 Essential (primary) hypertension: Secondary | ICD-10-CM

## 2024-01-09 DIAGNOSIS — E663 Overweight: Secondary | ICD-10-CM | POA: Diagnosis present

## 2024-01-09 DIAGNOSIS — J984 Other disorders of lung: Secondary | ICD-10-CM | POA: Diagnosis not present

## 2024-01-09 DIAGNOSIS — Z4682 Encounter for fitting and adjustment of non-vascular catheter: Secondary | ICD-10-CM | POA: Diagnosis not present

## 2024-01-09 DIAGNOSIS — K219 Gastro-esophageal reflux disease without esophagitis: Secondary | ICD-10-CM | POA: Diagnosis present

## 2024-01-09 DIAGNOSIS — E785 Hyperlipidemia, unspecified: Secondary | ICD-10-CM | POA: Diagnosis present

## 2024-01-09 DIAGNOSIS — Z90411 Acquired partial absence of pancreas: Secondary | ICD-10-CM

## 2024-01-09 DIAGNOSIS — Z48813 Encounter for surgical aftercare following surgery on the respiratory system: Secondary | ICD-10-CM | POA: Diagnosis not present

## 2024-01-09 DIAGNOSIS — R918 Other nonspecific abnormal finding of lung field: Secondary | ICD-10-CM | POA: Diagnosis not present

## 2024-01-09 DIAGNOSIS — J95811 Postprocedural pneumothorax: Principal | ICD-10-CM

## 2024-01-09 LAB — CBC
HCT: 36.7 % (ref 36.0–46.0)
Hemoglobin: 11.9 g/dL — ABNORMAL LOW (ref 12.0–15.0)
MCH: 29.7 pg (ref 26.0–34.0)
MCHC: 32.4 g/dL (ref 30.0–36.0)
MCV: 91.5 fL (ref 80.0–100.0)
Platelets: 355 K/uL (ref 150–400)
RBC: 4.01 MIL/uL (ref 3.87–5.11)
RDW: 14.2 % (ref 11.5–15.5)
WBC: 11.4 K/uL — ABNORMAL HIGH (ref 4.0–10.5)
nRBC: 0 % (ref 0.0–0.2)

## 2024-01-09 LAB — GLUCOSE, CAPILLARY
Glucose-Capillary: 118 mg/dL — ABNORMAL HIGH (ref 70–99)
Glucose-Capillary: 135 mg/dL — ABNORMAL HIGH (ref 70–99)
Glucose-Capillary: 202 mg/dL — ABNORMAL HIGH (ref 70–99)

## 2024-01-09 SURGERY — VIDEO BRONCHOSCOPY WITH ENDOBRONCHIAL NAVIGATION
Anesthesia: General | Laterality: Right

## 2024-01-09 MED ORDER — SUGAMMADEX SODIUM 200 MG/2ML IV SOLN
INTRAVENOUS | Status: DC | PRN
  Administered 2024-01-09: 140 mg via INTRAVENOUS

## 2024-01-09 MED ORDER — KETOROLAC TROMETHAMINE 30 MG/ML IJ SOLN
INTRAMUSCULAR | Status: AC
  Filled 2024-01-09: qty 1

## 2024-01-09 MED ORDER — CHLORHEXIDINE GLUCONATE 0.12 % MT SOLN: 15.0000 mL | OROMUCOSAL | Status: AC

## 2024-01-09 MED ORDER — KETOROLAC TROMETHAMINE 15 MG/ML IJ SOLN
INTRAMUSCULAR | Status: AC
  Filled 2024-01-09: qty 1

## 2024-01-09 MED ORDER — LIDOCAINE 2% (20 MG/ML) 5 ML SYRINGE
INTRAMUSCULAR | Status: DC | PRN
  Administered 2024-01-09: 100 mg via INTRAVENOUS

## 2024-01-09 MED ORDER — SITAGLIPTIN PHOS-METFORMIN HCL 50-500 MG PO TABS: 1.0000 | ORAL_TABLET | Freq: Two times a day (BID) | ORAL | Status: DC

## 2024-01-09 MED ORDER — ACETAMINOPHEN 650 MG RE SUPP: 650.0000 mg | Freq: Four times a day (QID) | RECTAL | Status: DC | PRN

## 2024-01-09 MED ORDER — FENTANYL CITRATE (PF) 100 MCG/2ML IJ SOLN
25.0000 ug | INTRAMUSCULAR | Status: DC | PRN
  Administered 2024-01-09: 25 ug via INTRAVENOUS
  Administered 2024-01-09: 50 ug via INTRAVENOUS
  Administered 2024-01-09: 25 ug via INTRAVENOUS

## 2024-01-09 MED ORDER — PROPOFOL 500 MG/50ML IV EMUL
INTRAVENOUS | Status: DC | PRN
  Administered 2024-01-09: 150 ug/kg/min via INTRAVENOUS

## 2024-01-09 MED ORDER — FENTANYL CITRATE (PF) 100 MCG/2ML IJ SOLN
INTRAMUSCULAR | Status: AC
  Filled 2024-01-09: qty 2

## 2024-01-09 MED ORDER — LINAGLIPTIN 5 MG PO TABS
5.0000 mg | ORAL_TABLET | Freq: Every day | ORAL | Status: DC
  Filled 2024-01-09 (×3): qty 1

## 2024-01-09 MED ORDER — CHLORHEXIDINE GLUCONATE 0.12 % MT SOLN
OROMUCOSAL | Status: AC
  Administered 2024-01-09: 15 mL via OROMUCOSAL
  Filled 2024-01-09: qty 15

## 2024-01-09 MED ORDER — ACETAMINOPHEN 325 MG PO TABS
650.0000 mg | ORAL_TABLET | Freq: Four times a day (QID) | ORAL | Status: DC | PRN
  Administered 2024-01-09: 650 mg via ORAL
  Filled 2024-01-09: qty 2

## 2024-01-09 MED ORDER — GLIPIZIDE ER 5 MG PO TB24
5.0000 mg | ORAL_TABLET | Freq: Every day | ORAL | Status: DC
  Administered 2024-01-10: 5 mg via ORAL
  Filled 2024-01-09 (×2): qty 1

## 2024-01-09 MED ORDER — ONDANSETRON HCL 4 MG/2ML IJ SOLN
INTRAMUSCULAR | Status: DC | PRN
  Administered 2024-01-09: 4 mg via INTRAVENOUS

## 2024-01-09 MED ORDER — ROCURONIUM BROMIDE 10 MG/ML (PF) SYRINGE
PREFILLED_SYRINGE | INTRAVENOUS | Status: DC | PRN
  Administered 2024-01-09: 60 mg via INTRAVENOUS

## 2024-01-09 MED ORDER — DIPHENHYDRAMINE HCL 50 MG/ML IJ SOLN
25.0000 mg | INTRAMUSCULAR | Status: AC
  Administered 2024-01-09: 25 mg via INTRAVENOUS

## 2024-01-09 MED ORDER — SODIUM CHLORIDE 0.9% FLUSH
3.0000 mL | Freq: Two times a day (BID) | INTRAVENOUS | Status: DC
  Administered 2024-01-10: 3 mL via INTRAVENOUS

## 2024-01-09 MED ORDER — PROPOFOL 10 MG/ML IV BOLUS
INTRAVENOUS | Status: DC | PRN
Start: 2024-01-09 — End: 2024-01-09
  Administered 2024-01-09: 150 mg via INTRAVENOUS
  Administered 2024-01-09: 50 mg via INTRAVENOUS

## 2024-01-09 MED ORDER — TRAMADOL HCL 50 MG PO TABS
25.0000 mg | ORAL_TABLET | Freq: Four times a day (QID) | ORAL | Status: DC | PRN
  Administered 2024-01-09: 25 mg via ORAL
  Filled 2024-01-09: qty 1

## 2024-01-09 MED ORDER — PHENYLEPHRINE HCL-NACL 20-0.9 MG/250ML-% IV SOLN
INTRAVENOUS | Status: DC | PRN
  Administered 2024-01-09: 10 ug/min via INTRAVENOUS

## 2024-01-09 MED ORDER — VITAMIN D 25 MCG (1000 UNIT) PO TABS
2000.0000 [IU] | ORAL_TABLET | Freq: Every day | ORAL | Status: DC
  Administered 2024-01-09 – 2024-01-10 (×2): 2000 [IU] via ORAL
  Filled 2024-01-09 (×2): qty 2

## 2024-01-09 MED ORDER — DIPHENHYDRAMINE HCL 50 MG/ML IJ SOLN
INTRAMUSCULAR | Status: AC
  Filled 2024-01-09: qty 1

## 2024-01-09 MED ORDER — SODIUM CHLORIDE 0.9 % IV SOLN: 250.0000 mL | INTRAVENOUS | Status: AC | PRN

## 2024-01-09 MED ORDER — ONDANSETRON HCL 4 MG PO TABS: 4.0000 mg | ORAL_TABLET | Freq: Four times a day (QID) | ORAL | Status: DC | PRN

## 2024-01-09 MED ORDER — INSULIN ASPART 100 UNIT/ML IJ SOLN
0.0000 [IU] | Freq: Three times a day (TID) | INTRAMUSCULAR | Status: DC
  Administered 2024-01-09 – 2024-01-10 (×2): 2 [IU] via SUBCUTANEOUS
  Administered 2024-01-10: 5 [IU] via SUBCUTANEOUS
  Administered 2024-01-10: 2 [IU] via SUBCUTANEOUS

## 2024-01-09 MED ORDER — ATORVASTATIN CALCIUM 40 MG PO TABS
40.0000 mg | ORAL_TABLET | Freq: Every day | ORAL | Status: DC
  Filled 2024-01-09 (×2): qty 1

## 2024-01-09 MED ORDER — SENNOSIDES-DOCUSATE SODIUM 8.6-50 MG PO TABS: 1.0000 | ORAL_TABLET | Freq: Every evening | ORAL | Status: DC | PRN

## 2024-01-09 MED ORDER — ONDANSETRON HCL 4 MG/2ML IJ SOLN: 4.0000 mg | Freq: Once | INTRAMUSCULAR | Status: DC | PRN

## 2024-01-09 MED ORDER — PHENYLEPHRINE 80 MCG/ML (10ML) SYRINGE FOR IV PUSH (FOR BLOOD PRESSURE SUPPORT)
PREFILLED_SYRINGE | INTRAVENOUS | Status: DC | PRN
  Administered 2024-01-09: 160 ug via INTRAVENOUS

## 2024-01-09 MED ORDER — LACTATED RINGERS IV SOLN: INTRAVENOUS | Status: DC

## 2024-01-09 MED ORDER — SODIUM CHLORIDE 0.9% FLUSH
3.0000 mL | INTRAVENOUS | Status: DC | PRN
  Administered 2024-01-10: 3 mL via INTRAVENOUS

## 2024-01-09 MED ORDER — HYDROMORPHONE HCL 1 MG/ML IJ SOLN
INTRAMUSCULAR | Status: AC
  Filled 2024-01-09: qty 1

## 2024-01-09 MED ORDER — FENTANYL CITRATE (PF) 250 MCG/5ML IJ SOLN
INTRAMUSCULAR | Status: DC | PRN
  Administered 2024-01-09 (×2): 50 ug via INTRAVENOUS

## 2024-01-09 MED ORDER — AMISULPRIDE (ANTIEMETIC) 5 MG/2ML IV SOLN: 10.0000 mg | Freq: Once | INTRAVENOUS | Status: DC | PRN

## 2024-01-09 MED ORDER — KETOROLAC TROMETHAMINE 15 MG/ML IJ SOLN
15.0000 mg | Freq: Four times a day (QID) | INTRAMUSCULAR | Status: DC | PRN
  Administered 2024-01-09 – 2024-01-10 (×5): 15 mg via INTRAVENOUS
  Filled 2024-01-09 (×4): qty 1

## 2024-01-09 MED ORDER — METFORMIN HCL 500 MG PO TABS
500.0000 mg | ORAL_TABLET | Freq: Two times a day (BID) | ORAL | Status: DC
Start: 2024-01-09 — End: 2024-01-11
  Filled 2024-01-09 (×2): qty 1

## 2024-01-09 MED ORDER — HYDROMORPHONE HCL 1 MG/ML IJ SOLN
0.2500 mg | INTRAMUSCULAR | Status: DC | PRN
  Administered 2024-01-09 (×2): 0.5 mg via INTRAVENOUS

## 2024-01-09 MED ORDER — DEXAMETHASONE SOD PHOSPHATE PF 10 MG/ML IJ SOLN
INTRAMUSCULAR | Status: DC | PRN
  Administered 2024-01-09: 10 mg via INTRAVENOUS

## 2024-01-09 MED ORDER — ONDANSETRON HCL 4 MG/2ML IJ SOLN: 4.0000 mg | Freq: Four times a day (QID) | INTRAMUSCULAR | Status: DC | PRN

## 2024-01-09 SURGICAL SUPPLY — 37 items
ADAPTER BRONCHOSCOPE OLYMPUS (ADAPTER) ×2 IMPLANT
ADAPTER VALVE BIOPSY EBUS (MISCELLANEOUS) IMPLANT
BAG COUNTER SPONGE SURGICOUNT (BAG) ×2 IMPLANT
BRUSH CYTOL CELLEBRITY 1.5X140 (MISCELLANEOUS) ×2 IMPLANT
BRUSH SUPERTRAX BIOPSY (INSTRUMENTS) IMPLANT
BRUSH SUPERTRAX NDL-TIP CYTO (INSTRUMENTS) ×2 IMPLANT
CANISTER SUCTION 3000ML PPV (SUCTIONS) ×2 IMPLANT
CNTNR URN SCR LID CUP LEK RST (MISCELLANEOUS) ×2 IMPLANT
COVER BACK TABLE 60X90IN (DRAPES) ×2 IMPLANT
FILTER STRAW FLUID ASPIR (MISCELLANEOUS) IMPLANT
FORCEPS BIOP 1.5 SINGLE USE (MISCELLANEOUS) ×2 IMPLANT
FORCEPS BIOP SUPERTRX PREMAR (INSTRUMENTS) ×2 IMPLANT
GAUZE SPONGE 4X4 12PLY STRL (GAUZE/BANDAGES/DRESSINGS) ×2 IMPLANT
GLOVE BIO SURGEON STRL SZ7.5 (GLOVE) ×4 IMPLANT
GOWN STRL REUS W/ TWL LRG LVL3 (GOWN DISPOSABLE) ×4 IMPLANT
KIT CLEAN ENDO COMPLIANCE (KITS) ×2 IMPLANT
KIT LOCATABLE GUIDE (CANNULA) IMPLANT
KIT MARKER FIDUCIAL DELIVERY (KITS) IMPLANT
KIT TURNOVER KIT B (KITS) ×2 IMPLANT
MARKER SKIN DUAL TIP RULER LAB (MISCELLANEOUS) ×2 IMPLANT
NDL SUPERTRX PREMARK BIOPSY (NEEDLE) ×2 IMPLANT
NEEDLE SUPERTRX PREMARK BIOPSY (NEEDLE) ×2 IMPLANT
OIL SILICONE PENTAX (PARTS (SERVICE/REPAIRS)) ×2 IMPLANT
PAD ARMBOARD POSITIONER FOAM (MISCELLANEOUS) ×4 IMPLANT
PATCHES PATIENT (LABEL) ×6 IMPLANT
SOLN 0.9% NACL POUR BTL 1000ML (IV SOLUTION) ×2 IMPLANT
SOLN STERILE WATER BTL 1000 ML (IV SOLUTION) ×2 IMPLANT
SYR 20ML ECCENTRIC (SYRINGE) ×2 IMPLANT
SYR 20ML LL LF (SYRINGE) ×2 IMPLANT
SYR 50ML SLIP (SYRINGE) ×2 IMPLANT
TOWEL GREEN STERILE FF (TOWEL DISPOSABLE) ×2 IMPLANT
TRAP SPECIMEN MUCUS 40CC (MISCELLANEOUS) IMPLANT
TUBE CONNECTING 20X1/4 (TUBING) ×2 IMPLANT
UNDERPAD 30X36 HEAVY ABSORB (UNDERPADS AND DIAPERS) ×2 IMPLANT
VALVE BIOPSY SINGLE USE (MISCELLANEOUS) ×2 IMPLANT
VALVE SUCTION BRONCHIO DISP (MISCELLANEOUS) ×2 IMPLANT
fiducial IMPLANT

## 2024-01-09 NOTE — Procedures (Signed)
 Insertion of Chest Tube Procedure Note  Tracy Williams  [id number]  [birth year]  Date:01/09/24  Time:12:59 PM    Provider Performing: Lamar GORMAN [name]   Procedure: Pleural Catheter Insertion w/o Imaging Guidance ([id number])  Indication(s) Pneumothorax  Consent Unable to obtain consent due to emergent nature of procedure.  Anesthesia Topical only with 1% lidocaine   The patient was under general anesthesia and continuous monitoring post-bronchoscopy   Time Out Verified patient identification, verified procedure, site/side was marked, verified correct patient position, special equipment/implants available, medications/allergies/relevant history reviewed, required imaging and test results available.   Sterile Technique Maximal sterile technique including full sterile barrier drape, hand hygiene, sterile gown, sterile gloves, mask, hair covering, sterile ultrasound probe cover (if used).   Procedure Description Ultrasound not used to identify appropriate pleural anatomy for placement and overlying skin marked. Area of placement cleaned and draped in sterile fashion.  A 14 French pigtail pleural catheter was placed into the right pleural space using Seldinger technique. Appropriate return of air was obtained.  The tube was connected to atrium and placed on -20 cm H2O wall suction.   Complications/Tolerance None; patient tolerated the procedure well. Chest X-ray is ordered to verify placement.   EBL None  Specimen(s) none   Lamar [name], MD, PhD 01/09/2024, 1:00 PM [facility] [phone] or if no answer before 7:00PM call [phone] For any issues after 7:00PM please call eLink [phone]

## 2024-01-09 NOTE — Op Note (Signed)
 Video Bronchoscopy with Robotic Assisted Bronchoscopic Navigation   Date of Operation: 01/09/2024   Pre-op Diagnosis: Right upper lobe pleural-based nodule  Post-op Diagnosis: Same  Surgeon: Lamar [name]  Assistants: None  Anesthesia: General endotracheal anesthesia  Operation: Flexible video fiberoptic bronchoscopy with robotic assistance and biopsies.  Estimated Blood Loss: Minimal  Complications: Right pneumothorax  Indications and History: Tracy Williams is a 78 y.o. female with history of Right upper lobe pulmonary nodule found in the setting of suspected pneumonia.  There was a cavitary component. Recommendation made to achieve a tissue diagnosis via robotic assisted navigational bronchoscopy.  The risks, benefits, complications, treatment options and expected outcomes were discussed with the patient.  The possibilities of pneumothorax, pneumonia, reaction to medication, pulmonary aspiration, perforation of a viscus, bleeding, failure to diagnose a condition and creating a complication requiring transfusion or operation were discussed with the patient who freely signed the consent.    Description of Procedure: The patient was seen in the Preoperative Area, was examined and was deemed appropriate to proceed.  The patient was taken to [facility] Endoscopy room 3, identified as Tracy Williams and the procedure verified as Flexible Video Fiberoptic Bronchoscopy.  A Time Out was held and the above information confirmed.   Prior to the date of the procedure a high-resolution CT scan of the chest was performed. Utilizing ION software program a virtual tracheobronchial tree was generated to allow the creation of distinct navigation pathways to the patient's parenchymal abnormalities. After being taken to the operating room general anesthesia was initiated and the patient  was orally intubated. The video fiberoptic bronchoscope was introduced via the endotracheal tube and a general  inspection was performed which showed normal right and left lung anatomy. Aspiration of the bilateral mainstems was completed to remove any remaining secretions. Robotic catheter inserted into patient's endotracheal tube.   Target #1 right upper lobe peripheral pulmonary nodule: The distinct navigation pathways prepared prior to this procedure were then utilized to navigate to patient's lesion identified on CT scan. The robotic catheter was secured into place and the vision probe was withdrawn.  Lesion location was approximated using fluoroscopy.  Local registration and targeting was performed using [employer] Cios mobile C-arm three-dimensional imaging. Under fluoroscopic guidance transbronchial brushing and transbronchial needle biopsies were performed to be sent for cytology and pathology.  Needle-in-lesion was confirmed using Cios mobile C-arm.  A bronchioalveolar lavage was performed in the right upper lobe and sent for microbiology.  Under fluoroscopic guidance a single fiducial marker was placed adjacent to the nodule.  Fluoroscopy at the end the procedure suggested a possible right pneumothorax.  Stat portable chest x-ray was performed in the room and pneumothorax was confirmed.  A right pigtail chest tube was placed under sterile conditions.  Please refer also to that procedure note.  At the end of the procedure a general airway inspection was performed and there was no evidence of active bleeding. The bronchoscope was removed.    Samples Target #1: 1. Transbronchial brushings from right upper lobe nodule 2. Transbronchial Wang needle biopsies from right upper lobe nodule 3. Bronchoalveolar lavage from right upper lobe    Plans:  The patient will be admitted to the hospital from the PACU after she is recovered with general anesthesia.  We will follow the cytology and microbiology results when they become available   Lamar [name], MD, PhD 01/09/2024, 12:18 PM [facility] [phone] or if no answer before 7:00PM call [phone] For  any issues after 7:00PM please call eLink [phone]

## 2024-01-09 NOTE — Plan of Care (Signed)

## 2024-01-09 NOTE — Interval H&P Note (Signed)
 History and Physical Interval Note:  01/09/2024 9:25 AM  Tracy Williams  has presented today for surgery, with the diagnosis of lung nodule.  The various methods of treatment have been discussed with the patient and family. After consideration of risks, benefits and other options for treatment, the patient has consented to  Procedure(s): VIDEO BRONCHOSCOPY WITH ENDOBRONCHIAL NAVIGATION (N/A) as a surgical intervention.  The patient's history has been reviewed, patient examined, no change in status, stable for surgery.  She reports that she had a fall on 10/19 while hiking, fell on her right knee and right hand.  No loss of consciousness.  She did not hit her head.  She has some right costal margin pain that is new following the fall.  No dyspnea, no cough.  I have reviewed the patient's chart and labs.  Questions were answered to the patient's satisfaction.     Lamar GORMAN [name]

## 2024-01-09 NOTE — Anesthesia Procedure Notes (Signed)
 Procedure Name: Intubation Date/Time: 01/09/2024 11:09 AM  Performed by: Elby Raelene SAUNDERS, CRNAPre-anesthesia Checklist: Patient identified, Emergency Drugs available, Suction available and Patient being monitored Patient Re-evaluated:Patient Re-evaluated prior to induction Oxygen Delivery Method: Circle System Utilized Preoxygenation: Pre-oxygenation with 100% oxygen Induction Type: IV induction Ventilation: Mask ventilation without difficulty Laryngoscope Size: Glidescope and 3 (attempt with miller 2 usuccessful) Tube type: Oral Number of attempts: 2 Airway Equipment and Method: Stylet and Oral airway Placement Confirmation: ETT inserted through vocal cords under direct vision, positive ETCO2 and breath sounds checked- equal and bilateral Secured at: 22 cm Tube secured with: Tape Dental Injury: Teeth and Oropharynx as per pre-operative assessment  Difficulty Due To: Difficult Airway- due to anterior larynx

## 2024-01-09 NOTE — Transfer of Care (Signed)
 Immediate Anesthesia Transfer of Care Note  Patient: Tracy Williams  Procedure(s) Performed: VIDEO BRONCHOSCOPY WITH ENDOBRONCHIAL NAVIGATION CHEST TUBE INSERTION (Right)  Patient Location: PACU  Anesthesia Type:General  Level of Consciousness: awake, alert , and oriented  Airway & Oxygen Therapy: Patient Spontanous Breathing and Patient connected to nasal cannula oxygen  Post-op Assessment: Report given to RN and Post -op Vital signs reviewed and stable  Post vital signs: Reviewed and stable  Last Vitals:  Vitals Value Taken Time  BP 144/70 01/09/24 13:00  Temp    Pulse 72 01/09/24 13:02  Resp 18 01/09/24 13:02  SpO2 96 % 01/09/24 13:02  Vitals shown include unfiled device data.  Last Pain:  Vitals:   01/09/24 0914  TempSrc:   PainSc: 0-No pain      Patients Stated Pain Goal: 0 (01/09/24 0914)  Complications: No notable events documented.

## 2024-01-09 NOTE — Anesthesia Preprocedure Evaluation (Addendum)
 Anesthesia Evaluation  Patient identified by MRN, date of birth, ID band Patient awake    Reviewed: Allergy  & Precautions, NPO status , Patient's Chart, lab work & pertinent test results  History of Anesthesia Complications (+) PONV and history of anesthetic complications  Airway Mallampati: II  TM Distance: >3 FB Neck ROM: Full    Dental no notable dental hx. (+) Teeth Intact, Dental Advisory Given, Caps   Pulmonary pneumonia, resolved Pulmonary nodule   Pulmonary exam normal breath sounds clear to auscultation       Cardiovascular hypertension, Normal cardiovascular exam+ dysrhythmias  Rhythm:Regular Rate:Normal     Neuro/Psych negative neurological ROS  negative psych ROS   GI/Hepatic Neg liver ROS,GERD  ,,Hx/o pancreatic cyst S/P distal pancreatectomy   Endo/Other  diabetes, Well Controlled, Type 2, Oral Hypoglycemic Agents    Renal/GU Renal InsufficiencyRenal disease  negative genitourinary   Musculoskeletal  (+) Arthritis , Osteoarthritis,    Abdominal   Peds  Hematology  (+) Blood dyscrasia, anemia Hx/o angioedema   Anesthesia Other Findings   Reproductive/Obstetrics                              Anesthesia Physical Anesthesia Plan  ASA: 3  Anesthesia Plan: General   Post-op Pain Management: Minimal or no pain anticipated   Induction: Intravenous  PONV Risk Score and Plan: 4 or greater and Treatment may vary due to age or medical condition, TIVA, Propofol  infusion, Diphenhydramine , Ondansetron  and Dexamethasone   Airway Management Planned: Oral ETT  Additional Equipment: None  Intra-op Plan:   Post-operative Plan: Extubation in OR  Informed Consent: I have reviewed the patients History and Physical, chart, labs and discussed the procedure including the risks, benefits and alternatives for the proposed anesthesia with the patient or authorized representative who has  indicated his/her understanding and acceptance.     Dental advisory given  Plan Discussed with: CRNA and Anesthesiologist  Anesthesia Plan Comments:          Anesthesia Quick Evaluation

## 2024-01-09 NOTE — Anesthesia Postprocedure Evaluation (Signed)
 Anesthesia Post Note  Patient: Tracy Williams  Procedure(s) Performed: VIDEO BRONCHOSCOPY WITH ENDOBRONCHIAL NAVIGATION CHEST TUBE INSERTION (Right)     Patient location during evaluation: PACU Anesthesia Type: General Level of consciousness: awake and alert and oriented Pain management: pain level controlled Vital Signs Assessment: post-procedure vital signs reviewed and stable Respiratory status: spontaneous breathing, nonlabored ventilation and respiratory function stable Cardiovascular status: blood pressure returned to baseline and stable Postop Assessment: no apparent nausea or vomiting Anesthetic complications: no   No notable events documented.  Last Vitals:  Vitals:   01/09/24 1254 01/09/24 1300  BP: (!) 140/61 (!) 144/70  Pulse: 84 69  Resp: (!) 22 16  Temp: 36.6 C   SpO2: 98% 95%    Last Pain:  Vitals:   01/09/24 1329  TempSrc:   PainSc: 7    Pain Goal: Patients Stated Pain Goal: 0 (01/09/24 0914)                 Kirbie Stodghill A.

## 2024-01-09 NOTE — Progress Notes (Signed)
 eLink Physician-Brief Progress Note Patient Name: Tracy Williams DOB: Oct 10, 1945 MRN: [id number]   Date of Service  01/09/2024  HPI/Events of Note  Patient complaining of C-tube site pain, asking for alternative  pain med Toradol  helps but only last 2 hours  Patient is not in a video accessible room  eICU Interventions  Ordered Tramadol  25 mg PO q 6 prn x 2 doses     Intervention Category Intermediate Interventions: Pain - evaluation and management  Damien ONEIDA Grout 01/09/2024, 10:44 PM

## 2024-01-09 NOTE — Progress Notes (Signed)
 Patient arrived to 6N09 from PACU with a 1/10 pain located in her abdomen with c/o nausea. Patient A&O X4 on 1L nasal cannula. Chest tube site is clean and dry with dressing intact. Chest tube connected to suction per order. All needs met at this time, bed in lowest position and call light within reach.

## 2024-01-09 NOTE — H&P (Addendum)
 NAME:  Tracy Williams, MRN:  [id number], DOB:  20-Dec-1945, LOS: 0 ADMISSION DATE:  01/09/2024,  CHIEF COMPLAINT: Iatrogenic right pneumothorax  History of Present Illness:  78 year old woman, never smoker, with diabetes, hyperlipidemia, pneumonia in 08/2023.  Her imaging revealed a small peripheral right upper lobe cavitary pulmonary nodule.  There was some central lucency on serial CT scans.  She was asymptomatic.  She discussed possible watchful waiting versus biopsy and elected for bronchoscopy.  Robotic assisted navigational bronchoscopy was done on 01/09/2024 under general anesthesia.  Needle biopsies and brushings were obtained as well as a right upper lobe BAL.  The procedure was complicated by an iatrogenic right pneumothorax.  A right pigtail chest tube was placed while the patient was still under anesthesia.  She will be admitted for management of her chest to better pneumothorax.  Pertinent  Medical History   Past Medical History:  Diagnosis Date   Allergy     Anemia    hx   Angio-edema    Arthritis    Diabetes mellitus without complication (HCC)    Type II   Dysrhythmia    Hx: of palpitations in 2010 only   GERD (gastroesophageal reflux disease)    Hypertension    Pancreatic mass    /notes 02/25/2016   Pneumonia 2025   PONV (postoperative nausea and vomiting)    pt reports no PONV after biopsy on 12/24/15   Right knee DJD    Stress incontinence    Urinary frequency    Urinary urgency    Wears glasses      Significant Hospital Events: Including procedures, antibiotic start and stop dates in addition to other pertinent events   01/09/2024 robotic assisted navigational bronchoscopy, cytology >>  01/09/2024 right pigtail chest tube  Interim History / Subjective:  Complaining of pleuritic right sided pain  Objective    Blood pressure (!) 166/66, pulse 77, temperature 98 F (36.7 C), temperature source Oral, resp. rate 20, height 5' 5 (1.651 m), weight 70.3  kg, SpO2 94%.        Intake/Output Summary (Last 24 hours) at 01/09/2024 1301 Last data filed at 01/09/2024 1244 Gross per 24 hour  Intake 900 ml  Output 20 ml  Net 880 ml   Filed Weights   01/09/24 0854  Weight: 70.3 kg    Examination: General: Overweight elderly woman in no distress HENT: Oropharynx clear, pupils equal Lungs: Clear bilateral breath sounds.  Right pigtail chest tube in place with intermittent airleak on suction -20 cmH2O Cardiovascular: Regular, no murmur Abdomen: Soft, nondistended with positive bowel sounds Extremities: No edema Neuro: Awake, a bit sleepy postoperatively but answers questions, follows commands GU: Deferred  Resolved problem list   Assessment and Plan   Iatrogenic right pneumothorax - Pigtail in place and to suction -20 cmH2O - Follow chest x-ray a.m. 10/21.  Hopefully will be able to transition to waterseal soon - Pain control with Toradol , acetaminophen , fentanyl  if needed  Right upper lobe peripheral cavitary pulmonary nodule.  Has been stable on short interval serial imaging.  Now s/p navigational bronchoscopy with biopsies on 10/20 - Await cytology results, culture results from biopsies of BAL  Diabetes - Start carb modified diet - Will get her back on her usual Glucotrol  XL and Janumet  beginning 10/21 - Cover with sliding scale insulin  until we get her back on her oral regimen  Hyperlipidemia - Home atorvastatin  ordered   Labs   CBC: Recent Labs  Lab 01/09/24 0941  WBC 11.4*  HGB 11.9*  HCT 36.7  MCV 91.5  PLT 355    Basic Metabolic Panel: No results for input(s): NA, K, CL, CO2, GLUCOSE, BUN, CREATININE, CALCIUM , MG, PHOS in the last 168 hours. GFR: CrCl cannot be calculated (Patient's most recent lab result is older than the maximum 21 days allowed.). Recent Labs  Lab 01/09/24 0941  WBC 11.4*    Liver Function Tests: No results for input(s): AST, ALT, ALKPHOS, BILITOT, PROT,  ALBUMIN in the last 168 hours. No results for input(s): LIPASE, AMYLASE in the last 168 hours. No results for input(s): AMMONIA in the last 168 hours.  ABG No results found for: PHART, PCO2ART, PO2ART, HCO3, TCO2, ACIDBASEDEF, O2SAT   Coagulation Profile: No results for input(s): INR, PROTIME in the last 168 hours.  Cardiac Enzymes: No results for input(s): CKTOTAL, CKMB, CKMBINDEX, TROPONINI in the last 168 hours.  HbA1C: Hgb A1c MFr Bld  Date/Time Value Ref Range Status  12/15/2023 10:53 AM 7.9 (H) 4.6 - 6.5 % Final    Comment:    Glycemic Control Guidelines for People with Diabetes:Non Diabetic:  <6%Goal of Therapy: <7%Additional Action Suggested:  >8%   08/01/2023 02:42 PM 7.0 (H) 4.6 - 6.5 % Final    Comment:    Glycemic Control Guidelines for People with Diabetes:Non Diabetic:  <6%Goal of Therapy: <7%Additional Action Suggested:  >8%     CBG: Recent Labs  Lab 01/09/24 0908  GLUCAP 118*    Review of Systems:   Stable chest discomfort  Past Medical History:  She,  has a past medical history of Allergy , Anemia, Angio-edema, Arthritis, Diabetes mellitus without complication (HCC), Dysrhythmia, GERD (gastroesophageal reflux disease), Hypertension, Pancreatic mass, Pneumonia (2025), PONV (postoperative nausea and vomiting), Right knee DJD, Stress incontinence, Urinary frequency, Urinary urgency, and Wears glasses.   Surgical History:   Past Surgical History:  Procedure Laterality Date   CESAREAN SECTION  1975   COLONOSCOPY W/ BIOPSIES AND POLYPECTOMY     Hx: of in 2012   DILATION AND CURETTAGE OF UTERUS     Hx: of 1978   EUS N/A 12/24/2015   Procedure: UPPER ENDOSCOPIC ULTRASOUND (EUS) RADIAL;  Surgeon: Elsie Cree, MD;  Location: [facility];  Service: Endoscopy;  Laterality: N/A;   FINE NEEDLE ASPIRATION N/A 12/24/2015   Procedure: FINE NEEDLE ASPIRATION (FNA) RADIAL;  Surgeon: Elsie Cree, MD;  Location: [facility];   Service: Endoscopy;  Laterality: N/A;   JOINT REPLACEMENT     Hx: of left Knee 2010   LAPAROSCOPIC DISTAL PANCREATECTOMY  02/25/2016   Diagnostic laparoscopy, laparoscopic hand-assisted    LAPAROSCOPIC SPLENECTOMY N/A 02/25/2016   Procedure: LAPAROSCOPIC SPLENECTOMY;  Surgeon: Jina Nephew, MD;  Location: [facility] OR;  Service: General;  Laterality: N/A;   PANCREATECTOMY N/A 02/25/2016   Procedure: LAPAROSCOPIC DISTAL PANCREATECTOMY;  Surgeon: Jina Nephew, MD;  Location: [facility] OR;  Service: General;  Laterality: N/A;   SPLENECTOMY  02/25/2016   TOTAL KNEE ARTHROPLASTY  2010   TOTAL KNEE ARTHROPLASTY Right 06/26/2012   Dr Jane   TOTAL KNEE ARTHROPLASTY Right 06/26/2012   Procedure: TOTAL KNEE ARTHROPLASTY- right;  Surgeon: Lamar DELENA Jane, MD;  Location: [facility] OR;  Service: Orthopedics;  Laterality: Right;     Social History:   reports that she has never smoked. She has never used smokeless tobacco. She reports that she does not drink alcohol and does not use drugs.   Family History:  Her family history includes Breast cancer (age of onset: 68) in her mother; Cancer in her  maternal grandmother and mother; Cancer - Other in her mother; Diabetes in her father and paternal grandmother; Diabetes Mellitus II in her father; Heart disease in her maternal grandfather; Hypertension in her father.   Allergies Allergies  Allergen Reactions   Lisinopril  Swelling    Caused throat and tongue to swell (breathing was not affected, however)   Metformin  And Related Nausea And Vomiting   No [employer] Food Allergies Swelling    Chinese food    Darvocet [Propoxyphene N-Acetaminophen ] Nausea And Vomiting   Demerol [Meperidine] Nausea And Vomiting   Hydrochlorothiazide Nausea And Vomiting   Percocet [Oxycodone -Acetaminophen ] Nausea And Vomiting   Vicodin [Hydrocodone-Acetaminophen ] Nausea And Vomiting     Home Medications  Prior to Admission medications   Medication Sig Start Date End Date Taking? Authorizing  Provider  atorvastatin  (LIPITOR) 40 MG tablet TAKE 1 TABLET BY MOUTH EVERY DAY 12/19/23  Yes Tabori, Katherine E, MD  Cholecalciferol (VITAMIN D3) 2000 units TABS Take 2,000 Units by mouth daily.    Yes [provider]  glipiZIDE  (GLUCOTROL  XL) 5 MG 24 hr tablet TAKE 1 TABLET BY MOUTH EVERY DAY WITH BREAKFAST 12/23/23  Yes Tabori, Katherine E, MD  JANUMET  50-500 MG tablet TAKE 1 TABLET BY MOUTH TWICE A DAY WITH A MEAL 08/29/23  Yes Tabori, Katherine E, MD  ACCU-CHEK SOFTCLIX LANCETS lancets Use as instructed to test sugars 2-3 times daily. E11.9 05/17/18   Tabori, Katherine E, MD  Alcohol Swabs (B-D SINGLE USE SWABS REGULAR) PADS USE ONE SWAB EACH TIME SUGARS ARE TESTED AS DIRECTED 07/17/18   Tabori, Katherine E, MD  Blood Glucose Monitoring Suppl (ACCU-CHEK GUIDE) w/Device KIT 1 each by Does not apply route 3 (three) times daily. 05/17/18   Tabori, Katherine E, MD  glucose blood (ACCU-CHEK GUIDE) test strip Use as instructed 05/17/18   Tabori, Katherine E, MD  SHINGRIX injection  12/15/22   [provider]     Critical care time: NA     Lamar [name], MD, PhD 01/09/2024, 1:01 PM [facility] [phone] or if no answer before 7:00PM call [phone] For any issues after 7:00PM please call eLink [phone]

## 2024-01-10 ENCOUNTER — Inpatient Hospital Stay (HOSPITAL_COMMUNITY)

## 2024-01-10 DIAGNOSIS — J939 Pneumothorax, unspecified: Secondary | ICD-10-CM | POA: Diagnosis not present

## 2024-01-10 DIAGNOSIS — R918 Other nonspecific abnormal finding of lung field: Secondary | ICD-10-CM | POA: Diagnosis not present

## 2024-01-10 DIAGNOSIS — J984 Other disorders of lung: Secondary | ICD-10-CM | POA: Diagnosis not present

## 2024-01-10 DIAGNOSIS — Z4682 Encounter for fitting and adjustment of non-vascular catheter: Secondary | ICD-10-CM | POA: Diagnosis not present

## 2024-01-10 LAB — GLUCOSE, CAPILLARY
Glucose-Capillary: 245 mg/dL — ABNORMAL HIGH (ref 70–99)
Glucose-Capillary: 129 mg/dL — ABNORMAL HIGH (ref 70–99)
Glucose-Capillary: 128 mg/dL — ABNORMAL HIGH (ref 70–99)

## 2024-01-10 MED ORDER — IBUPROFEN 200 MG PO TABS: ORAL_TABLET | ORAL | Status: DC

## 2024-01-10 NOTE — Progress Notes (Signed)
 Mobility Specialist Progress Note:    01/10/24 1004  Mobility  Activity Ambulated with assistance (In hallway)  Level of Assistance Standby assist, set-up cues, supervision of patient - no hands on  Assistive Device None  Distance Ambulated (ft) 285 ft  Activity Response Tolerated well  Mobility Referral Yes  Mobility visit 1 Mobility  Mobility Specialist Start Time (ACUTE ONLY) 0919  Mobility Specialist Stop Time (ACUTE ONLY) 0932  Mobility Specialist Time Calculation (min) (ACUTE ONLY) 13 min   Received pt in bed and eager for mobility. No physical assistance required. Pt requested to use the BR. Pt c/o mild pain, otherwise tolerated well. Returned to room without fault. Left pt in bed. Personal belongings and call light within reach. All needs met.  Lavanda Pollack Mobility Specialist  Please contact via [employer] or  Rehab Office [phone]

## 2024-01-10 NOTE — Progress Notes (Addendum)
    January 10, 2024  Patient: Tracy Williams  Date of Birth: Sep 09, 1945  Date of Visit: 12/27/2023    To Whom It May Concern:  Sharan Mcenaney was seen and treated at [facility] on 10/20 thru 10/21. Shenay Torti  may return to work on or after November 4 .  Sincerely,    Maude FORBES Banner ACNP-BC [facility]

## 2024-01-10 NOTE — Discharge Instructions (Addendum)
 Follow these instructions at home: Lifestyle Do not use any products that contain nicotine or tobacco. These products include cigarettes, chewing tobacco, and vaping devices, such as e-cigarettes. If you need help quitting, ask your health care provider. Do not lift anything that is heavier than 5 lbs, or the limit that you are told, until your health care provider says that it is safe. Avoid activities that take a lot of effort (are strenuous) for as long as told by your health care provider. Return to your normal activities as told by your health care provider. Ask your health care provider what activities are safe for you. Do not fly in an airplane or scuba dive until your health care provider says it is okay.

## 2024-01-10 NOTE — Progress Notes (Signed)
 No PTX on CXR CT removed.  Occlusive dressing applied Plan Will repeat CXR If no issues home this evening

## 2024-01-10 NOTE — Progress Notes (Signed)
   01/10/24 1213  TOC Brief Assessment  Insurance and Status Reviewed  Patient has primary care physician Yes  Home environment has been reviewed independent  Prior level of function: independent  Prior/Current Home Services No current home services  Social Drivers of Health Review SDOH reviewed no interventions necessary  Readmission risk has been reviewed Yes  Transition of care needs no transition of care needs at this time    Transition of Care [facility]) Screening Note   Patient Details  Name: Tracy Williams Date of Birth: Apr 20, 1945   Transition of Care [facility]) CM/SW Contact:    Stephane Powell Jansky, RN Phone Number: 01/10/2024, 12:14 PM    Inpatient Care Management Team  has reviewed patient and no disposition needs identified. We will continue to monitor patient advancement through interdisciplinary progression rounds. If new patient transition needs arise, please place a TOC consult.

## 2024-01-10 NOTE — Discharge Summary (Signed)
 Physician Discharge Summary         Patient ID: Tracy Williams MRN: [id number] DOB/AGE: [birth year] 78 y.o.  Admit date: 01/09/2024 Discharge date: 01/10/2024  Discharge Diagnoses:    Active Hospital Problems   Diagnosis Date Noted   Pneumothorax after biopsy 01/09/2024    Priority: 1.   Lung nodule 12/26/2023    Priority: 2.   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      Discharge summary      78 year old woman, never smoker, with diabetes, hyperlipidemia, pneumonia in 08/2023. Her imaging revealed a small peripheral right upper lobe cavitary pulmonary nodule. There was some central lucency on serial CT scans. She was asymptomatic. She discussed possible watchful waiting versus biopsy and elected for bronchoscopy. Robotic assisted navigational bronchoscopy was done on 01/09/2024 under general anesthesia. Needle biopsies and brushings were obtained as well as a right upper lobe BAL. The procedure was complicated by an iatrogenic right pneumothorax. A right pigtail chest tube was placed while the patient was still under anesthesia.   Admitted to medical ward. CT placed to sxn, administered as needed analgesia. On 10/21 CXR stable apical ptx & there was no airleak. Chest tube was placed to water seal. Repeat CXR showed unchanged apical PTX (<5%); there was new volume loss noted at LLL. The chest tube was removed. She was monitored post removal w/ subsequent post-removal CXR also obtained and this demonstrated . She was cleared for discharge to home after this with instructions as listed below.    Discharge Plan by Active Problems    Iatrogenic right pneumothorax (resolved/trace) Plan F/u 10/28 w/ PCXR and to follow up path and cytology    Right upper lobe  peripheral cavitary pulmonary nodule.  Has been stable on short interval serial imaging.  Now s/p navigational bronchoscopy with biopsies on 10/20 Plan Follow up biopsies and cytology  Has follow up in our clinic on 28th to discuss results  Diabetes Plan Resume home regimen    Hyperlipidemia Plan Home atorvastatin  ordered   Discharge Exam: BP (!) 152/63 (BP Location: Left Arm)   Pulse 80   Temp 98.1 F (36.7 C) (Oral)   Resp 18   Ht 5' 5 (1.651 m)   Wt 70.3 kg   SpO2 93%   BMI 25.79 kg/m   General this is a 78 year old female who is sitting up at side of bed. She is in no distress HENT NCAT no JVD Pulm clear on room air Left chest dressing intact. No drainage. PCXR reviewed. Stable trace apical ptx. No change post removal  Card rrr Abd soft Ext warm  Neuro intact   Labs at discharge   Lab Results  Component Value Date   CREATININE 1.22 (H) 12/15/2023   BUN 26 (H) 12/15/2023   NA 138 12/15/2023   K 4.5 12/15/2023   CL 103 12/15/2023   CO2 27 12/15/2023   Lab Results  Component Value Date   WBC 11.4 (H) 01/09/2024   HGB 11.9 (L) 01/09/2024   HCT 36.7 01/09/2024   MCV 91.5 01/09/2024   PLT 355 01/09/2024   Lab Results  Component Value Date   ALT 11 08/01/2023   AST 14 08/01/2023   ALKPHOS 100 08/01/2023   BILITOT 0.7 08/01/2023   Lab Results  Component Value  Date   INR 1.10 02/19/2016   INR 0.98 06/22/2012   INR 2.0 (H) 10/09/2008    Current radiological studies    DG Chest Port 1 View Result Date: 01/10/2024 EXAM: 1 VIEW(S) XRAY OF THE CHEST 01/10/2024 05:07:00 PM COMPARISON: 01/10/2024 CLINICAL HISTORY: Pneumothorax [id number]. Post CT removal. ; Previous pneumothorax after biopsy. FINDINGS: LINES, TUBES AND DEVICES: Right chest tube removed. LUNGS AND PLEURA: Stable trace right apical pneumothorax. Stable right upper lobe nodule with adjacent fiducial marker. Stable left basilar opacity. No pulmonary edema. HEART AND MEDIASTINUM: No acute  abnormality of the cardiac and mediastinal silhouettes. BONES AND SOFT TISSUES: No acute osseous abnormality. IMPRESSION: 1. Stable trace right apical pneumothorax. 2. Stable left basilar opacity. Electronically signed by: Norman Gatlin MD 01/10/2024 05:23 PM EDT RP Workstation: [id number]   DG CHEST PORT 1 VIEW Result Date: 01/10/2024 EXAM: 1 VIEW(S) XRAY OF THE CHEST 01/10/2024 12:09:00 PM COMPARISON: 01/10/2024 CLINICAL HISTORY: Pneumothorax FINDINGS: LINES, TUBES AND DEVICES: Right pleural catheter stable in place. LUNGS AND PLEURA: Unchanged trace right apical pneumothorax. Similar left basilar opacities. Unchanged right upper lobe nodule with adjacent fiducial marker. No pulmonary edema. No pleural effusion. HEART AND MEDIASTINUM: No acute abnormality of the cardiac and mediastinal silhouettes. BONES AND SOFT TISSUES: No acute osseous abnormality. IMPRESSION: 1. Unchanged trace right apical pneumothorax. 2. Left base airspace disease, atelectasis versus early infection. Electronically signed by: Rockey Kilts MD 01/10/2024 04:08 PM EDT RP Workstation: [id number]   Portable chest 1 View Result Date: 01/10/2024 CLINICAL DATA:  Follow-up right pneumothorax and chest tube. EXAM: PORTABLE CHEST 1 VIEW COMPARISON:  01/09/2024 FINDINGS: Better visualized small right upper lung zone nodular density containing a single metallic clip. A right lateral basilar pleural catheter remains in place. Some of the sideholes appear to be outside of the thorax. No subcutaneous emphysema. Skin fold overlying the right lung apex with no definite residual pneumothorax. Mild right perihilar and left lower lobe atelectasis. Diffuse osteopenia. IMPRESSION: 1. No definite residual right pneumothorax. 2. Mild right perihilar and left lower lobe atelectasis. 3. A right lateral basilar pleural catheter remains in place. Some of the sideholes appear to be outside of the thorax. Electronically Signed   By: Elspeth Bathe M.D.   On:  01/10/2024 09:38   DG CHEST PORT 1 VIEW Result Date: 01/09/2024 EXAM: 1 VIEW(S) XRAY OF THE CHEST 01/09/2024 12:54:00 PM COMPARISON: 01/09/2024 at 12:10 pm. CLINICAL HISTORY: [id number] Chest tube in place [id number]. FINDINGS: LINES, TUBES AND DEVICES: ET tube tip is 1.9 cm above the carina. Interval placement of right-sided pigtail thoracostomy tube for right-sided pneumothorax. Biopsy clip within the right upper quadrant of the abdomen is again noted. LUNGS AND PLEURA: Significant decreased volume of the right pneumothorax with residual small apical component measuring around 5 mm in thickness. Mild atelectasis in the left base. No pulmonary edema. No pleural effusion. HEART AND MEDIASTINUM: Aortic atherosclerotic calcifications. No acute abnormality of the cardiac and mediastinal silhouettes. BONES AND SOFT TISSUES: No acute osseous abnormality. IMPRESSION: 1. Interval placement of a right-sided pigtail thoracostomy tube for treatment of right pneumothorax. 2. Marked interval decrease of right pneumothorax with a small residual apical component measuring approximately 5 mm in thickness. Electronically signed by: Waddell Calk MD 01/09/2024 02:38 PM EDT RP Workstation: [id number]   DG CHEST PORT 1 VIEW Result Date: 01/09/2024 EXAM: 1 VIEW(S) XRAY OF THE CHEST 01/09/2024 12:15:43 PM COMPARISON: 07/05/2023 CLINICAL HISTORY: S/P bronchoscopy. FINDINGS: LINES, TUBES AND DEVICES: The endotracheal tube tip is 2.5 cm above the  carina. LUNGS AND PLEURA: A new biopsy clip is noted within the right upper lobe in the area of the previous lung nodule. There is a moderate volume right-sided pneumothorax. Over the lateral right upper lobe, this measures 2.6 cm in thickness. Along the right costophrenic angle, the neumothorax measures 1.8 cm in thickness. No pulmonary edema. No pleural effusion. HEART AND MEDIASTINUM: Leftward shift of the mediastinum, which may reflect a small tension component versus rotational artifact.  BONES AND SOFT TISSUES: Left upper quadrant surgical clips noted. No acute osseous abnormality. IMPRESSION: 1. Moderate right pneumothorax, with measurements up to 2.6 cm laterally and 1.8 cm at the right costophrenic angle. 2. Leftward mediastinal shift with left lung volume loss, suggestive of a possible small tension component versus rotational artifact. Electronically signed by: Waddell Calk MD 01/09/2024 02:03 PM EDT RP Workstation: [id number]   DG C-ARM BRONCHOSCOPY Result Date: 01/09/2024 C-ARM BRONCHOSCOPY: Fluoroscopy was utilized by the requesting physician.  No radiographic interpretation.   DG C-Arm 1-60 Min-No Report Result Date: 01/09/2024 Fluoroscopy was utilized by the requesting physician.  No radiographic interpretation.    Disposition:    Discharge disposition: 01-Home or Self Care       Discharge Instructions     Call MD for:   Complete by: As directed    Fever, cough drainage from chest tube site  Go to ER for chest pain or shortness of breath new from today   Call MD for:  temperature >100.4   Complete by: As directed    Diet - low sodium heart healthy   Complete by: As directed    Discharge instructions   Complete by: As directed    No lifting > 5 lbs for 2 weeks OK to shower Place regular bandage on site after for 2 d. Then can reassess daily  Can return to work after Nov 4   Increase activity slowly   Complete by: As directed        Allergies as of 01/10/2024       Reactions   Lisinopril  Swelling   Caused throat and tongue to swell (breathing was not affected, however)   Metformin  And Related Nausea And Vomiting   No [employer] Food Allergies Swelling   Chinese food    Darvocet [propoxyphene N-acetaminophen ] Nausea And Vomiting   Demerol [meperidine] Nausea And Vomiting   Hydrochlorothiazide Nausea And Vomiting   Percocet [oxycodone -acetaminophen ] Nausea And Vomiting   Vicodin [hydrocodone-acetaminophen ] Nausea And Vomiting         Medication List     TAKE these medications    atorvastatin  40 MG tablet Commonly known as: LIPITOR TAKE 1 TABLET BY MOUTH EVERY DAY   glipiZIDE  5 MG 24 hr tablet Commonly known as: GLUCOTROL  XL TAKE 1 TABLET BY MOUTH EVERY DAY WITH BREAKFAST   HAIR SKIN & NAILS ADVANCED PO Take 1 tablet by mouth daily.   ibuprofen  200 MG tablet Commonly known as: ADVIL  Take 3-4 tablets (600-800mg  every 8 hrs as needed)   Janumet  50-500 MG tablet Generic drug: sitaGLIPtin -metformin  TAKE 1 TABLET BY MOUTH TWICE A DAY WITH A MEAL What changed: See the new instructions.   Vitamin D3 50 MCG (2000 UT) Tabs Take 2,000 Units by mouth daily.         Follow-up appointment   28th w/ GORMAN Lites  Discharge Condition:    good  Physician Statement:   The Patient was personally examined, the discharge assessment and plan has been personally reviewed and I agree with ACNP Kalyb Pemble's  assessment and plan. 36 minutes of time have been dedicated to discharge assessment, planning and discharge instructions.   Signed: Jeralyn FORBES Banner 01/10/2024, 6:59 PM

## 2024-01-10 NOTE — Progress Notes (Signed)
 NAME:  Tracy Williams, MRN:  [id number], DOB:  Aug 21, 1945, LOS: 1 ADMISSION DATE:  01/09/2024,  CHIEF COMPLAINT: Iatrogenic right pneumothorax  History of Present Illness:  78 year old woman, never smoker, with diabetes, hyperlipidemia, pneumonia in 08/2023.  Her imaging revealed a small peripheral right upper lobe cavitary pulmonary nodule.  There was some central lucency on serial CT scans.  She was asymptomatic.  She discussed possible watchful waiting versus biopsy and elected for bronchoscopy.  Robotic assisted navigational bronchoscopy was done on 01/09/2024 under general anesthesia.  Needle biopsies and brushings were obtained as well as a right upper lobe BAL.  The procedure was complicated by an iatrogenic right pneumothorax.  A right pigtail chest tube was placed while the patient was still under anesthesia.  She will be admitted for management of her chest to better pneumothorax.  Pertinent  Medical History   Past Medical History:  Diagnosis Date   Allergy     Anemia    hx   Angio-edema    Arthritis    Diabetes mellitus without complication (HCC)    Type II   Dysrhythmia    Hx: of palpitations in 2010 only   GERD (gastroesophageal reflux disease)    Hypertension    Pancreatic mass    /notes 02/25/2016   Pneumonia 2025   PONV (postoperative nausea and vomiting)    pt reports no PONV after biopsy on 12/24/15   Right knee DJD    Stress incontinence    Urinary frequency    Urinary urgency    Wears glasses      Significant Hospital Events: Including procedures, antibiotic start and stop dates in addition to other pertinent events   01/09/2024 robotic assisted navigational bronchoscopy, cytology >>  01/09/2024 right pigtail chest tube  Interim History / Subjective:   No complaints  Objective    Blood pressure (!) 152/63, pulse 80, temperature 98.1 F (36.7 C), temperature source Oral, resp. rate 18, height 5' 5 (1.651 m), weight 70.3 kg, SpO2 93%.         Intake/Output Summary (Last 24 hours) at 01/10/2024 1003 Last data filed at 01/09/2024 1500 Gross per 24 hour  Intake 1020 ml  Output 20 ml  Net 1000 ml   Filed Weights   01/09/24 0854  Weight: 70.3 kg    Examination: Gen:      No acute distress HEENT:  EOMI, sclera anicteric Neck:     No masses; no thyromegaly Lungs:    Clear to auscultation bilaterally; normal respiratory effort CV:         Regular rate and rhythm; no murmurs Abd:      + bowel sounds; soft, non-tender; no palpable masses, no distension Ext:    No edema; adequate peripheral perfusion Neuro: alert and oriented x 3 Psych: normal mood and affect   CXR with resolution of pneumothorax  Resolved problem list   Assessment and Plan   Iatrogenic right pneumothorax - Pigtail in place. No air leak noted and CXR with resolution of Ptx - Place to water seal. If follow up CXR looks of then chest tube can come out. - Pain control with Toradol , acetaminophen , fentanyl  if needed  Right upper lobe peripheral cavitary pulmonary nodule.  Has been stable on short interval serial imaging.  Now s/p navigational bronchoscopy with biopsies on 10/20 - Await cytology results, culture results from biopsies of BAL  Diabetes - Start carb modified diet - Will get her back on her usual Glucotrol  XL and Janumet  beginning  10/21 - Cover with sliding scale insulin  until we get her back on her oral regimen  Hyperlipidemia - Home atorvastatin  ordered  Signature:   Kaeya Schiffer MD [facility] See Amion for pager  If no response to pager , please call [phone] until 7pm After 7:00 pm call Elink  [phone] 01/10/2024, 10:04 AM

## 2024-01-10 NOTE — Plan of Care (Signed)

## 2024-01-11 ENCOUNTER — Telehealth: Payer: Self-pay | Admitting: *Deleted

## 2024-01-11 ENCOUNTER — Encounter (HOSPITAL_COMMUNITY): Payer: Self-pay | Admitting: Emergency Medicine

## 2024-01-11 LAB — ACID FAST SMEAR (AFB, MYCOBACTERIA): Acid Fast Smear: NEGATIVE

## 2024-01-11 NOTE — Transitions of Care (Post Inpatient/ED Visit) (Addendum)
 01/11/2024  Name: Tracy Williams MRN: [id number] DOB: 1945/07/13  Today's TOC FU Call Status: Today's TOC FU Call Status:: Successful TOC FU Call Completed TOC FU Call Complete Date: 01/11/24 Patient's Name and Date of Birth confirmed.  Transition Care Management Follow-up Telephone Call Date of Discharge: 01/10/24 Discharge Facility: [facility] Type of Discharge: Inpatient Admission Primary Inpatient Discharge Diagnosis:: Pneumothorax after biopsy How have you been since you were released from the hospital?: Better Any questions or concerns?: No  Items Reviewed: Did you receive and understand the discharge instructions provided?: Yes Medications obtained,verified, and reconciled?: Yes (Medications Reviewed) Any new allergies since your discharge?: No Dietary orders reviewed?: No Do you have support at home?: Yes People in Home [RPT]: alone Name of Support/Comfort Primary Source: Ed friend  Medications Reviewed Today: Medications Reviewed Today     Reviewed by Kennieth Cathlean DEL, RN (Case Manager) on 01/11/24 at 1052  Med List Status: <None>   Medication Order Taking? Sig Documenting Provider Last Dose Status Informant  atorvastatin  (LIPITOR) 40 MG tablet [id number] Yes TAKE 1 TABLET BY MOUTH EVERY DAY Tabori, Katherine E, MD  Active Self, Pharmacy Records  Cholecalciferol (VITAMIN D3) 2000 units TABS [id number] Yes Take 2,000 Units by mouth daily.  [provider]  Active Self, Pharmacy Records  glipiZIDE  (GLUCOTROL  XL) 5 MG 24 hr tablet [id number] Yes TAKE 1 TABLET BY MOUTH EVERY DAY WITH BREAKFAST Tabori, Katherine E, MD  Active Self, Pharmacy Records  ibuprofen  (ADVIL ) 200 MG tablet [id number] Yes Take 3-4 tablets (600-800mg  every 8 hrs as needed) Jenna Maude BRAVO, NP  Active   JANUMET  50-500 MG tablet [id number] Yes TAKE 1 TABLET BY MOUTH TWICE A DAY WITH A MEAL Tabori, Katherine E, MD  Active Self, Pharmacy Records  Multiple Vitamins-Minerals [facility]  SKIN & NAILS ADVANCED PO) [id number] Yes Take 1 tablet by mouth daily. [provider]  Active Self, Pharmacy Records            Home Care and Equipment/Supplies: Were Home Health Services Ordered?: NA Any new equipment or medical supplies ordered?: NA  Functional Questionnaire: Do you need assistance with bathing/showering or dressing?: No Do you need assistance with meal preparation?: No Do you need assistance with eating?: No Do you have difficulty maintaining continence: No Do you need assistance with getting out of bed/getting out of a chair/moving?: No Do you have difficulty managing or taking your medications?: No  Follow up appointments reviewed: PCP Follow-up appointment confirmed?: Yes Date of PCP follow-up appointment?: 01/16/24 Follow-up Provider: Mahlon [facility] Follow-up appointment confirmed?: Yes Date of Specialist follow-up appointment?: 01/17/24 Follow-Up Specialty Provider:: Lauraine Lites Do you need transportation to your follow-up appointment?: No Do you understand care options if your condition(s) worsen?: Yes-patient verbalized understanding  SDOH Interventions Today    Flowsheet Row Most Recent Value  SDOH Interventions   Food Insecurity Interventions Intervention Not Indicated  Housing Interventions Intervention Not Indicated  Transportation Interventions Patient Resources (Friends/Family), Intervention Not Indicated  Utilities Interventions Intervention Not Indicated  RN discussed activity restrictions Do not lift anything that is heavier than 5 lbs,  Avoid activities that take a lot of effort (are strenuous)  Do not fly in an airplane   Discussed and offered 30 day TOC program.  Patient declined.  The patient has been provided with contact information for the care management team and has been advised to call with any health -related questions or concerns.  The patient verbalized understanding with current plan of care.  The patient is directed to their insurance card regarding availability of benefits coverage  Cathlean Headland BSN RN [facility] Care Management Coordinator [email or web address] Direct Dial: [phone]  Fax: [phone] Website: [email or web address]

## 2024-01-13 LAB — CYTOLOGY - NON PAP

## 2024-01-14 LAB — AEROBIC/ANAEROBIC CULTURE W GRAM STAIN (SURGICAL/DEEP WOUND)
Culture: NO GROWTH
Gram Stain: NONE SEEN

## 2024-01-16 ENCOUNTER — Encounter: Payer: Self-pay | Admitting: Family Medicine

## 2024-01-16 ENCOUNTER — Inpatient Hospital Stay: Admitting: Family Medicine

## 2024-01-17 ENCOUNTER — Ambulatory Visit: Admitting: Acute Care

## 2024-01-17 ENCOUNTER — Telehealth: Payer: Self-pay

## 2024-01-17 NOTE — Telephone Encounter (Signed)
 Copied from CRM [id number]. Topic: General - Call Back - No Documentation >> Jan 17, 2024  9:27 AM Rea BROCKS wrote: Reason for CRM: Patient missed her appointment yesterday with Dr. Mahlon by accident. She mixed it up with her pulmonary appointment that's actually today.  Patient is asking if Dr. Mahlon would still like to see her after she follows up with her Pulmonary doctor today. If so, patient will r/s her appt then but she would like a response back from Dr. Mahlon to know if she still needs to follow up with her.   [phone] (M)

## 2024-01-17 NOTE — Telephone Encounter (Signed)
 Called patient and she has no concerns. She is again very sorry for missing her appt

## 2024-01-17 NOTE — Telephone Encounter (Signed)
 I'll read the pulmonary note from today and see what they have to say, but I don't think I would need to see her unless she has a concern or something to discuss

## 2024-01-17 NOTE — Telephone Encounter (Signed)
**Note De-identified  [place] Obfuscation** Please advise 

## 2024-01-19 ENCOUNTER — Ambulatory Visit: Payer: Self-pay | Admitting: Pulmonary Disease

## 2024-01-19 ENCOUNTER — Ambulatory Visit (INDEPENDENT_AMBULATORY_CARE_PROVIDER_SITE_OTHER)

## 2024-01-19 ENCOUNTER — Ambulatory Visit (INDEPENDENT_AMBULATORY_CARE_PROVIDER_SITE_OTHER): Admitting: Pulmonary Disease

## 2024-01-19 ENCOUNTER — Encounter: Payer: Self-pay | Admitting: Pulmonary Disease

## 2024-01-19 VITALS — BP 138/60 | HR 74 | Ht 65.0 in | Wt 157.4 lb

## 2024-01-19 DIAGNOSIS — R918 Other nonspecific abnormal finding of lung field: Secondary | ICD-10-CM | POA: Diagnosis not present

## 2024-01-19 DIAGNOSIS — R911 Solitary pulmonary nodule: Secondary | ICD-10-CM | POA: Diagnosis not present

## 2024-01-19 DIAGNOSIS — J95811 Postprocedural pneumothorax: Secondary | ICD-10-CM | POA: Diagnosis not present

## 2024-01-19 DIAGNOSIS — J984 Other disorders of lung: Secondary | ICD-10-CM | POA: Diagnosis not present

## 2024-01-19 DIAGNOSIS — J939 Pneumothorax, unspecified: Secondary | ICD-10-CM | POA: Diagnosis not present

## 2024-01-19 NOTE — Progress Notes (Signed)
 @Patient  ID: Tracy Williams, female    DOB: [birth year], 78 y.o.   MRN: [id number]  Chief Complaint  Patient presents with   Medical Management of Chronic Issues    Referring provider: Mahlon Comer BRAVO, MD  HPI:   78 y.o. woman whom are seeing for evaluation of lung nodule.  Multiple hospital notes including H&P and discharge summary reviewed.  Biopsy of approximate 1 cm right upper lobe nodule.  Unfortunately, results of biopsy are nondiagnostic.  No malignant cells but no other pathologic biopsy on review of cytology.  Unfortunately, she had postprocedural pneumothorax.  Review of images demonstrate serial pneumothorax with improvement with chest tube on my review and interpretation. Discharged and doing well.  Overall stable.  Eager to get back to work.  We discussed additional surveillance of lung nodule.    HPI initial visit: Patient was in normal state of health.  Presented to PCP office 06/2023 with cough body aches etc.  Concern for community-acquired pneumonia versus viral infection.  Chest x-ray revealed a 8 to 9 mm right sided nodule.  This is followed up by CT scan 08/2023.  Read 09/2023.  This has a peripheral right upper lobe nodule with a central lucency or clearing unclear if cavitating versus dilated airway in that nodule on my review and interpretation.  She improved clinically.  Is here for follow-up of that CT scan.  No shortness of breath.  No cough.  Never smoker.  Mother had breast cancer metastasized to the lungs.  Husband had lung cancer and passed from this many years ago.    Questionaires / Pulmonary Flowsheets:   ACT:      No data to display          MMRC:     No data to display          Epworth:      No data to display          Tests:   FENO:  No results found for: NITRICOXIDE  PFT:     No data to display          WALK:      No data to display          Imaging: Personally viewed and as per EMR and discussion  this note DG Chest Port 1 View Result Date: 01/10/2024 EXAM: 1 VIEW(S) XRAY OF THE CHEST 01/10/2024 05:07:00 PM COMPARISON: 01/10/2024 CLINICAL HISTORY: Pneumothorax [id number]. Post CT removal. ; Previous pneumothorax after biopsy. FINDINGS: LINES, TUBES AND DEVICES: Right chest tube removed. LUNGS AND PLEURA: Stable trace right apical pneumothorax. Stable right upper lobe nodule with adjacent fiducial marker. Stable left basilar opacity. No pulmonary edema. HEART AND MEDIASTINUM: No acute abnormality of the cardiac and mediastinal silhouettes. BONES AND SOFT TISSUES: No acute osseous abnormality. IMPRESSION: 1. Stable trace right apical pneumothorax. 2. Stable left basilar opacity. Electronically signed by: Norman Gatlin MD 01/10/2024 05:23 PM EDT RP Workstation: [id number]   DG CHEST PORT 1 VIEW Result Date: 01/10/2024 EXAM: 1 VIEW(S) XRAY OF THE CHEST 01/10/2024 12:09:00 PM COMPARISON: 01/10/2024 CLINICAL HISTORY: Pneumothorax FINDINGS: LINES, TUBES AND DEVICES: Right pleural catheter stable in place. LUNGS AND PLEURA: Unchanged trace right apical pneumothorax. Similar left basilar opacities. Unchanged right upper lobe nodule with adjacent fiducial marker. No pulmonary edema. No pleural effusion. HEART AND MEDIASTINUM: No acute abnormality of the cardiac and mediastinal silhouettes. BONES AND SOFT TISSUES: No acute osseous abnormality. IMPRESSION: 1. Unchanged trace right apical pneumothorax. 2. Left base airspace  disease, atelectasis versus early infection. Electronically signed by: Rockey Kilts MD 01/10/2024 04:08 PM EDT RP Workstation: [id number]   Portable chest 1 View Result Date: 01/10/2024 CLINICAL DATA:  Follow-up right pneumothorax and chest tube. EXAM: PORTABLE CHEST 1 VIEW COMPARISON:  01/09/2024 FINDINGS: Better visualized small right upper lung zone nodular density containing a single metallic clip. A right lateral basilar pleural catheter remains in place. Some of the sideholes appear to be  outside of the thorax. No subcutaneous emphysema. Skin fold overlying the right lung apex with no definite residual pneumothorax. Mild right perihilar and left lower lobe atelectasis. Diffuse osteopenia. IMPRESSION: 1. No definite residual right pneumothorax. 2. Mild right perihilar and left lower lobe atelectasis. 3. A right lateral basilar pleural catheter remains in place. Some of the sideholes appear to be outside of the thorax. Electronically Signed   By: Elspeth Bathe M.D.   On: 01/10/2024 09:38   DG CHEST PORT 1 VIEW Result Date: 01/09/2024 EXAM: 1 VIEW(S) XRAY OF THE CHEST 01/09/2024 12:54:00 PM COMPARISON: 01/09/2024 at 12:10 pm. CLINICAL HISTORY: [id number] Chest tube in place [id number]. FINDINGS: LINES, TUBES AND DEVICES: ET tube tip is 1.9 cm above the carina. Interval placement of right-sided pigtail thoracostomy tube for right-sided pneumothorax. Biopsy clip within the right upper quadrant of the abdomen is again noted. LUNGS AND PLEURA: Significant decreased volume of the right pneumothorax with residual small apical component measuring around 5 mm in thickness. Mild atelectasis in the left base. No pulmonary edema. No pleural effusion. HEART AND MEDIASTINUM: Aortic atherosclerotic calcifications. No acute abnormality of the cardiac and mediastinal silhouettes. BONES AND SOFT TISSUES: No acute osseous abnormality. IMPRESSION: 1. Interval placement of a right-sided pigtail thoracostomy tube for treatment of right pneumothorax. 2. Marked interval decrease of right pneumothorax with a small residual apical component measuring approximately 5 mm in thickness. Electronically signed by: Waddell Calk MD 01/09/2024 02:38 PM EDT RP Workstation: [id number]   DG CHEST PORT 1 VIEW Result Date: 01/09/2024 EXAM: 1 VIEW(S) XRAY OF THE CHEST 01/09/2024 12:15:43 PM COMPARISON: 07/05/2023 CLINICAL HISTORY: S/P bronchoscopy. FINDINGS: LINES, TUBES AND DEVICES: The endotracheal tube tip is 2.5 cm above the carina.  LUNGS AND PLEURA: A new biopsy clip is noted within the right upper lobe in the area of the previous lung nodule. There is a moderate volume right-sided pneumothorax. Over the lateral right upper lobe, this measures 2.6 cm in thickness. Along the right costophrenic angle, the neumothorax measures 1.8 cm in thickness. No pulmonary edema. No pleural effusion. HEART AND MEDIASTINUM: Leftward shift of the mediastinum, which may reflect a small tension component versus rotational artifact. BONES AND SOFT TISSUES: Left upper quadrant surgical clips noted. No acute osseous abnormality. IMPRESSION: 1. Moderate right pneumothorax, with measurements up to 2.6 cm laterally and 1.8 cm at the right costophrenic angle. 2. Leftward mediastinal shift with left lung volume loss, suggestive of a possible small tension component versus rotational artifact. Electronically signed by: Waddell Calk MD 01/09/2024 02:03 PM EDT RP Workstation: [id number]   DG C-ARM BRONCHOSCOPY Result Date: 01/09/2024 C-ARM BRONCHOSCOPY: Fluoroscopy was utilized by the requesting physician.  No radiographic interpretation.   DG C-Arm 1-60 Min-No Report Result Date: 01/09/2024 Fluoroscopy was utilized by the requesting physician.  No radiographic interpretation.    Lab Results: Personally reviewed CBC    Component Value Date/Time   WBC 11.4 (H) 01/09/2024 0941   RBC 4.01 01/09/2024 0941   HGB 11.9 (L) 01/09/2024 0941   HGB 12.9 12/04/2018 0955   HCT 36.7  01/09/2024 0941   HCT 39.8 12/04/2018 0955   PLT 355 01/09/2024 0941   PLT 381 12/04/2018 0955   MCV 91.5 01/09/2024 0941   MCV 91 12/04/2018 0955   MCH 29.7 01/09/2024 0941   MCHC 32.4 01/09/2024 0941   RDW 14.2 01/09/2024 0941   RDW 13.5 12/04/2018 0955   LYMPHSABS 2.3 08/01/2023 1442   LYMPHSABS 1.5 12/04/2018 0955   MONOABS 1.2 (H) 08/01/2023 1442   EOSABS 0.4 08/01/2023 1442   EOSABS 0.2 12/04/2018 0955   BASOSABS 0.1 08/01/2023 1442   BASOSABS 0.1 12/04/2018 0955     BMET    Component Value Date/Time   NA 138 12/15/2023 1053   NA 141 12/04/2018 0955   K 4.5 12/15/2023 1053   CL 103 12/15/2023 1053   CO2 27 12/15/2023 1053   GLUCOSE 114 (H) 12/15/2023 1053   BUN 26 (H) 12/15/2023 1053   BUN 25 12/04/2018 0955   CREATININE 1.22 (H) 12/15/2023 1053   CALCIUM  9.2 12/15/2023 1053   GFRNONAA 44 (L) 12/04/2018 0955   GFRAA 51 (L) 12/04/2018 0955    BNP No results found for: BNP  ProBNP    Component Value Date/Time   PROBNP 30.0 08/14/2008 2330    Specialty Problems       Pulmonary Problems   Perennial and seasonal allergic rhinitis   Community acquired pneumonia   Lung nodule   Pneumothorax after biopsy    Allergies  Allergen Reactions   Lisinopril  Swelling    Caused throat and tongue to swell (breathing was not affected, however)   Metformin  And Related Nausea And Vomiting   No Healthtouch Food Allergies Swelling    Chinese food    Darvocet [Propoxyphene N-Acetaminophen ] Nausea And Vomiting   Demerol [Meperidine] Nausea And Vomiting   Hydrochlorothiazide Nausea And Vomiting   Percocet [Oxycodone -Acetaminophen ] Nausea And Vomiting   Vicodin [Hydrocodone-Acetaminophen ] Nausea And Vomiting    Immunization History  Administered Date(s) Administered   Fluad Quad(high Dose 65+) 01/03/2019, 12/31/2019   Fluad Trivalent(High Dose 65+) 12/15/2023   HIB (PRP-T) 01/29/2016   INFLUENZA, HIGH DOSE SEASONAL PF 12/27/2016, 01/08/2022   Meningococcal Mcv4o 01/29/2016   Pneumococcal Conjugate-13 05/08/2014   Pneumococcal Polysaccharide-23 06/27/2012   Zoster Recombinant(Shingrix) 09/08/2022, 09/08/2022, 10/06/2022, 12/15/2022    Past Medical History:  Diagnosis Date   Allergy     Anemia    hx   Angio-edema    Arthritis    Diabetes mellitus without complication (HCC)    Type II   Dysrhythmia    Hx: of palpitations in 2010 only   GERD (gastroesophageal reflux disease)    Hypertension    Pancreatic mass    /notes 02/25/2016    Pneumonia 2025   PONV (postoperative nausea and vomiting)    pt reports no PONV after biopsy on 12/24/15   Right knee DJD    Stress incontinence    Urinary frequency    Urinary urgency    Wears glasses     Tobacco History: Social History   Tobacco Use  Smoking Status Never  Smokeless Tobacco Never   Counseling given: Not Answered   Continue to not smoke  Outpatient Encounter Medications as of 01/19/2024  Medication Sig   atorvastatin  (LIPITOR) 40 MG tablet TAKE 1 TABLET BY MOUTH EVERY DAY   Cholecalciferol (VITAMIN D3) 2000 units TABS Take 2,000 Units by mouth daily.    glipiZIDE  (GLUCOTROL  XL) 5 MG 24 hr tablet TAKE 1 TABLET BY MOUTH EVERY DAY WITH BREAKFAST   JANUMET  50-500  MG tablet TAKE 1 TABLET BY MOUTH TWICE A DAY WITH A MEAL   Multiple Vitamins-Minerals (HAIR SKIN & NAILS ADVANCED PO) Take 1 tablet by mouth daily.   ibuprofen  (ADVIL ) 200 MG tablet Take 3-4 tablets (600-800mg  every 8 hrs as needed)   No facility-administered encounter medications on file as of 01/19/2024.     Review of Systems  Review of Systems  N/a Physical Exam  BP 138/60   Pulse 74   Ht 5' 5 (1.651 m) Comment: per pt  Wt 157 lb 6.4 oz (71.4 kg)   SpO2 96%   BMI 26.19 kg/m   Wt Readings from Last 5 Encounters:  01/19/24 157 lb 6.4 oz (71.4 kg)  01/09/24 155 lb (70.3 kg)  12/26/23 157 lb 6.4 oz (71.4 kg)  12/15/23 155 lb 4 oz (70.4 kg)  11/30/23 158 lb (71.7 kg)    BMI Readings from Last 5 Encounters:  01/19/24 26.19 kg/m  01/09/24 25.79 kg/m  12/26/23 26.19 kg/m  12/15/23 25.83 kg/m  11/30/23 26.29 kg/m     Physical Exam General: In exam room, in chair, no distress Eyes: No icterus Neck: No JVD Pulmonary: Clear, normal work of breathing Cardiovascular: Regular rate and rhythm, no murmur Abdomen: Nondistended Neuro: Normal gait, no focal deficit Psych: Normal mood, full affect   Assessment & Plan:   Lung nodule: Approximate 1 x 1 cm right upper lobe.   Present on chest x-ray 4/25 and recent pneumonia symptoms.  Present June 2025 on CT scan.  Repeat CT scan 11/2023 unchanged.  Underwent bronchoscopic biopsy 12/2023, nondiagnostic.  Complicated postprocedural pneumothorax.  Will repeat CT scan scan in 6 months for ongoing surveillance, scan ordered.  Postprocedural pneumothorax: Repeat chest x-ray today to assess for ongoing improvement and hopeful interval resolution, tiny pneumothorax persisted on most recent chest x-ray in the hospital.   Return in about 5 months (around 06/18/2024) for f/u Dr. Annella, after CT scan.   Donnice JONELLE Annella, MD 01/19/2024

## 2024-01-19 NOTE — Patient Instructions (Addendum)
 Nice to see you again  Will get a chest x-ray today,  Will repeat a CT scan in March and follow-up to discuss results after that  Okay to go back to work on Monday  Return to clinic in 5 months after CT scan with Dr. Annella.

## 2024-02-08 ENCOUNTER — Ambulatory Visit: Admitting: *Deleted

## 2024-02-08 VITALS — Ht 65.0 in | Wt 157.0 lb

## 2024-02-08 DIAGNOSIS — Z0000 Encounter for general adult medical examination without abnormal findings: Secondary | ICD-10-CM | POA: Diagnosis not present

## 2024-02-08 NOTE — Progress Notes (Signed)
 Chief Complaint  Patient presents with   Medicare Wellness     Subjective:   Tracy Williams is a 78 y.o. female who presents for a Medicare Annual Wellness Visit.  Allergies (verified) Lisinopril , Metformin  and related, No healthtouch food allergies, Darvocet [propoxyphene n-acetaminophen ], Demerol [meperidine], Hydrochlorothiazide, Percocet [oxycodone -acetaminophen ], and Vicodin [hydrocodone-acetaminophen ]   History: Past Medical History:  Diagnosis Date   Allergy     Anemia    hx   Angio-edema    Arthritis    Diabetes mellitus without complication (HCC)    Type II   Dysrhythmia    Hx: of palpitations in 2010 only   GERD (gastroesophageal reflux disease)    Hypertension    Pancreatic mass    /notes 02/25/2016   Pneumonia 2025   PONV (postoperative nausea and vomiting)    pt reports no PONV after biopsy on 12/24/15   Right knee DJD    Stress incontinence    Urinary frequency    Urinary urgency    Wears glasses    Past Surgical History:  Procedure Laterality Date   CESAREAN SECTION  1975   CHEST TUBE INSERTION Right 01/09/2024   Procedure: CHEST TUBE INSERTION;  Surgeon: Shelah Lamar RAMAN, MD;  Location: [facility];  Service: Pulmonary;  Laterality: Right;   COLONOSCOPY W/ BIOPSIES AND POLYPECTOMY     Hx: of in 2012   DILATION AND CURETTAGE OF UTERUS     Hx: of 1978   EUS N/A 12/24/2015   Procedure: UPPER ENDOSCOPIC ULTRASOUND (EUS) RADIAL;  Surgeon: Elsie Cree, MD;  Location: [facility];  Service: Endoscopy;  Laterality: N/A;   FINE NEEDLE ASPIRATION N/A 12/24/2015   Procedure: FINE NEEDLE ASPIRATION (FNA) RADIAL;  Surgeon: Elsie Cree, MD;  Location: [facility];  Service: Endoscopy;  Laterality: N/A;   JOINT REPLACEMENT     Hx: of left Knee 2010   LAPAROSCOPIC DISTAL PANCREATECTOMY  02/25/2016   Diagnostic laparoscopy, laparoscopic hand-assisted    LAPAROSCOPIC SPLENECTOMY N/A 02/25/2016   Procedure: LAPAROSCOPIC SPLENECTOMY;  Surgeon: Jina Nephew, MD;  Location: [facility];  Service: General;  Laterality: N/A;   PANCREATECTOMY N/A 02/25/2016   Procedure: LAPAROSCOPIC DISTAL PANCREATECTOMY;  Surgeon: Jina Nephew, MD;  Location: [facility];  Service: General;  Laterality: N/A;   SPLENECTOMY  02/25/2016   TOTAL KNEE ARTHROPLASTY  2010   TOTAL KNEE ARTHROPLASTY Right 06/26/2012   Dr Jane   TOTAL KNEE ARTHROPLASTY Right 06/26/2012   Procedure: TOTAL KNEE ARTHROPLASTY- right;  Surgeon: Lamar DELENA Jane, MD;  Location: [facility];  Service: Orthopedics;  Laterality: Right;   VIDEO BRONCHOSCOPY WITH ENDOBRONCHIAL NAVIGATION N/A 01/09/2024   Procedure: VIDEO BRONCHOSCOPY WITH ENDOBRONCHIAL NAVIGATION;  Surgeon: Shelah Lamar RAMAN, MD;  Location: [facility];  Service: Pulmonary;  Laterality: N/A;   Family History  Problem Relation Age of Onset   Diabetes Mellitus II Father    Hypertension Father    Diabetes Father    Cancer - Other Mother        Breast   Breast cancer Mother 60   Cancer Mother    Heart disease Maternal Grandfather    Cancer Maternal Grandmother    Diabetes Paternal Grandmother    Social History   Occupational History   Occupation: retired  Tobacco Use   Smoking status: Never   Smokeless tobacco: Never  Vaping Use   Vaping status: Never Used  Substance and Sexual Activity   Alcohol use: No   Drug use: No   Sexual activity: Not Currently  Tobacco Counseling Counseling given: Not Answered  SDOH Screenings   Food Insecurity: No Food Insecurity (02/08/2024)  Housing: Unknown (02/08/2024)  Transportation Needs: No Transportation Needs (02/08/2024)  Utilities: Not At Risk (02/08/2024)  Alcohol Screen: Low Risk  (11/10/2022)  Depression (PHQ2-9): Low Risk  (02/08/2024)  Financial Resource Strain: Low Risk  (12/14/2023)  Physical Activity: Inactive (02/08/2024)  Social Connections: Moderately Integrated (02/08/2024)  Stress: No Stress Concern Present (02/08/2024)  Tobacco Use: Low Risk  (02/08/2024)  Health Literacy:  Adequate Health Literacy (02/08/2024)   See flowsheets for full screening details  Depression Screen PHQ 2 & 9 Depression Scale- Over the past 2 weeks, how often have you been bothered by any of the following problems? Little interest or pleasure in doing things: 0 Feeling down, depressed, or hopeless (PHQ Adolescent also includes...irritable): 0 PHQ-2 Total Score: 0 Trouble falling or staying asleep, or sleeping too much: 0 Feeling tired or having little energy: 0 Poor appetite or overeating (PHQ Adolescent also includes...weight loss): 0 Feeling bad about yourself - or that you are a failure or have let yourself or your family down: 0 Trouble concentrating on things, such as reading the newspaper or watching television (PHQ Adolescent also includes...like school work): 0 Moving or speaking so slowly that other people could have noticed. Or the opposite - being so fidgety or restless that you have been moving around a lot more than usual: 0 Thoughts that you would be better off dead, or of hurting yourself in some way: 0 PHQ-9 Total Score: 0 If you checked off any problems, how difficult have these problems made it for you to do your work, take care of things at home, or get along with other people?: Not difficult at all     Goals Addressed             This Visit's Progress    Weight (lb) < 200 lb (90.7 kg)   157 lb (71.2 kg)      Visit info / Clinical Intake: Medicare Wellness Visit Type:: Subsequent Annual Wellness Visit Persons participating in visit:: patient Medicare Wellness Visit Mode:: Telephone If telephone:: video declined Because this visit was a virtual/telehealth visit:: unable to obtan vitals due to lack of equipment If Telephone or Video please confirm:: I connected with the patient using audio enabled telemedicine application and verified that I am speaking with the correct person using two identifiers; I discussed the limitations of evaluation and management by  telemedicine; The patient expressed understanding and agreed to proceed Patient Location:: home Provider Location:: home Information given by:: patient Interpreter Needed?: No Pre-visit prep was completed: no AWV questionnaire completed by patient prior to visit?: no Living arrangements:: (!) lives alone Patient's Overall Health Status Rating: good Typical amount of pain: none Does pain affect daily life?: no Are you currently prescribed opioids?: no  Dietary Habits and Nutritional Risks How many meals a day?: 2 Eats fruit and vegetables daily?: yes Most meals are obtained by: preparing own meals; eating out In the last 2 weeks, have you had any of the following?: none Diabetic:: (!) yes Any non-healing wounds?: no How often do you check your BS?: continuous glucose monitor Would you like to be referred to a Nutritionist or for Diabetic Management? : no  Functional Status Activities of Daily Living (to include ambulation/medication): Independent Ambulation: Independent Medication Administration: Independent Home Management: Independent Manage your own finances?: yes Primary transportation is: driving Concerns about vision?: no *vision screening is required for WTM* Concerns about hearing?:  no  Fall Screening Falls in the past year?: 1 Number of falls in past year: 0 Was there an injury with Fall?: 0 Fall Risk Category Calculator: 1 Patient Fall Risk Level: Low Fall Risk  Fall Risk Patient at Risk for Falls Due to: No Fall Risks Fall risk Follow up: Falls evaluation completed; Education provided; Falls prevention discussed  Home and Transportation Safety: All rugs have non-skid backing?: yes All stairs or steps have railings?: yes Grab bars in the bathtub or shower?: yes Have non-skid surface in bathtub or shower?: yes Good home lighting?: yes Regular seat belt use?: yes Hospital stays in the last year:: (!) yes How many hospital stays:: 1 Reason: collapsed lung   after biopsy  Cognitive Assessment Difficulty concentrating, remembering, or making decisions? : no Will 6CIT or Mini Cog be Completed: yes What year is it?: 0 points What month is it?: 0 points Give patient an address phrase to remember (5 components): It is very sunny outside today in November About what time is it?: 0 points Count backwards from 20 to 1: 0 points Say the months of the year in reverse: 0 points Repeat the address phrase from earlier: 0 points 6 CIT Score: 0 points  Advance Directives (For Healthcare) Does Patient Have a Medical Advance Directive?: No Would patient like information on creating a medical advance directive?: No - Patient declined  Reviewed/Updated  Reviewed/Updated: Reviewed All (Medical, Surgical, Family, Medications, Allergies, Care Teams, Patient Goals); Surgical History; Family History; Medications; Care Teams; Allergies; Patient Goals; Medical History        Objective:    Today's Vitals   02/08/24 1502  Weight: 157 lb (71.2 kg)  Height: 5' 5 (1.651 m)   Body mass index is 26.13 kg/m.  Current Medications (verified) Outpatient Encounter Medications as of 02/08/2024  Medication Sig   atorvastatin  (LIPITOR) 40 MG tablet TAKE 1 TABLET BY MOUTH EVERY DAY   Cholecalciferol  (VITAMIN D3) 2000 units TABS Take 2,000 Units by mouth daily.    glipiZIDE  (GLUCOTROL  XL) 5 MG 24 hr tablet TAKE 1 TABLET BY MOUTH EVERY DAY WITH BREAKFAST   JANUMET  50-500 MG tablet TAKE 1 TABLET BY MOUTH TWICE A DAY WITH A MEAL   Multiple Vitamins-Minerals (HAIR SKIN & NAILS ADVANCED PO) Take 1 tablet by mouth daily.   No facility-administered encounter medications on file as of 02/08/2024.   Hearing/Vision screen Hearing Screening - Comments:: No trouble hearing Vision Screening - Comments:: Robinson Up to date Immunizations and Health Maintenance Health Maintenance  Topic Date Due   DTaP/Tdap/Td (1 - Tdap) Never done   FOOT EXAM  09/24/2023   OPHTHALMOLOGY EXAM   12/23/2023   Mammogram  12/14/2023   COVID-19 Vaccine (1) 02/24/2024 (Originally 12/05/1950)   HEMOGLOBIN A1C  06/13/2024   Diabetic kidney evaluation - Urine ACR  07/31/2024   Diabetic kidney evaluation - eGFR measurement  12/14/2024   Medicare Annual Wellness (AWV)  02/07/2025   Pneumococcal Vaccine: 50+ Years  Completed   Influenza Vaccine  Completed   Bone Density Scan  Completed   Hepatitis C Screening  Completed   Meningococcal B Vaccine  Aged Out   Colonoscopy  Discontinued   Zoster Vaccines- Shingrix  Discontinued        Assessment/Plan:  This is a routine wellness examination for Dorleen.  Patient Care Team: Mahlon Comer BRAVO, MD as PCP - General (Family Medicine) Rosalie Kitchens, MD as Consulting Physician (Gastroenterology) Jane Charleston, MD as Consulting Physician (Orthopedic Surgery) Claudene Victory ORN,  MD (Inactive) as Consulting Physician (Cardiology) Robinson Idol, MD as Consulting Physician (Ophthalmology) Nicholaus Sherlean CROME, [facility] (Inactive) (Pharmacist)  I have personally reviewed and noted the following in the patient's chart:   Medical and social history Use of alcohol, tobacco or illicit drugs  Current medications and supplements including opioid prescriptions. Functional ability and status Nutritional status Physical activity Advanced directives List of other physicians Hospitalizations, surgeries, and ER visits in previous 12 months Vitals Screenings to include cognitive, depression, and falls Referrals and appointments  No orders of the defined types were placed in this encounter.  In addition, I have reviewed and discussed with patient certain preventive protocols, quality metrics, and best practice recommendations. A written personalized care plan for preventive services as well as general preventive health recommendations were provided to patient.   Mliss Graff, LPN   [id number]/[id number]   Return in 1 year (on 02/07/2025).  After Visit Summary:    Nurse Notes:

## 2024-02-08 NOTE — Patient Instructions (Signed)
 Ms. Tracy Williams,  Thank you for taking the time for your Medicare Wellness Visit. I appreciate your continued commitment to your health goals. Please review the care plan we discussed, and feel free to reach out if I can assist you further.  Please note that Annual Wellness Visits do not include a physical exam. Some assessments may be limited, especially if the visit was conducted virtually. If needed, we may recommend an in-person follow-up with your provider.  Ongoing Care Seeing your primary care provider every 3 to 6 months helps us  monitor your health and provide consistent, personalized care.   Referrals If a referral was made during today's visit and you haven't received any updates within two weeks, please contact the referred provider directly to check on the status.  Recommended Screenings:  Health Maintenance  Topic Date Due   DTaP/Tdap/Td vaccine (1 - Tdap) Never done   Complete foot exam   09/24/2023   Eye exam for diabetics  12/23/2023   Breast Cancer Screening  12/14/2023   COVID-19 Vaccine (1) 02/24/2024*   Hemoglobin A1C  06/13/2024   Yearly kidney health urinalysis for diabetes  07/31/2024   Yearly kidney function blood test for diabetes  12/14/2024   Medicare Annual Wellness Visit  02/07/2025   Pneumococcal Vaccine for age over 80  Completed   Flu Shot  Completed   Osteoporosis screening with Bone Density Scan  Completed   Hepatitis C Screening  Completed   Meningitis B Vaccine  Aged Out   Colon Cancer Screening  Discontinued   Zoster (Shingles) Vaccine  Discontinued  *Topic was postponed. The date shown is not the original due date.       02/08/2024    3:04 PM  Advanced Directives  Does Patient Have a Medical Advance Directive? No  Would patient like information on creating a medical advance directive? No - Patient declined    Vision: Annual vision screenings are recommended for early detection of glaucoma, cataracts, and diabetic retinopathy. These exams  can also reveal signs of chronic conditions such as diabetes and high blood pressure.  Dental: Annual dental screenings help detect early signs of oral cancer, gum disease, and other conditions linked to overall health, including heart disease and diabetes.  Please see the attached documents for additional preventive care recommendations.    Ms. Tracy Williams , Thank you for taking time to come for your Medicare Wellness Visit. I appreciate your ongoing commitment to your health goals. Please review the following plan we discussed and let me know if I can assist you in the future.   Screening recommendations/referrals: Colonoscopy:  Mammogram:  Bone Density:  Recommended yearly ophthalmology/optometry visit for glaucoma screening and checkup Recommended yearly dental visit for hygiene and checkup  Vaccinations: Influenza vaccine:  Pneumococcal vaccine:  Tdap vaccine:  Shingles vaccine:         Preventive Care 65 Years and Older, Female Preventive care refers to lifestyle choices and visits with your health care provider that can promote health and wellness. What does preventive care include? A yearly physical exam. This is also called an annual well check. Dental exams once or twice a year. Routine eye exams. Ask your health care provider how often you should have your eyes checked. Personal lifestyle choices, including: Daily care of your teeth and gums. Regular physical activity. Eating a healthy diet. Avoiding tobacco and drug use. Limiting alcohol use. Practicing safe sex. Taking low-dose aspirin every day. Taking vitamin and mineral supplements as recommended by your health care  provider. What happens during an annual well check? The services and screenings done by your health care provider during your annual well check will depend on your age, overall health, lifestyle risk factors, and family history of disease. Counseling  Your health care provider may ask you questions  about your: Alcohol use. Tobacco use. Drug use. Emotional well-being. Home and relationship well-being. Sexual activity. Eating habits. History of falls. Memory and ability to understand (cognition). Work and work astronomer. Reproductive health. Screening  You may have the following tests or measurements: Height, weight, and BMI. Blood pressure. Lipid and cholesterol levels. These may be checked every 5 years, or more frequently if you are over 62 years old. Skin check. Lung cancer screening. You may have this screening every year starting at age 48 if you have a 30-pack-year history of smoking and currently smoke or have quit within the past 15 years. Fecal occult blood test (FOBT) of the stool. You may have this test every year starting at age 78. Flexible sigmoidoscopy or colonoscopy. You may have a sigmoidoscopy every 5 years or a colonoscopy every 10 years starting at age 109. Hepatitis C blood test. Hepatitis B blood test. Sexually transmitted disease (STD) testing. Diabetes screening. This is done by checking your blood sugar (glucose) after you have not eaten for a while (fasting). You may have this done every 1-3 years. Bone density scan. This is done to screen for osteoporosis. You may have this done starting at age 61. Mammogram. This may be done every 1-2 years. Talk to your health care provider about how often you should have regular mammograms. Talk with your health care provider about your test results, treatment options, and if necessary, the need for more tests. Vaccines  Your health care provider may recommend certain vaccines, such as: Influenza vaccine. This is recommended every year. Tetanus, diphtheria, and acellular pertussis (Tdap, Td) vaccine. You may need a Td booster every 10 years. Zoster vaccine. You may need this after age 74. Pneumococcal 13-valent conjugate (PCV13) vaccine. One dose is recommended after age 57. Pneumococcal polysaccharide (PPSV23)  vaccine. One dose is recommended after age 65. Talk to your health care provider about which screenings and vaccines you need and how often you need them. This information is not intended to replace advice given to you by your health care provider. Make sure you discuss any questions you have with your health care provider. Document Released: 04/04/2015 Document Revised: 11/26/2015 Document Reviewed: 01/07/2015 Elsevier Interactive Patient Education  2017 [employer].  Fall Prevention in the Home Falls can cause injuries. They can happen to people of all ages. There are many things you can do to make your home safe and to help prevent falls. What can I do on the outside of my home? Regularly fix the edges of walkways and driveways and fix any cracks. Remove anything that might make you trip as you walk through a door, such as a raised step or threshold. Trim any bushes or trees on the path to your home. Use bright outdoor lighting. Clear any walking paths of anything that might make someone trip, such as rocks or tools. Regularly check to see if handrails are loose or broken. Make sure that both sides of any steps have handrails. Any raised decks and porches should have guardrails on the edges. Have any leaves, snow, or ice cleared regularly. Use sand or salt on walking paths during winter. Clean up any spills in your garage right away. This includes oil or  grease spills. What can I do in the bathroom? Use night lights. Install grab bars by the toilet and in the tub and shower. Do not use towel bars as grab bars. Use non-skid mats or decals in the tub or shower. If you need to sit down in the shower, use a plastic, non-slip stool. Keep the floor dry. Clean up any water that spills on the floor as soon as it happens. Remove soap buildup in the tub or shower regularly. Attach bath mats securely with double-sided non-slip rug tape. Do not have throw rugs and other things on the floor that  can make you trip. What can I do in the bedroom? Use night lights. Make sure that you have a light by your bed that is easy to reach. Do not use any sheets or blankets that are too big for your bed. They should not hang down onto the floor. Have a firm chair that has side arms. You can use this for support while you get dressed. Do not have throw rugs and other things on the floor that can make you trip. What can I do in the kitchen? Clean up any spills right away. Avoid walking on wet floors. Keep items that you use a lot in easy-to-reach places. If you need to reach something above you, use a strong step stool that has a grab bar. Keep electrical cords out of the way. Do not use floor polish or wax that makes floors slippery. If you must use wax, use non-skid floor wax. Do not have throw rugs and other things on the floor that can make you trip. What can I do with my stairs? Do not leave any items on the stairs. Make sure that there are handrails on both sides of the stairs and use them. Fix handrails that are broken or loose. Make sure that handrails are as long as the stairways. Check any carpeting to make sure that it is firmly attached to the stairs. Fix any carpet that is loose or worn. Avoid having throw rugs at the top or bottom of the stairs. If you do have throw rugs, attach them to the floor with carpet tape. Make sure that you have a light switch at the top of the stairs and the bottom of the stairs. If you do not have them, ask someone to add them for you. What else can I do to help prevent falls? Wear shoes that: Do not have high heels. Have rubber bottoms. Are comfortable and fit you well. Are closed at the toe. Do not wear sandals. If you use a stepladder: Make sure that it is fully opened. Do not climb a closed stepladder. Make sure that both sides of the stepladder are locked into place. Ask someone to hold it for you, if possible. Clearly mark and make sure that you  can see: Any grab bars or handrails. First and last steps. Where the edge of each step is. Use tools that help you move around (mobility aids) if they are needed. These include: Canes. Walkers. Scooters. Crutches. Turn on the lights when you go into a dark area. Replace any light bulbs as soon as they burn out. Set up your furniture so you have a clear path. Avoid moving your furniture around. If any of your floors are uneven, fix them. If there are any pets around you, be aware of where they are. Review your medicines with your doctor. Some medicines can make you feel dizzy. This can increase  your chance of falling. Ask your doctor what other things that you can do to help prevent falls. This information is not intended to replace advice given to you by your health care provider. Make sure you discuss any questions you have with your health care provider. Document Released: 01/02/2009 Document Revised: 08/14/2015 Document Reviewed: 04/12/2014 Elsevier Interactive Patient Education  2017 [employer].

## 2024-02-09 LAB — FUNGUS CULTURE WITH STAIN

## 2024-02-09 LAB — FUNGAL ORGANISM REFLEX

## 2024-02-09 LAB — FUNGUS CULTURE RESULT

## 2024-02-23 LAB — ACID FAST CULTURE WITH REFLEXED SENSITIVITIES (MYCOBACTERIA): Acid Fast Culture: NEGATIVE

## 2024-03-26 ENCOUNTER — Encounter: Payer: Medicare HMO | Admitting: Family Medicine

## 2024-03-29 ENCOUNTER — Encounter: Payer: Medicare HMO | Admitting: Family Medicine

## 2024-04-18 ENCOUNTER — Ambulatory Visit: Admitting: Family Medicine

## 2024-04-27 ENCOUNTER — Ambulatory Visit: Admitting: Family Medicine

## 2024-04-30 ENCOUNTER — Ambulatory Visit: Admitting: Family Medicine

## 2024-08-06 ENCOUNTER — Encounter: Admitting: Family Medicine
